# Patient Record
Sex: Female | Born: 1940 | Race: White | Hispanic: No | Smoking: Never smoker
Health system: Southern US, Community
[De-identification: ages and names within clinical notes are randomized; demographics above are authoritative.]

## PROBLEM LIST (undated history)

## (undated) ENCOUNTER — Inpatient Hospital Stay: Admission: EM | Payer: Self-pay | Source: Home / Self Care

## (undated) DIAGNOSIS — I999 Unspecified disorder of circulatory system: Secondary | ICD-10-CM

## (undated) DIAGNOSIS — I639 Cerebral infarction, unspecified: Secondary | ICD-10-CM

## (undated) DIAGNOSIS — E119 Type 2 diabetes mellitus without complications: Secondary | ICD-10-CM

## (undated) DIAGNOSIS — R569 Unspecified convulsions: Secondary | ICD-10-CM

## (undated) DIAGNOSIS — I10 Essential (primary) hypertension: Secondary | ICD-10-CM

## (undated) DIAGNOSIS — Z8619 Personal history of other infectious and parasitic diseases: Secondary | ICD-10-CM

## (undated) DIAGNOSIS — E785 Hyperlipidemia, unspecified: Secondary | ICD-10-CM

## (undated) DIAGNOSIS — E1149 Type 2 diabetes mellitus with other diabetic neurological complication: Secondary | ICD-10-CM

## (undated) DIAGNOSIS — C3490 Malignant neoplasm of unspecified part of unspecified bronchus or lung: Secondary | ICD-10-CM

## (undated) DIAGNOSIS — E1165 Type 2 diabetes mellitus with hyperglycemia: Principal | ICD-10-CM

## (undated) DIAGNOSIS — N189 Chronic kidney disease, unspecified: Secondary | ICD-10-CM

## (undated) DIAGNOSIS — M7989 Other specified soft tissue disorders: Secondary | ICD-10-CM

## (undated) DIAGNOSIS — K219 Gastro-esophageal reflux disease without esophagitis: Secondary | ICD-10-CM

## (undated) HISTORY — DX: Type 2 diabetes mellitus with other diabetic neurological complication: E11.49

## (undated) HISTORY — DX: Personal history of other infectious and parasitic diseases: Z86.19

## (undated) HISTORY — DX: Type 2 diabetes mellitus with hyperglycemia: E11.65

## (undated) HISTORY — PX: CATARACT EXTRACTION, BILATERAL: SHX1313

## (undated) HISTORY — DX: Essential (primary) hypertension: I10

## (undated) HISTORY — PX: UPPER GI ENDOSCOPY: SHX6162

## (undated) HISTORY — DX: Unspecified convulsions: R56.9

## (undated) HISTORY — PX: COLONOSCOPY: SHX174

## (undated) HISTORY — DX: Hyperlipidemia, unspecified: E78.5

## (undated) HISTORY — DX: Cerebral infarction, unspecified: I63.9

## (undated) HISTORY — DX: Type 2 diabetes mellitus without complications: E11.9

---

## 1978-07-06 HISTORY — PX: ABDOMINAL HYSTERECTOMY: SHX81

## 1978-07-06 HISTORY — PX: BLADDER SUSPENSION: SHX72

## 2003-07-07 DIAGNOSIS — R569 Unspecified convulsions: Secondary | ICD-10-CM

## 2003-07-07 HISTORY — DX: Unspecified convulsions: R56.9

## 2005-07-06 HISTORY — PX: APPENDECTOMY: SHX54

## 2005-07-06 HISTORY — PX: CHOLECYSTECTOMY: SHX55

## 2009-07-06 DIAGNOSIS — I639 Cerebral infarction, unspecified: Secondary | ICD-10-CM

## 2009-07-06 HISTORY — DX: Cerebral infarction, unspecified: I63.9

## 2010-07-06 HISTORY — PX: SPINE SURGERY: SHX786

## 2010-09-01 ENCOUNTER — Encounter (HOSPITAL_COMMUNITY)
Admission: RE | Admit: 2010-09-01 | Discharge: 2010-09-01 | Disposition: A | Discharge status: Home / Self Care | Payer: Medicare Other | Source: Ambulatory Visit | Attending: Neurosurgery | Admitting: Neurosurgery

## 2010-09-01 ENCOUNTER — Other Ambulatory Visit (HOSPITAL_COMMUNITY): Payer: Self-pay | Admitting: Neurosurgery

## 2010-09-01 ENCOUNTER — Ambulatory Visit (HOSPITAL_COMMUNITY)
Admission: RE | Admit: 2010-09-01 | Discharge: 2010-09-01 | Disposition: A | Discharge status: Home / Self Care | Payer: Medicare Other | Source: Ambulatory Visit | Attending: Neurosurgery | Admitting: Neurosurgery

## 2010-09-01 DIAGNOSIS — Z01810 Encounter for preprocedural cardiovascular examination: Secondary | ICD-10-CM | POA: Insufficient documentation

## 2010-09-01 DIAGNOSIS — I10 Essential (primary) hypertension: Secondary | ICD-10-CM | POA: Insufficient documentation

## 2010-09-01 DIAGNOSIS — M5416 Radiculopathy, lumbar region: Secondary | ICD-10-CM

## 2010-09-01 DIAGNOSIS — Z01812 Encounter for preprocedural laboratory examination: Secondary | ICD-10-CM | POA: Insufficient documentation

## 2010-09-01 DIAGNOSIS — IMO0002 Reserved for concepts with insufficient information to code with codable children: Secondary | ICD-10-CM | POA: Insufficient documentation

## 2010-09-01 LAB — BASIC METABOLIC PANEL
BUN: 22 mg/dL (ref 6–23)
CO2: 29 mEq/L (ref 19–32)
Calcium: 10.1 mg/dL (ref 8.4–10.5)
Chloride: 101 mEq/L (ref 96–112)
Creatinine, Ser: 1.5 mg/dL — ABNORMAL HIGH (ref 0.4–1.2)
GFR calc Af Amer: 42 mL/min — ABNORMAL LOW (ref >60)
GFR calc non Af Amer: 34 mL/min — ABNORMAL LOW (ref >60)
Glucose, Bld: 175 mg/dL — ABNORMAL HIGH (ref 70–99)
Potassium: 4.2 mEq/L (ref 3.5–5.1)
Sodium: 139 mEq/L (ref 135–145)

## 2010-09-01 LAB — CBC
HCT: 30.7 % — ABNORMAL LOW (ref 36.0–46.0)
Hemoglobin: 10.4 g/dL — ABNORMAL LOW (ref 12.0–15.0)
MCH: 28.5 pg (ref 26.0–34.0)
MCHC: 33.9 g/dL (ref 30.0–36.0)
MCV: 84.1 fL (ref 78.0–100.0)
Platelets: 375 10*3/uL (ref 150–400)
RBC: 3.65 MIL/uL — ABNORMAL LOW (ref 3.87–5.11)
RDW: 13.4 % (ref 11.5–15.5)
WBC: 9.4 10*3/uL (ref 4.0–10.5)

## 2010-09-01 LAB — SURGICAL PCR SCREEN
MRSA, PCR: NEGATIVE
Staphylococcus aureus: NEGATIVE

## 2010-09-03 ENCOUNTER — Inpatient Hospital Stay (HOSPITAL_COMMUNITY): Payer: Medicare Other

## 2010-09-03 ENCOUNTER — Inpatient Hospital Stay (HOSPITAL_COMMUNITY)
Admission: RE | Admit: 2010-09-03 | Discharge: 2010-09-08 | DRG: 460 | Disposition: A | Discharge status: Home / Self Care | Payer: Medicare Other | Source: Ambulatory Visit | Attending: Neurosurgery | Admitting: Neurosurgery

## 2010-09-03 DIAGNOSIS — M47817 Spondylosis without myelopathy or radiculopathy, lumbosacral region: Principal | ICD-10-CM | POA: Diagnosis present

## 2010-09-03 DIAGNOSIS — Q762 Congenital spondylolisthesis: Secondary | ICD-10-CM

## 2010-09-03 DIAGNOSIS — Z794 Long term (current) use of insulin: Secondary | ICD-10-CM

## 2010-09-03 DIAGNOSIS — R339 Retention of urine, unspecified: Secondary | ICD-10-CM | POA: Diagnosis present

## 2010-09-03 DIAGNOSIS — Z8673 Personal history of transient ischemic attack (TIA), and cerebral infarction without residual deficits: Secondary | ICD-10-CM

## 2010-09-03 DIAGNOSIS — Z7982 Long term (current) use of aspirin: Secondary | ICD-10-CM

## 2010-09-03 DIAGNOSIS — E1169 Type 2 diabetes mellitus with other specified complication: Secondary | ICD-10-CM | POA: Diagnosis present

## 2010-09-03 LAB — TYPE AND SCREEN
ABO/RH(D): B NEG
Antibody Screen: POSITIVE

## 2010-09-03 LAB — GLUCOSE, CAPILLARY
Glucose-Capillary: 216 mg/dL — ABNORMAL HIGH (ref 70–99)
Glucose-Capillary: 208 mg/dL — ABNORMAL HIGH (ref 70–99)
Glucose-Capillary: 203 mg/dL — ABNORMAL HIGH (ref 70–99)
Glucose-Capillary: 230 mg/dL — ABNORMAL HIGH (ref 70–99)
Glucose-Capillary: 147 mg/dL — ABNORMAL HIGH (ref 70–99)

## 2010-09-04 LAB — GLUCOSE, CAPILLARY
Glucose-Capillary: 239 mg/dL — ABNORMAL HIGH (ref 70–99)
Glucose-Capillary: 192 mg/dL — ABNORMAL HIGH (ref 70–99)
Glucose-Capillary: 121 mg/dL — ABNORMAL HIGH (ref 70–99)

## 2010-09-04 LAB — DIFFERENTIAL
Basophils Absolute: 0 10*3/uL (ref 0.0–0.1)
Basophils Relative: 0 % (ref 0–1)
Eosinophils Absolute: 0.1 10*3/uL (ref 0.0–0.7)
Eosinophils Relative: 1 % (ref 0–5)
Lymphocytes Relative: 7 % — ABNORMAL LOW (ref 12–46)
Lymphs Abs: 0.8 10*3/uL (ref 0.7–4.0)
Monocytes Absolute: 0.9 10*3/uL (ref 0.1–1.0)
Monocytes Relative: 8 % (ref 3–12)
Neutro Abs: 10.2 10*3/uL — ABNORMAL HIGH (ref 1.7–7.7)
Neutrophils Relative %: 85 % — ABNORMAL HIGH (ref 43–77)

## 2010-09-04 LAB — CBC
HCT: 27 % — ABNORMAL LOW (ref 36.0–46.0)
Hemoglobin: 8.8 g/dL — ABNORMAL LOW (ref 12.0–15.0)
MCH: 27.8 pg (ref 26.0–34.0)
MCHC: 32.6 g/dL (ref 30.0–36.0)
MCV: 85.4 fL (ref 78.0–100.0)
Platelets: 244 10*3/uL (ref 150–400)
RBC: 3.16 MIL/uL — ABNORMAL LOW (ref 3.87–5.11)
RDW: 13.7 % (ref 11.5–15.5)
WBC: 12 10*3/uL — ABNORMAL HIGH (ref 4.0–10.5)

## 2010-09-04 NOTE — Op Note (Signed)
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  PROCEDURE:  L3-L5 decompressive lumbar laminectomy, facetectomy, and foraminotomy with decompression of the exiting L3, L4, and L5 nerve roots with decompression beyond that required for interbody arthrodesis with microdissection and microsurgical technique.  Bilateral L3-4 and L4- 5 post lumbar interbody arthrodesis with AVS PEEK interbody implants, Vitoss bone marrow aspirate and infuse, and bilateral L3-L5 posterolateral arthrodesis with radius post instrumentation, Vitoss bone marrow aspirate and infuse.  SURGEON:  Hewitt Shorts, MD  ASSISTANT:  Clydene Fake, MD  ANESTHESIA:  General endotracheal.  INDICATIONS:  The patient is a 70 year old woman who presented with low back and right lumbar radicular pain.  She is found to have advanced degenerative change in the lower lumbar spine, worse at the L3-4 and L4- 5 levels with degenerative spondylolisthesis with resulting severe multifactorial stenosis at both the L3-4 and L4-5 levels.  Decision was made to proceed with decompression and stabilization.  PROCEDURE IN DETAIL:  The patient was brought to the operating room and placed on general endotracheal anesthesia.  The patient was  turned to her prone position.  Lumbar region was prepped with Betadine soap and solution and draped in a sterile fashion.  The midline was infiltrated with local anesthetic with epinephrine and midline incision was made, carried down through the subcutaneous tissue.  Bipolar cautery and electrocautery were used to maintain hemostasis.  Dissection was carried down to the lumbar fascia which was incised bilaterally in the paraspinal muscles from the spinous process and lamina in a subperiosteal fashion.  X-rays were taken.  The L3-4 and L4-5 interlaminar spaces were identified.  We then began the decompression performing a laminectomy using double-action rongeurs, the high-speed drill and Kerrison punches.  Care was taken to leave the underlying structures undisturbed.  The thickened ligamentum flavum was carefully removed, and we were able to decompress the spinal canal, thecal sac, and exiting nerve roots.  The facetectomy was extended laterally bilaterally at each level, and foraminotomy was performed for the exiting nerve roots, and we were able to decompress the L3, L4, and L5 nerve roots bilaterally.  We then identified the annulus.  The spondylolisthesis was noted at each level, worse at L4-5 then at L3-4.  At each level, we incised the annulus bilaterally and proceeded with a thorough diskectomy, spondylotic disk protrusion was removed so as to decompress the nerve roots as they exited through the neural foramen, and then we began to prepare the interbody space for  arthrodesis.  The microcurettes were used to remove the cartilaginous endplate surfaces, and then we measured the height of the intervertebral disk space and selected 8 x 25 x 4 degree implants.  Once the disk space and endplate preparation was completed, we draped the C-arm fluoroscope and was brought into the field, and we identified a pedicle entry point on the left-sided L4.  The pedicle was probed down to the  vertebral body.  We aspirated bone marrow aspirate from the vertebral body, which was injected over two 10 mL strips of Vitoss. Once those were saturated, we packed the PEEK implants with a combination of Vitoss infuse and more Vitoss.  We then first placed the implants at the L4-5, later at L3-4, then L4-5. The thecal sac and nerve root were gently retracted medially.  On the right side, the implant gently tamped into position.  We then went to the opposite side, packed the midline with a combination of infuse and Vitoss with bone marrow aspirate and then placed a second implant from the left side again retracting the thecal sac and nerve root and gently tamping into position.  Under direct vision, both implants were well countersunk.  We then packed some additional Vitoss bone marrow aspirate lateral to the implant on the right side.  The implants of Vitoss and infuse were packed similarly at the L3-4 level using an identical technique.  Once all four implants were in place, we then exposed the transverse process L3, L4, and L5 bilaterally, they were decorticated.  We then identified pedicle entry points for L3 bilaterally, the right-sided L4 and at L5 bilaterally. Once all four pedicles were probed, under C-arm fluoroscopic guidance, they reached tapped with a 5.25-mm tap, examined with a ball probe. Good threading was noted.  No bony cutouts were found and then we placed 5.75 x 40 mm screws bilaterally at L3 and L4 and 5.75 x 35 mm screws bilaterally at L5.  Once all six screws were in place, we then packed the lateral gutter overlying the transverse process of L3, L4, and L5, the intertransverse space at the L3-4 and L4-5 levels bilaterally with a infuse and Vitoss with bone marrow aspirate.  This was gently tamped down against the transverse processes.  We then selected two 50-mm pre- lordosed rods that were placed within the screw heads and secured with six locking caps.  Once  all the locking caps were in place, final tightening was performed against the counter torque.  We then selected a medium crosslink.  It was secured to the rods between the L4 and L5 screws locked down to the rods and then tightened in the middle.  The wound had been irrigated numerous times throughout the procedure with saline solution and bacitracin solution.  Good hemostasis was established, and then we proceeded with closure.  Paraspinal muscle was approximated interrupted undyed 1 Vicryl sutures.  The deep fascia with interrupted undyed 1 Vicryl sutures.  Scarpa fascia was closed with interrupted undyed #1 Vicryl sutures.  The subcutaneous and subcuticular were closed with interrupted inverted 2-0 undyed Vicryl sutures.  The skin was approximated with Dermabond and then some of that was dressed with a dressing sponges and 4-inch Hypafix.  The procedure was tolerated well.  The estimated blood loss was 400 mL.  We were able to return 100 mL of cell saver blood to the patient.  She arrived anemic and during the procedure, the anemia had increasing, and therefore she was given 2 units of packed  red blood cells, which were tolerated well, and her vital signs remained stable through the procedure.  Following surgery, the patient was turned back to supine position, to be reversed from the anesthetic, extubated, and transferred to the recovery room for further care.     Hewitt Shorts, M.D.    RWN/MEDQ  D:  09/03/2010  T:  09/04/2010  Job:  [id number]  Electronically Signed by Shirlean Kelly M.D. on 09/04/2010 07:58:56 AM

## 2010-09-05 LAB — POCT I-STAT 4, (NA,K, GLUC, HGB,HCT)
Glucose, Bld: 193 mg/dL — ABNORMAL HIGH (ref 70–99)
Glucose, Bld: 189 mg/dL — ABNORMAL HIGH (ref 70–99)
Glucose, Bld: 213 mg/dL — ABNORMAL HIGH (ref 70–99)
HCT: 22 % — ABNORMAL LOW (ref 36.0–46.0)
HCT: 22 % — ABNORMAL LOW (ref 36.0–46.0)
HCT: 25 % — ABNORMAL LOW (ref 36.0–46.0)
Hemoglobin: 7.5 g/dL — ABNORMAL LOW (ref 12.0–15.0)
Hemoglobin: 7.5 g/dL — ABNORMAL LOW (ref 12.0–15.0)
Hemoglobin: 8.5 g/dL — ABNORMAL LOW (ref 12.0–15.0)
Potassium: 3.9 mEq/L (ref 3.5–5.1)
Potassium: 4.1 mEq/L (ref 3.5–5.1)
Potassium: 4.7 mEq/L (ref 3.5–5.1)
Sodium: 138 mEq/L (ref 135–145)
Sodium: 138 mEq/L (ref 135–145)
Sodium: 136 mEq/L (ref 135–145)

## 2010-09-05 LAB — GLUCOSE, CAPILLARY
Glucose-Capillary: 96 mg/dL (ref 70–99)
Glucose-Capillary: 146 mg/dL — ABNORMAL HIGH (ref 70–99)
Glucose-Capillary: 86 mg/dL (ref 70–99)
Glucose-Capillary: 117 mg/dL — ABNORMAL HIGH (ref 70–99)
Glucose-Capillary: 60 mg/dL — ABNORMAL LOW (ref 70–99)
Glucose-Capillary: 65 mg/dL — ABNORMAL LOW (ref 70–99)
Glucose-Capillary: 143 mg/dL — ABNORMAL HIGH (ref 70–99)

## 2010-09-06 LAB — URINALYSIS, ROUTINE W REFLEX MICROSCOPIC
Bilirubin Urine: NEGATIVE
Glucose, UA: NEGATIVE mg/dL
Hgb urine dipstick: NEGATIVE
Ketones, ur: NEGATIVE mg/dL
Nitrite: NEGATIVE
Protein, ur: NEGATIVE mg/dL
Specific Gravity, Urine: 1.013 (ref 1.005–1.030)
Urobilinogen, UA: 0.2 mg/dL (ref 0.0–1.0)
pH: 5.5 (ref 5.0–8.0)

## 2010-09-06 LAB — GLUCOSE, CAPILLARY
Glucose-Capillary: 211 mg/dL — ABNORMAL HIGH (ref 70–99)
Glucose-Capillary: 243 mg/dL — ABNORMAL HIGH (ref 70–99)

## 2010-09-07 LAB — TYPE AND SCREEN
ABO/RH(D): B NEG
Antibody Screen: POSITIVE
DAT, IgG: POSITIVE
Unit division: 0
Unit division: 0
Unit division: 0
Unit division: 0
Unit division: 0
Unit division: 0

## 2010-09-07 LAB — GLUCOSE, CAPILLARY
Glucose-Capillary: 142 mg/dL — ABNORMAL HIGH (ref 70–99)
Glucose-Capillary: 128 mg/dL — ABNORMAL HIGH (ref 70–99)
Glucose-Capillary: 114 mg/dL — ABNORMAL HIGH (ref 70–99)
Glucose-Capillary: 152 mg/dL — ABNORMAL HIGH (ref 70–99)
Glucose-Capillary: 178 mg/dL — ABNORMAL HIGH (ref 70–99)

## 2010-09-08 LAB — URINE CULTURE
Colony Count: NO GROWTH
Culture  Setup Time: 201203040108
Culture: NO GROWTH

## 2010-09-08 LAB — GLUCOSE, CAPILLARY
Glucose-Capillary: 160 mg/dL — ABNORMAL HIGH (ref 70–99)
Glucose-Capillary: 154 mg/dL — ABNORMAL HIGH (ref 70–99)
Glucose-Capillary: 158 mg/dL — ABNORMAL HIGH (ref 70–99)

## 2010-09-09 NOTE — Discharge Summary (Signed)
NAME:  Michaela Shaw, Michaela Shaw                   ACCOUNT NO.:  [id number]  MEDICAL RECORD NO.:  [id number]           PATIENT TYPE:  I  LOCATION:  3032                         FACILITY:  [facility]  PHYSICIAN:  Hewitt Shorts, M.D.DATE OF BIRTH:  31-Jul-1940  DATE OF ADMISSION:  09/03/2010 DATE OF DISCHARGE:  09/08/2010                              DISCHARGE SUMMARY   HISTORY AND PHYSICAL EXAMINATION:  The patient is a 70 year old woman with history of low back and right lumbar radicular pain worsening symptoms of the past several years primarily right buttock, lateral hip, thigh and leg pain.  It is the worst pain that was across her back.  She does not describe any radicular symptoms to the left lower extremity. She does not describe any numbness, paresthesias or weakness.  She has been using Advil and Percocet for the pain.  X-ray showed a grade 1-2 dynamic degenerative spondylolisthesis of L3-L4 and a grade 2 dynamic degenerative spondylolisthesis of L4-5 was advanced with facet arthropathy throughout the lower lumbar spine and there was severe multifactorial stenosis at the L3-4 and L4-5 levels.  Past history was notable for diabetes for 8 years, hypertension for 5 years, stroke in July 2011, hypercholesterolemia and anemia.  She denies any history of myocardial function, cancer, peptic ulcer disease or lung disease.  Previous surgeries include 2 bladder surgeries, cholecystectomy and hysterectomy.  No allergies.  Medications are listed in her admission note.  REVIEW OF SYSTEMS:  Regarding bladder function, she had longstanding urinary frequency, voiding several times each hour.  PHYSICAL EXAMINATION:  GENERAL:  This patient is a well-developed, well- nourished white female in no distress. LUNGS:  Clear. HEART:  Regular rate and left.  Normal S1 and S2.  There is no murmur. NEUROLOGIC:  Showed 5/5 strength.  HOSPITAL COURSE:  The patient was admitted, underwent an  L3-L5 decompressive lumbar laminectomy and L3-4 and L4-5 posterior lumbar interbody fusion and posterolateral arthrodesis with interbody implant, post instrumentation and bone graft.  Following surgery, from the neurosurgical perspective, she has done quite well.  She was able to mobilize fairly quickly, was able to don and doff her lumbar brace without difficulty.  Her wound has healed nicely.  She has been afebrile.  However, she has had most significantly difficulties with urinary retention with significant bladder volumes and required in-and- out catheterization.  Eventually, she had to have Foley catheter replaced and the bladder was allowed to rest.  Another voiding trial was performed, still having significant residuals and therefore the Foley was placed again last night.  Urinalysis and urine culture were sent 2 days ago.  Urinalysis was negative.  Urine culture showed no growth after 2 days of final report.  Discussed her case with Dr. Jethro Bolus the urologist on-call today.  He advised placing the Foley to her leg bag and having her follow up with him in his office this week and we have given the patient his name, phone number, and instructions.  As far as her back is concerned, she is to be walking at home every day, avoiding bending, lifting and twisting.  She is  to wear brace when she is up and about.  She is allowed to shower and the glue that closed the incision is to be removed within a week or so.  She has had mild episodes of hypoglycemia, no significant hyperglycemia and she was advised to keep a close eye on her Accu-Cheks and to follow up with her primary physician regarding her blood sugar management.  Discharge prescription was given for Percocet 1-2 tablets p.o. q.i.d. p.r.n. pain, 60 tablets, no refills, and discharge follow up with me is in 3-4 weeks with x-rays in my office.  DISCHARGE DIAGNOSES: 1. Lumbar stenosis, multilevel dynamic  degenerative spondylolisthesis,     lumbar spondylosis, lumbar degenerative disk disease. 2. Urinary retention, probably significant preoperative urinary     retention with overflow voiding. 3. Diabetes with mild hypoglycemic findings at times during this     admission. 4. History of hypertension, stroke, hypercholesterolemia and anemia.  It should be noted the patient did receive 2 units of packed red blood cells at the time of surgery, but has had stable blood counts following surgery.     Hewitt Shorts, M.D.     RWN/MEDQ  D:  09/08/2010  T:  09/09/2010  Job:  [id number]  cc:   Lynelle Smoke I. Patsi Sears, M.D.  Electronically Signed by Shirlean Kelly M.D. on 09/09/2010 07:32:49 AM

## 2010-09-09 NOTE — H&P (Signed)
NAME:  Michaela Shaw, Michaela Shaw NO.:  [id number]  MEDICAL RECORD NO.:  [id number]           PATIENT TYPE:  I  LOCATION:  3033                         FACILITY:  [facility]  PHYSICIAN:  Hewitt Shorts, M.D.DATE OF BIRTH:  1941-01-06  DATE OF ADMISSION:  09/03/2010 DATE OF DISCHARGE:                             HISTORY & PHYSICAL   HISTORY OF PRESENT ILLNESS:  The patient is a 70 year old right-hand white female who is evaluated for low back and right lumbar radicular pain.  She has been having worsening symptoms over the past several years primarily to the right buttock, lateral right hip, thigh, and leg. It is worst pain across the low back.  She does not describe any radicular symptoms of the left lower extremity.  She does not describe any numbness, paresthesias, or weakness.  She has been using Percocet and Advil for the pain.  She was evaluated with x-rays and MRI scan.  Those showed a grade 1-2 degenerative spondylosis of L3 and L4 and grade 2 degenerative spondylosis at L4-5, both are dynamic through flexion and extension, increasing with flexion and diminishing with extension. There is advanced facet arthropathy through the lower lumbar spine.  MRI scan reconfirms the 2-level degenerative spondylolisthesis with severe multifactorial lumbar stenosis of the spinal canal at the L3-4 and L4-5 levels.  The patient is admitted now for decompression and stabilization of the lumbar spine.  PAST MEDICAL HISTORY:  Notable for history of diabetes for the past 8 years, history of hypertension for the past 5 years, history of stroke in July 2011, history of hypercholesterolemia, and a history of anemia. She denies any history of myocardial function, cancer, peptic ulcer disease, or lung disease.  PREVIOUS SURGERY:  Two bladder surgeries, cholecystectomy, and hysterectomy.  ALLERGIES:  She denies allergies to medications.  MEDICATIONS AT TIME OF ADMISSION: 1. Humalog  75/25, 86 units q.a.m. and 48 units q.p.m. 2. Pravachol 80 mg nightly. 3. Aspirin 325 mg daily. 4. Clonidine 0.1 mg b.i.d. 5. Exforge/HCTZ 5/160/25 daily. 6. Lasix 40 mg daily. 7. Fenofibrate 45 mg daily. 8. Alprazolam 20 mg daily. 9. Vitamin B12 1000 mcg IM every month. 10.Lovaza 1 g 4 capsules daily.  FAMILY HISTORY:  Parents passed on.  Mother had a myocardial function and diabetes.  Both parents died in their mid 15s.  SOCIAL HISTORY:  The patient is retired.  She is divorced.  She does not smoke, drink alcoholic beverages, or have history of substance abuse.  REVIEW OF SYSTEMS:  Notable for those described in the history of present illness and past medical history.  Review of systems is otherwise unremarkable.  PHYSICAL EXAMINATION:  GENERAL:  The patient is a well-developed, well- nourished, white female in no acute distress. VITAL SIGNS:  Her temperature is 98, pulse 74, blood pressure 159/81, respiratory rate 20, height 4 feet 11 inches, and weight 160 pounds. LUNGS:  Clear to auscultation.  She has symmetric respiratory excursion. HEART:  Regular rate and rhythm.  Normal S1 and S2.  There is no murmur. EXTREMITIES:  No clubbing, cyanosis, or edema. MUSCULOSKELETAL:  No tenderness to palpation  over the lumbar spinous process or paralumbar musculature.  She is able to flex beyond 90 degrees, able to extend beyond 10 degrees.  Straight leg raising is negative bilaterally. NEUROLOGIC:  5/5 strength in the lower extremities including the iliopsoas, quadriceps, dorsiflexors, extensor hallucis longus, and plantarflexor bilaterally.  Sensation is intact to pinprick.  Through the distal lower extremities, reflexes are 2+ at the quadriceps, minimal at the gastroenteritis that are symmetrical bilaterally.  Toes are downgoing bilaterally.  She has normal gait and stance.  IMPRESSION:  Worsening low back and right lumbar radicular pain with intact strength and sensation, but with  severe degeneration at the L3-4 and L4-5 levels with dynamic degenerative spondylolisthesis at each level with associated severe stenosis at each level.  PLAN:  The patient is admitted for L3-L5 decompressive lumbar laminectomy and L3-4 and L4-5 posterior lumbar interbody arthrodesis and possible arthrodesis with interbody implants, posterior instrumentation, and bone graft.  I discussed the nature of her condition, the nature of the surgical procedure, alternatives to the surgical procedure, typical risks of surgery, hospital stay, and overall recuperation and limitations postoperatively, need for postop immobilization and lumbar brace, and risks including this infection, bleeding, possible need for transfusion, risks of nerve dysfunction, pain, weakness, and paresthesias, the risk of dural tear, CSF leak, possible need for further surgery, the risk of failure of the arthrodesis, and possible need for further surgery, anesthetic risk, myocardial infarction, stroke, and death.  Understanding all this, she wishes to proceed with surgery and is admitted for such.     Hewitt Shorts, M.D.     RWN/MEDQ  D:  09/05/2010  T:  09/05/2010  Job:  [id number]  Electronically Signed by Shirlean Kelly M.D. on 09/09/2010 07:32:46 AM

## 2010-09-10 LAB — GLUCOSE, CAPILLARY: Glucose-Capillary: 270 mg/dL — ABNORMAL HIGH (ref 70–99)

## 2010-09-12 ENCOUNTER — Emergency Department (HOSPITAL_COMMUNITY)
Admission: EM | Admit: 2010-09-12 | Discharge: 2010-09-13 | Disposition: A | Discharge status: Home / Self Care | Payer: Medicare Other | Attending: Emergency Medicine | Admitting: Emergency Medicine

## 2010-09-12 DIAGNOSIS — Z794 Long term (current) use of insulin: Secondary | ICD-10-CM | POA: Insufficient documentation

## 2010-09-12 DIAGNOSIS — I10 Essential (primary) hypertension: Secondary | ICD-10-CM | POA: Insufficient documentation

## 2010-09-12 DIAGNOSIS — E781 Pure hyperglyceridemia: Secondary | ICD-10-CM | POA: Insufficient documentation

## 2010-09-12 DIAGNOSIS — R339 Retention of urine, unspecified: Secondary | ICD-10-CM | POA: Insufficient documentation

## 2010-09-12 DIAGNOSIS — E119 Type 2 diabetes mellitus without complications: Secondary | ICD-10-CM | POA: Insufficient documentation

## 2010-09-12 DIAGNOSIS — E7800 Pure hypercholesterolemia, unspecified: Secondary | ICD-10-CM | POA: Insufficient documentation

## 2010-09-12 DIAGNOSIS — N390 Urinary tract infection, site not specified: Secondary | ICD-10-CM | POA: Insufficient documentation

## 2010-09-12 LAB — URINE MICROSCOPIC-ADD ON

## 2010-09-12 LAB — URINALYSIS, ROUTINE W REFLEX MICROSCOPIC
Bilirubin Urine: NEGATIVE
Glucose, UA: NEGATIVE mg/dL
Nitrite: NEGATIVE
Specific Gravity, Urine: 1.01 (ref 1.005–1.030)
pH: 7 (ref 5.0–8.0)

## 2010-09-15 LAB — URINE CULTURE

## 2013-02-28 ENCOUNTER — Ambulatory Visit: Payer: Medicare Other | Admitting: Family

## 2013-03-03 ENCOUNTER — Ambulatory Visit (INDEPENDENT_AMBULATORY_CARE_PROVIDER_SITE_OTHER): Payer: Medicare Other | Admitting: Family

## 2013-03-03 ENCOUNTER — Encounter: Payer: Self-pay | Admitting: Family

## 2013-03-03 VITALS — BP 150/72 | HR 79 | Temp 97.9°F | Resp 16 | Ht 59.0 in | Wt 157.1 lb

## 2013-03-03 DIAGNOSIS — E119 Type 2 diabetes mellitus without complications: Secondary | ICD-10-CM

## 2013-03-03 DIAGNOSIS — N390 Urinary tract infection, site not specified: Secondary | ICD-10-CM

## 2013-03-03 DIAGNOSIS — E785 Hyperlipidemia, unspecified: Secondary | ICD-10-CM

## 2013-03-03 DIAGNOSIS — Z23 Encounter for immunization: Secondary | ICD-10-CM

## 2013-03-03 DIAGNOSIS — I10 Essential (primary) hypertension: Secondary | ICD-10-CM

## 2013-03-03 DIAGNOSIS — IMO0001 Reserved for inherently not codable concepts without codable children: Secondary | ICD-10-CM

## 2013-03-03 DIAGNOSIS — R3989 Other symptoms and signs involving the genitourinary system: Secondary | ICD-10-CM

## 2013-03-03 LAB — BASIC METABOLIC PANEL
Calcium: 9.1 mg/dL (ref 8.4–10.5)
Creat: 1.01 mg/dL (ref 0.50–1.10)

## 2013-03-03 LAB — HEPATIC FUNCTION PANEL
Bilirubin, Direct: 0.1 mg/dL (ref 0.0–0.3)
Indirect Bilirubin: 0.1 mg/dL (ref 0.0–0.9)
Total Bilirubin: 0.2 mg/dL — ABNORMAL LOW (ref 0.3–1.2)

## 2013-03-03 MED ORDER — VALSARTAN-HYDROCHLOROTHIAZIDE 160-25 MG PO TABS: 1.0000 | ORAL_TABLET | Freq: Every day | ORAL | Status: DC

## 2013-03-03 NOTE — Patient Instructions (Addendum)
Please increase your humalog 75/25 to 48 units twice daily.   Stop Exforge, start diovan-hct. Continue clonidine. Follow up in 3 months for a wellness visit.

## 2013-03-03 NOTE — Progress Notes (Signed)
Subjective:    Patient ID: Michaela Shaw, female    DOB: 01/08/1941, 72 y.o.   MRN: [id number]  HPI  Michaela Shaw is a 72 yr old female who presents today to establish care.   Reports that she had hx 2nd toe fracture left foot several months ago.  She reports that she had a LLE doppler which was reportedly negative. Was told that she had water behind the knee.  Had a fall just around that time.  She has had   She is concerned re: swelling in the left ankle for several months.  She also reports pain in the right ankle since she broke a toe.   DM2- Currently maintained on humalog mix. She is using 46 units of the humalog mix bid. Increased 6 weeks ago.  She is on a study drug.  She is on the medication every Wednesday morning.  She was diagnosed in 2001.  simaglutide vs placebo.  6/7- her   8.5.  Reports that her AM sugars have been running 250-300.    HTN-  Currently maintained on exforge and clonidine.  Reports that her blood her blood pressure is generally uncontrolled despite medication.  She has been out of exforge x 2 weeks without improvement in her LE edema.    Hyperlipidemia- currently maintained on pravastatin.     Review of Systems  Constitutional: Positive for unexpected weight change.  HENT: Negative for congestion.   Eyes: Negative for visual disturbance.  Respiratory: Negative for cough.   Cardiovascular: Positive for leg swelling. Negative for chest pain.  Gastrointestinal: Negative for nausea, vomiting and diarrhea.  Genitourinary: Negative for frequency.       + urinary pressure this AM  Musculoskeletal: Negative for myalgias and arthralgias.  Skin: Negative for rash.  Neurological: Negative for headaches.  Hematological: Negative for adenopathy.  Psychiatric/Behavioral:       Denies depression/anxiety   Past Medical History  Diagnosis Date  . History of chicken pox   . Diabetes mellitus without complication   . Hyperlipidemia   . Hypertension   . Stroke 2011     "mild" per pt.   . Seizures 2005    History   Social History  . Marital Status: Legally Separated    Spouse Name: N/A    Number of Children: N/A  . Years of Education: N/A   Occupational History  . Not on file.   Social History Main Topics  . Smoking status: Never Smoker   . Smokeless tobacco: Never Used  . Alcohol Use: No  . Drug Use: Not on file  . Sexual Activity: Not on file   Other Topics Concern  . Not on file   Social History Narrative   Son living with her, lives with daughter   RetiredBarrister's clerk, factory work- Arts development officer   Enjoys crafts shows   Completed 11 th grade   3 grand children                Past Surgical History  Procedure Laterality Date  . Bladder suspension  1980  . Abdominal hysterectomy  1980    partial  . Cholecystectomy  2007  . Appendectomy  2007  . Spine surgery  2012    Has rods and pins    Family History  Problem Relation Age of Onset  . Heart disease Mother   . Diabetes Mother   . Alzheimer's disease Father   . Cancer Sister     colon  . Diabetes  Sister   . Diabetes Daughter   . Diabetes Sister     No Known Allergies  No current outpatient prescriptions on file prior to visit.   No current facility-administered medications on file prior to visit.    BP 150/72  Pulse 79  Temp(Src) 97.9 F (36.6 C) (Oral)  Resp 16  Ht 4\' 11"  (1.499 m)  Wt 157 lb 1.3 oz (71.251 kg)  BMI 31.71 kg/m2  SpO2 96%  LMP 07/06/1978       Objective:   Physical Exam  Constitutional: She is oriented to person, place, and time. She appears well-developed and well-nourished. No distress.  HENT:  Head: Normocephalic and atraumatic.  Eyes: Conjunctivae are normal.  Cardiovascular: Normal rate and regular rhythm.   No murmur heard. Pulmonary/Chest: Effort normal and breath sounds normal. No respiratory distress. She has no wheezes. She has no rales. She exhibits no tenderness.  Musculoskeletal:  + swelling left ankle.   Lymphadenopathy:    She has no cervical adenopathy.  Neurological: She is alert and oriented to person, place, and time.  Psychiatric: She has a normal mood and affect. Her behavior is normal. Thought content normal.          Assessment & Plan:

## 2013-03-04 LAB — MICROALBUMIN / CREATININE URINE RATIO
Creatinine, Urine: 179.6 mg/dL
Microalb Creat Ratio: 103.3 mg/g — ABNORMAL HIGH (ref 0.0–30.0)

## 2013-03-05 ENCOUNTER — Telehealth: Payer: Self-pay | Admitting: Family

## 2013-03-05 DIAGNOSIS — E1165 Type 2 diabetes mellitus with hyperglycemia: Secondary | ICD-10-CM

## 2013-03-05 DIAGNOSIS — IMO0002 Reserved for concepts with insufficient information to code with codable children: Secondary | ICD-10-CM | POA: Insufficient documentation

## 2013-03-05 DIAGNOSIS — E1149 Type 2 diabetes mellitus with other diabetic neurological complication: Secondary | ICD-10-CM | POA: Insufficient documentation

## 2013-03-05 HISTORY — DX: Type 2 diabetes mellitus with hyperglycemia: E11.65

## 2013-03-05 HISTORY — DX: Reserved for concepts with insufficient information to code with codable children: IMO0002

## 2013-03-05 MED ORDER — CIPROFLOXACIN HCL 250 MG PO TABS: 250.0000 mg | ORAL_TABLET | Freq: Two times a day (BID) | ORAL | Status: DC

## 2013-03-05 NOTE — Telephone Encounter (Signed)
Urine culture is pos for e coli. Will rx with cipro.

## 2013-03-05 NOTE — Telephone Encounter (Signed)
pls notify pt. As below.

## 2013-03-05 NOTE — Assessment & Plan Note (Signed)
Increase humalog mix from 46 to 48 units bid. Obtain urine microalbumin.

## 2013-03-06 LAB — URINE CULTURE: Colony Count: >=100000

## 2013-03-07 DIAGNOSIS — N390 Urinary tract infection, site not specified: Secondary | ICD-10-CM | POA: Insufficient documentation

## 2013-03-07 DIAGNOSIS — E785 Hyperlipidemia, unspecified: Secondary | ICD-10-CM | POA: Insufficient documentation

## 2013-03-07 DIAGNOSIS — I10 Essential (primary) hypertension: Secondary | ICD-10-CM | POA: Insufficient documentation

## 2013-03-07 NOTE — Assessment & Plan Note (Signed)
Tolerating statin, continue same. LFT normal.

## 2013-03-07 NOTE — Telephone Encounter (Signed)
LM @ (1:44pm) asking the pt to RTC regarding urine culture results.//AB/CMA

## 2013-03-07 NOTE — Assessment & Plan Note (Signed)
Deteriorated.  Stop Exforge, start diovan-hct. Continue clonidine. Hopefully left ankle edema will improve with removal of amlodipine and addition of diuretic.

## 2013-03-07 NOTE — Assessment & Plan Note (Signed)
Urine culture + for e. Coli, will rx with cipro.  See phone note.

## 2013-03-08 ENCOUNTER — Telehealth: Payer: Self-pay | Admitting: *Deleted

## 2013-03-08 MED ORDER — INSULIN LISPRO PROT & LISPRO (75-25 MIX) 100 UNIT/ML ~~LOC~~ SUSP: 48.0000 [IU] | Freq: Two times a day (BID) | SUBCUTANEOUS | Status: DC

## 2013-03-08 NOTE — Telephone Encounter (Signed)
Marj, pt established care with Melissa on 03/03/13 and was advised wellness exam in 3 months. I see that pt was scheduled on Dr Abner Greenspan for this in December. Can you verify this? If pt is wanting to change to Dr Abner Greenspan can you please send a note to both Providers that pt is wanting to change.  Thanks.  Notified pt and she voices understanding. Pt also requests refill of insulin. Refill sent.

## 2013-03-08 NOTE — Telephone Encounter (Signed)
Received message from pt wanting to know if the new BP medication was in addition to what she was previously taking or is it replacing it?  Attempted to reach pt and left message on home # that diovan-hct is replacing exforge and to call if any further questions.

## 2013-04-01 ENCOUNTER — Other Ambulatory Visit: Payer: Self-pay | Admitting: Family

## 2013-04-03 NOTE — Telephone Encounter (Signed)
Rx refill request Denied; Last Rx 08.29.14, #30x1-Too Soon for Refill request/SLS

## 2013-04-17 ENCOUNTER — Telehealth: Payer: Self-pay | Admitting: *Deleted

## 2013-04-17 MED ORDER — VALSARTAN-HYDROCHLOROTHIAZIDE 160-25 MG PO TABS: 1.0000 | ORAL_TABLET | Freq: Every day | ORAL | Status: DC

## 2013-04-17 MED ORDER — CLONIDINE HCL 0.2 MG PO TABS: 0.2000 mg | ORAL_TABLET | Freq: Two times a day (BID) | ORAL | Status: DC

## 2013-04-17 MED ORDER — OMEPRAZOLE 20 MG PO CPDR: 20.0000 mg | DELAYED_RELEASE_CAPSULE | ORAL | Status: DC

## 2013-04-17 MED ORDER — PRAVASTATIN SODIUM 80 MG PO TABS: 80.0000 mg | ORAL_TABLET | Freq: Every day | ORAL | Status: DC

## 2013-04-17 NOTE — Telephone Encounter (Signed)
Rx request to pharmacy/SLS  

## 2013-04-17 NOTE — Telephone Encounter (Signed)
Received message from pt that valsartan hctz is out of refills and next appt is not until December. Refills sent. Left message from pt to return my call.

## 2013-04-21 ENCOUNTER — Other Ambulatory Visit: Payer: Self-pay | Admitting: Family

## 2013-04-21 NOTE — Telephone Encounter (Signed)
Rx request 

## 2013-04-21 NOTE — Telephone Encounter (Signed)
Received message from pt that the pharmacy did not receive her prescription that I previously told her we had sent.  Upon review of her record I see 4 prescriptions that we refilled on 04/17/13 and eRx status still shows "sent to pharmacy". Gave verbal to Sam per 04/17/13 refills and left message on pt's voicemail to return my call re: status.

## 2013-06-06 ENCOUNTER — Ambulatory Visit (INDEPENDENT_AMBULATORY_CARE_PROVIDER_SITE_OTHER): Payer: Medicare Other | Admitting: Family

## 2013-06-06 ENCOUNTER — Ambulatory Visit: Payer: Medicare Other | Admitting: Family Medicine

## 2013-06-06 ENCOUNTER — Other Ambulatory Visit: Payer: Self-pay | Admitting: Family

## 2013-06-06 ENCOUNTER — Encounter: Payer: Self-pay | Admitting: Family

## 2013-06-06 VITALS — BP 120/64 | HR 63 | Temp 97.8°F | Resp 16 | Ht 59.0 in | Wt 157.0 lb

## 2013-06-06 DIAGNOSIS — Z23 Encounter for immunization: Secondary | ICD-10-CM

## 2013-06-06 DIAGNOSIS — Z1231 Encounter for screening mammogram for malignant neoplasm of breast: Secondary | ICD-10-CM

## 2013-06-06 DIAGNOSIS — I10 Essential (primary) hypertension: Secondary | ICD-10-CM

## 2013-06-06 DIAGNOSIS — Z13820 Encounter for screening for osteoporosis: Secondary | ICD-10-CM

## 2013-06-06 DIAGNOSIS — Z0000 Encounter for general adult medical examination without abnormal findings: Secondary | ICD-10-CM

## 2013-06-06 DIAGNOSIS — E785 Hyperlipidemia, unspecified: Secondary | ICD-10-CM

## 2013-06-06 DIAGNOSIS — E119 Type 2 diabetes mellitus without complications: Secondary | ICD-10-CM

## 2013-06-06 DIAGNOSIS — Z1239 Encounter for other screening for malignant neoplasm of breast: Secondary | ICD-10-CM

## 2013-06-06 DIAGNOSIS — IMO0001 Reserved for inherently not codable concepts without codable children: Secondary | ICD-10-CM

## 2013-06-06 LAB — BASIC METABOLIC PANEL WITH GFR
BUN: 24 mg/dL — ABNORMAL HIGH (ref 6–23)
CO2: 25 mEq/L (ref 19–32)
GFR, Est African American: 39 mL/min — ABNORMAL LOW
GFR, Est Non African American: 33 mL/min — ABNORMAL LOW
Potassium: 5.1 mEq/L (ref 3.5–5.3)
Sodium: 137 mEq/L (ref 135–145)

## 2013-06-06 LAB — LIPID PANEL
HDL: 28 mg/dL — ABNORMAL LOW (ref >39)
Total CHOL/HDL Ratio: 5.3 Ratio
Triglycerides: 543 mg/dL — ABNORMAL HIGH (ref <150)

## 2013-06-06 LAB — HEPATIC FUNCTION PANEL
ALT: 15 U/L (ref 0–35)
Bilirubin, Direct: 0.1 mg/dL (ref 0.0–0.3)
Indirect Bilirubin: 0.3 mg/dL (ref 0.0–0.9)
Total Bilirubin: 0.4 mg/dL (ref 0.3–1.2)
Total Protein: 6.4 g/dL (ref 6.0–8.3)

## 2013-06-06 LAB — HEMOGLOBIN A1C: Hgb A1c MFr Bld: 9.3 % — ABNORMAL HIGH (ref <5.7)

## 2013-06-06 MED ORDER — LISINOPRIL 2.5 MG PO TABS: 2.5000 mg | ORAL_TABLET | Freq: Every day | ORAL | Status: DC

## 2013-06-06 NOTE — Patient Instructions (Addendum)
Schedule bone density at the front desk. Schedule mammogram on the first floor.  Complete lab work prior to leaving. Try to exercise regularly- goal is 30 minutes 5 days a week.  Slowly work up to this. Follow up in 1 month for nurse visit for blood pressure check and lab work since you are starting lisinopril. Follow up in 3 months for routine office visit.

## 2013-06-06 NOTE — Assessment & Plan Note (Signed)
Check flp/lft, tolerating statin.

## 2013-06-06 NOTE — Assessment & Plan Note (Signed)
Sugars running high per pt.  Continue insulin, obtain A1C.

## 2013-06-06 NOTE — Assessment & Plan Note (Signed)
BP stable on current meds.  Continue current meds. Was going to add lisinopril for renal protection, but see that she is already on ARB. Cancelled rx at [facility] and notified pt not to start lisinopril and to follow up in 3 months.

## 2013-06-06 NOTE — Progress Notes (Signed)
Patient ID: Michaela Shaw, female   DOB: Feb 18, 1941, 72 y.o.   MRN: [id number]   Subjective:   Patient here for Medicare annual wellness visit and management of other chronic and acute problems.  DM2- has an eye apt tomorrow.  Reports that sugars have been running high. Participating in a novo nortis study at [facility].   GERD- reports well controlled on omeprazole.   Hyperlipidemia- denies myalgia on pravastatin.  HTN- Denies CP.  Reports some SOB which she attributes to obesity  Immunizations: tetanus due, pneumovax Diet: not eating a healthy diet. Reports she does not eat a enough- "picky eater" per daughter Exercise: goes to gym sometimes- not enough.  Colonoscopy: up to date- neg 2-3 years ago per patient Dexa: due Pap Smear: she reports partial hystectomy Mammogram: due  Risk factors: at risk for CAD due to DM/Hyperlipidemia/obesity  Roster of Physicians Providing Medical Care to Patient:  Activities of Daily Living  In your present state of health, do you have any difficulty performing the following activities? Preparing food and eating?: No  Bathing yourself: No  Getting dressed: No  Using the toilet:No  Moving around from place to place: No  In the past year have you fallen or had a near fall?:No    Home Safety: Has smoke detector and wears seat belts. No firearms. No excess sun exposure.  Diet and Exercise  Current exercise habits:  None.   Dietary issues discussed: healthy diet   Depression Screen  (Note: if answer to either of the following is "Yes", then a more complete depression screening is indicated)  Q1: Over the past two weeks, have you felt down, depressed or hopeless?no  Q2: Over the past two weeks, have you felt little interest or pleasure in doing things? no   The following portions of the patient's history were reviewed and updated as appropriate: allergies, current medications, past family history, past medical history, past social history, past  surgical history and problem list.    Objective:   Vision: see nursing Hearing: not able to hear forced whisper- declines audiology referral Body mass index: see nursing Cognitive Impairment Assessment: cognition, memory and judgment appear normal.   Physical Exam  Constitutional: She is oriented to person, place, and time. She appears well-developed and well-nourished. No distress.  HENT:  Head: Normocephalic and atraumatic.  Right Ear: Tympanic membrane and ear canal normal.  Left Ear: Tympanic membrane and ear canal normal.  Mouth/Throat: Oropharynx is clear and moist.  Eyes: Pupils are equal, round, and reactive to light. No scleral icterus.  Neck: Normal range of motion. No thyromegaly present.  Cardiovascular: Normal rate and regular rhythm.   No murmur heard. Pulmonary/Chest: Effort normal and breath sounds normal. No respiratory distress. He has no wheezes. She has no rales. She exhibits no tenderness.  Abdominal: Soft. Bowel sounds are normal. He exhibits no distension and no mass. There is no tenderness. There is no rebound and no guarding.  Musculoskeletal: She exhibits no edema.  Lymphadenopathy:    She has no cervical adenopathy.  Neurological: She is alert and oriented to person, place, and time.  She exhibits normal muscle tone. Coordination normal.  Skin: Skin is warm and dry.  Psychiatric: She has a normal mood and affect. Her behavior is normal. Judgment and thought content normal.  Breasts: Examined lying Right: Without masses, retractions, discharge or axillary adenopathy.  Left: Without masses, retractions, discharge or axillary adenopathy.  Inguinal/mons: Normal without inguinal adenopathy  External genitalia: Normal  BUS/Urethra/Skene's glands: Normal  Bladder: Normal  Vagina: Normal  Uterus/cervix- surgically absent.  Adnexa/parametria:  Rt: Without masses or tenderness.  Lt: Without masses or tenderness.  Anus and perineum: Normal            Assessment & Plan:     Assessment:   Medicare wellness utd on preventive parameters   Plan:    During the course of the visit the patient was educated and counseled about appropriate screening and preventive services including:       Fall prevention   Screening mammography  Bone densitometry screening  Nutrition counseling   Vaccines / LABS Tdap, pneumovax today- consider zostavax next visit. Fasting labs as below  Patient Instructions (the written plan) was given to the patient.

## 2013-06-07 ENCOUNTER — Telehealth: Payer: Self-pay | Admitting: *Deleted

## 2013-06-07 MED ORDER — OMEPRAZOLE 20 MG PO CPDR: 20.0000 mg | DELAYED_RELEASE_CAPSULE | ORAL | Status: DC

## 2013-06-07 MED ORDER — CLONIDINE HCL 0.2 MG PO TABS: 0.2000 mg | ORAL_TABLET | Freq: Two times a day (BID) | ORAL | Status: DC

## 2013-06-07 MED ORDER — VALSARTAN-HYDROCHLOROTHIAZIDE 160-25 MG PO TABS: 1.0000 | ORAL_TABLET | Freq: Every day | ORAL | Status: DC

## 2013-06-07 MED ORDER — PRAVASTATIN SODIUM 80 MG PO TABS: 80.0000 mg | ORAL_TABLET | Freq: Every day | ORAL | Status: DC

## 2013-06-07 NOTE — Telephone Encounter (Signed)
Received call from pt requesting refills of all medications except insulin as she still has 1 refill left on that Rx.  Refills sent and pt notified.

## 2013-06-08 ENCOUNTER — Telehealth: Payer: Self-pay | Admitting: Family

## 2013-06-08 NOTE — Telephone Encounter (Signed)
Ordered placed 12/2.

## 2013-06-08 NOTE — Telephone Encounter (Signed)
Need order for Bone Density, they are sch for tomorrow

## 2013-06-09 ENCOUNTER — Ambulatory Visit (INDEPENDENT_AMBULATORY_CARE_PROVIDER_SITE_OTHER)
Admission: RE | Admit: 2013-06-09 | Discharge: 2013-06-09 | Disposition: A | Discharge status: Home / Self Care | Payer: Medicare Other | Source: Ambulatory Visit | Attending: Family | Admitting: Family

## 2013-06-09 DIAGNOSIS — Z13820 Encounter for screening for osteoporosis: Secondary | ICD-10-CM

## 2013-06-09 NOTE — Addendum Note (Signed)
Addended by: Sandford Craze on: 06/09/2013 08:47 AM   Modules accepted: Orders

## 2013-06-12 ENCOUNTER — Ambulatory Visit (HOSPITAL_BASED_OUTPATIENT_CLINIC_OR_DEPARTMENT_OTHER)
Admission: RE | Admit: 2013-06-12 | Discharge: 2013-06-12 | Disposition: A | Discharge status: Home / Self Care | Payer: Medicare Other | Source: Ambulatory Visit | Attending: Family | Admitting: Family

## 2013-06-12 DIAGNOSIS — Z1231 Encounter for screening mammogram for malignant neoplasm of breast: Secondary | ICD-10-CM | POA: Insufficient documentation

## 2013-06-13 ENCOUNTER — Encounter: Payer: Self-pay | Admitting: Family

## 2013-06-13 ENCOUNTER — Other Ambulatory Visit: Payer: Self-pay | Admitting: Family

## 2013-06-13 ENCOUNTER — Telehealth: Payer: Self-pay | Admitting: Family

## 2013-06-13 DIAGNOSIS — R928 Other abnormal and inconclusive findings on diagnostic imaging of breast: Secondary | ICD-10-CM

## 2013-06-13 MED ORDER — INSULIN LISPRO 100 UNIT/ML ~~LOC~~ SOLN: SUBCUTANEOUS | Status: DC

## 2013-06-13 MED ORDER — INSULIN GLARGINE 100 UNIT/ML ~~LOC~~ SOLN: 72.0000 [IU] | Freq: Every day | SUBCUTANEOUS | Status: DC

## 2013-06-13 MED ORDER — ALENDRONATE SODIUM 70 MG PO TABS: 70.0000 mg | ORAL_TABLET | ORAL | Status: DC

## 2013-06-13 NOTE — Telephone Encounter (Signed)
Left message requesting that pt return my call to discuss lab work.

## 2013-06-13 NOTE — Telephone Encounter (Addendum)
Spoke to pt re: uncontrolled trigs and DM.  She is agreeable to switch to lantus HS along with humalog sliding scale.  Sliding scale provided to the patient.  Humalog sliding scale- check sugar and inject 3 times daily before meals as below:  <150-   Zero units 150-200 2 units 201-250 4 units 251-300 6 units 301-350 8 units 351-400 10 units >400             12 units and contact us.  Also discussed bone density testing which notes osteoporosis. She is agreeable to start fosamax.  (CrCl 37 based on calculation- needs to be > 35).   Pt instructed to keep sugar log, call in 1 week with readings.

## 2013-06-15 ENCOUNTER — Other Ambulatory Visit: Payer: Medicare Other

## 2013-06-19 ENCOUNTER — Telehealth: Payer: Self-pay | Admitting: *Deleted

## 2013-06-19 NOTE — Telephone Encounter (Signed)
Message copied by Kathi Simpers on Mon Jun 19, 2013  7:48 AM ------      Message from: O'SULLIVAN, MELISSA      Created: Tue Jun 06, 2013  9:34 AM       pls add colo 3 yrs ago- normal per pt ------

## 2013-06-21 ENCOUNTER — Ambulatory Visit
Admission: RE | Admit: 2013-06-21 | Discharge: 2013-06-21 | Disposition: A | Discharge status: Home / Self Care | Payer: Medicare Other | Source: Ambulatory Visit | Attending: Family | Admitting: Family

## 2013-06-21 ENCOUNTER — Other Ambulatory Visit: Payer: Self-pay | Admitting: Family

## 2013-06-21 DIAGNOSIS — R928 Other abnormal and inconclusive findings on diagnostic imaging of breast: Secondary | ICD-10-CM

## 2013-06-28 ENCOUNTER — Other Ambulatory Visit: Payer: Medicare Other

## 2013-07-11 ENCOUNTER — Telehealth: Payer: Self-pay | Admitting: Family

## 2013-07-11 NOTE — Telephone Encounter (Signed)
PATIENT JUST STARTED ON LANTUS.  SHE IS USING REGULAR NEEDLES TO INJECT THE LANTUS  HER RX SAYS 2 UNITS  THIS SEEMS TO BE VERY DIFFICULT TO TELL WITH THESE NEEDLES.

## 2013-07-11 NOTE — Telephone Encounter (Signed)
Spoke with pt.  She states she has been taking Lantus on a sliding scale.  I advised pt per 06/13/13 phone note that she should be taking Humalog on a sliding scale and lantus should be 72 units at bedtime.  Pt states she did not pick up the humalog because she thought that was a mistake.  Pt verifies that current bottle states Lantus and directions on bottle say 72 units at bedtime. Pt will begin lantus 72 units tonight. She will contact pharmacy and pick up humalog. She will check blood sugar three times daily before meals and begin sliding scale as needed.

## 2013-07-12 ENCOUNTER — Telehealth: Payer: Self-pay | Admitting: Family

## 2013-07-12 NOTE — Telephone Encounter (Signed)
Request to speak with nurse, unable to afford insulin.

## 2013-07-12 NOTE — Telephone Encounter (Signed)
Spoke with pt. Pt states she received 3 vials of lantus and each one is 100U/mL.  Advised pt each vial contains 27mL which should equal 1000 units per vial. 1 vial should last pt around 12 days on current dose of 72 units. Pt voices understanding and states she thinks she can afford that. Also states the lantus has warnings not to take with clonidine or aspirin and pharmacist told pt to contact us to make sure it is ok to proceed. Please advise?

## 2013-07-12 NOTE — Telephone Encounter (Signed)
OK to take with clonidine and aspirin.

## 2013-07-12 NOTE — Telephone Encounter (Signed)
Notified pt and she voices understanding. 

## 2013-07-17 ENCOUNTER — Encounter: Payer: Self-pay | Admitting: Family

## 2013-07-17 DIAGNOSIS — E11319 Type 2 diabetes mellitus with unspecified diabetic retinopathy without macular edema: Secondary | ICD-10-CM | POA: Insufficient documentation

## 2013-08-26 ENCOUNTER — Telehealth: Payer: Self-pay | Admitting: *Deleted

## 2013-08-26 NOTE — Telephone Encounter (Signed)
Health maintenance updated

## 2013-08-26 NOTE — Telephone Encounter (Signed)
Message copied by Ronny Flurry on Sat Aug 26, 2013 11:25 AM ------      Message from: O'SULLIVAN, MELISSA      Created: Mon Jul 17, 2013  7:16 AM       Please add Eye exam 07/04/13- Diabetic retinopathy thanks ------

## 2013-09-04 ENCOUNTER — Ambulatory Visit: Payer: Medicare Other | Admitting: Family

## 2013-09-06 ENCOUNTER — Ambulatory Visit (INDEPENDENT_AMBULATORY_CARE_PROVIDER_SITE_OTHER): Payer: Medicare Other | Admitting: Family

## 2013-09-06 ENCOUNTER — Telehealth: Payer: Self-pay | Admitting: Family

## 2013-09-06 ENCOUNTER — Encounter: Payer: Self-pay | Admitting: Family

## 2013-09-06 VITALS — BP 150/60 | HR 58 | Temp 97.9°F | Resp 16 | Ht 59.0 in | Wt 150.1 lb

## 2013-09-06 DIAGNOSIS — I10 Essential (primary) hypertension: Secondary | ICD-10-CM

## 2013-09-06 DIAGNOSIS — E119 Type 2 diabetes mellitus without complications: Secondary | ICD-10-CM

## 2013-09-06 DIAGNOSIS — E785 Hyperlipidemia, unspecified: Secondary | ICD-10-CM

## 2013-09-06 DIAGNOSIS — E1149 Type 2 diabetes mellitus with other diabetic neurological complication: Secondary | ICD-10-CM

## 2013-09-06 LAB — BASIC METABOLIC PANEL WITH GFR
BUN: 18 mg/dL (ref 6–23)
CHLORIDE: 99 meq/L (ref 96–112)
CO2: 23 mEq/L (ref 19–32)
Calcium: 9.4 mg/dL (ref 8.4–10.5)
Creat: 1.21 mg/dL — ABNORMAL HIGH (ref 0.50–1.10)
GFR, EST AFRICAN AMERICAN: 51 mL/min — AB
GFR, EST NON AFRICAN AMERICAN: 44 mL/min — AB
Glucose, Bld: 173 mg/dL — ABNORMAL HIGH (ref 70–99)
POTASSIUM: 4.3 meq/L (ref 3.5–5.3)
Sodium: 137 mEq/L (ref 135–145)

## 2013-09-06 LAB — LIPID PANEL
CHOL/HDL RATIO: 6.6 ratio
Cholesterol: 185 mg/dL (ref 0–200)
HDL: 28 mg/dL — ABNORMAL LOW (ref >39)
TRIGLYCERIDES: 748 mg/dL — AB (ref <150)

## 2013-09-06 LAB — HEMOGLOBIN A1C
HEMOGLOBIN A1C: 8.8 % — AB (ref <5.7)
Mean Plasma Glucose: 206 mg/dL — ABNORMAL HIGH (ref <117)

## 2013-09-06 NOTE — Patient Instructions (Signed)
Please complete lab work prior to leaving. Check blood pressure once daily for 1 week and then contact us with your readings.  Follow up in 3 months.

## 2013-09-06 NOTE — Progress Notes (Signed)
Pre visit review using our clinic review tool, if applicable. No additional management support is needed unless otherwise documented below in the visit note. 

## 2013-09-06 NOTE — Assessment & Plan Note (Signed)
Improving. Continue current lantus + humalog sliding scale. Obtain bmet, a1C.

## 2013-09-06 NOTE — Assessment & Plan Note (Signed)
SBP up today.  Advised pt to check BP daily at home x 1 week and contact us with your readings.  Continue diovan HCT.

## 2013-09-06 NOTE — Telephone Encounter (Signed)
Relevant patient education mailed to patient.  

## 2013-09-06 NOTE — Assessment & Plan Note (Addendum)
Continue statin, obtain lipid panel.

## 2013-09-06 NOTE — Progress Notes (Signed)
Subjective:    Patient ID: Michaela Shaw, female    DOB: [birth year], 73 y.o.   MRN: [id number]  HPI  Michaela Shaw is a 73 yr old female who presents today for follow up of multiple medical problems.   1) DM2- Currently maintained on Lantus 72 units and humalog. (generally needing 2-4 units prior to each meal) She has had 2 episodes of asymptomatic hypoglycemia with readings in mid 60's. Las a1C was 9.3 in December.  Humalog sliding scale was added last visit.  Reports that fasting sugars running 80-100.    2) HTN- Current blood pressure meds include valsartan-hctz, and clonidine.  BP Readings from Last 3 Encounters:  09/06/13 150/60  06/06/13 120/64  03/03/13 150/72    3) Hyperlipidemia-Lasl LDL was not calculated due to hypertriglyceridemia.  She is mainatined on pravastatin.     Review of Systems See HPI  Past Medical History  Diagnosis Date  . History of chicken pox   . Diabetes mellitus without complication   . Hyperlipidemia   . Hypertension   . Stroke 2011    "mild" per pt.   . Seizures 2005    History   Social History  . Marital Status: Legally Separated    Spouse Name: N/A    Number of Children: N/A  . Years of Education: N/A   Occupational History  . Not on file.   Social History Main Topics  . Smoking status: Never Smoker   . Smokeless tobacco: Never Used  . Alcohol Use: No  . Drug Use: Not on file  . Sexual Activity: Not on file   Other Topics Concern  . 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 Bladder suspension  1980  . Abdominal hysterectomy  1980    partial  . Cholecystectomy  2007  . Appendectomy  2007  . Spine surgery  2012    Has rods and pins    Family History  Problem Relation Age of Onset  . Heart  disease Mother   . Diabetes Mother   . Alzheimer's disease Father   . Cancer Sister     colon  . Diabetes Sister   . Diabetes Daughter   . Diabetes Sister     No Known Allergies  Current Outpatient Prescriptions on File Prior to Visit  Medication Sig Dispense Refill  . alendronate (FOSAMAX) 70 MG tablet Take 1 tablet (70 mg total) by mouth every 7 (seven) days. Take with a full glass of water on an empty stomach.  4 tablet  11  . aspirin 81 MG tablet Take 81 mg by mouth daily.      . Calcium Carbonate-Vitamin D (CALCIUM 600 + D PO) Take 1 tablet by mouth every morning.      . cloNIDine (CATAPRES) 0.2 MG tablet Take 1 tablet (0.2 mg total) by mouth 2 (two) times daily.  60 tablet  2  . co-enzyme Q-10 30 MG capsule Take 100 mg by mouth daily.      . insulin glargine (LANTUS) 100 UNIT/ML injection Inject 0.72 mLs (72 Units total) into the skin at bedtime.  30 mL  3  . insulin lispro (  HUMALOG) 100 UNIT/ML injection Inject 3 times daily per sliding scale.  10 mL  12  . Insulin Syringe-Needle U-100 (RELION INSULIN SYR 0.3CC/30G) 30G X 5/16" 0.3 ML MISC Inject 1 each into the skin 2 (two) times daily.      Marland Kitchen omeprazole (PRILOSEC) 20 MG capsule Take 1 capsule (20 mg total) by mouth every morning.  30 capsule  2  . potassium gluconate 595 MG TABS tablet Take 595 mg by mouth daily.      . pravastatin (PRAVACHOL) 80 MG tablet Take 1 tablet (80 mg total) by mouth daily.  30 tablet  2  . valsartan-hydrochlorothiazide (DIOVAN-HCT) 160-25 MG per tablet Take 1 tablet by mouth daily.  30 tablet  2   No current facility-administered medications on file prior to visit.    BP 150/60  Pulse 58  Temp(Src) 97.9 F (36.6 C) (Oral)  Resp 16  Ht 4\' 11"  (1.499 m)  Wt 150 lb 1.9 oz (68.094 kg)  BMI 30.30 kg/m2  SpO2 99%  LMP 07/06/1978       Objective:   Physical Exam  Constitutional: She is oriented to person, place, and time. She appears well-developed and well-nourished. No distress.  HENT:    Head: Normocephalic and atraumatic.  Cardiovascular: Normal rate and regular rhythm.   No murmur heard. Pulmonary/Chest: Effort normal and breath sounds normal. No respiratory distress. She has no wheezes. She has no rales. She exhibits no tenderness.  Musculoskeletal: She exhibits no edema.  Neurological: She is alert and oriented to person, place, and time.  Skin: Skin is warm and dry.  Psychiatric: She has a normal mood and affect. Her behavior is normal. Judgment and thought content normal.          Assessment & Plan:

## 2013-09-09 ENCOUNTER — Telehealth: Payer: Self-pay | Admitting: Family

## 2013-09-09 MED ORDER — FENOFIBRATE 145 MG PO TABS: 145.0000 mg | ORAL_TABLET | Freq: Every day | ORAL | Status: DC

## 2013-09-09 NOTE — Telephone Encounter (Signed)
Sugar is uncontrolled.  Increase lantus from 72 units to 77 units.  Continue humalog sliding scale coverage. Also, triglycerides are very elevated.  Please add fenofibrate once daily. Follow up in 3 months.

## 2013-09-12 NOTE — Telephone Encounter (Signed)
Left message on voicemail to have pt return my call.

## 2013-09-12 NOTE — Telephone Encounter (Signed)
Pt returned my call and left message to call her back.  Attempted to reach pt and received voicemail. DId not leave message, will try again tomorrow.

## 2013-09-13 NOTE — Telephone Encounter (Signed)
Left message on home# to return my call and let us know if we can leave her detailed message on her voicemail.

## 2013-09-18 ENCOUNTER — Telehealth: Payer: Self-pay | Admitting: Family

## 2013-09-18 NOTE — Telephone Encounter (Signed)
Patient was told to call in with her bp readings  3.09.15 135/59 3.10.15 160/62 3.11.15 151/74 3.12.15 136/72 3.13.15 125/72 3.14.15 130/51   Patient states that she checked her readings at different times of the day.

## 2013-09-18 NOTE — Telephone Encounter (Signed)
Continue current meds for now.  Continue to check blood pressure regularly.  Let me know if SBP is consistently > 140. It appears that she has had some high reading but also some very good readings.

## 2013-09-19 NOTE — Telephone Encounter (Signed)
Notified pt and she voices understanding. 

## 2013-09-19 NOTE — Telephone Encounter (Signed)
Left message on home # to return my call. 

## 2013-09-19 NOTE — Telephone Encounter (Signed)
Pt called back and was advised of below instructions and voices understanding.

## 2013-09-28 ENCOUNTER — Telehealth: Payer: Self-pay | Admitting: Family

## 2013-09-28 ENCOUNTER — Other Ambulatory Visit: Payer: Self-pay | Admitting: Family

## 2013-09-28 NOTE — Telephone Encounter (Signed)
Requesting refill on valsartan-hydrochlorothiazide

## 2013-09-29 MED ORDER — VALSARTAN-HYDROCHLOROTHIAZIDE 160-25 MG PO TABS: 1.0000 | ORAL_TABLET | Freq: Every day | ORAL | Status: DC

## 2013-10-20 ENCOUNTER — Other Ambulatory Visit: Payer: Self-pay | Admitting: Family

## 2013-10-20 NOTE — Telephone Encounter (Signed)
Rx request to pharmacy/SLS  

## 2013-11-15 ENCOUNTER — Telehealth: Payer: Self-pay | Admitting: *Deleted

## 2013-11-15 NOTE — Telephone Encounter (Signed)
Pt called back. States all hypoglycemic events are in the morning (fasting).  Has had 10 episodes within the last month. All readings < 70.  (46, 51, 53, 66).  Reports that she has no appetite and has not been eating complete meals.  Current lantus dose is 72 units at bedtime and humalog is on a sliding scale with meals.  Please advise.

## 2013-11-15 NOTE — Telephone Encounter (Signed)
Decrease lantus to 67 units, call if any further glucose readings <80. Keep upcoming apt on 12/11/13.

## 2013-11-15 NOTE — Telephone Encounter (Signed)
Received message from pt that she has been having increase of hypoglycemia episodes and the diabetic study nurse told her to contact us for med adjustments.  Attempted to reach pt and left message for pt to call me back.  Need to know what time of day hypoglycemic events are occurring and what have her readings been?

## 2013-11-15 NOTE — Telephone Encounter (Signed)
Notified pt and she voices understanding. 

## 2013-12-08 ENCOUNTER — Telehealth: Payer: Self-pay | Admitting: Family

## 2013-12-08 MED ORDER — OMEPRAZOLE 20 MG PO CPDR: 20.0000 mg | DELAYED_RELEASE_CAPSULE | ORAL | Status: DC

## 2013-12-08 NOTE — Telephone Encounter (Signed)
Refill sent.

## 2013-12-08 NOTE — Telephone Encounter (Signed)
Refill- omeprazole  [facility] [address] in [place]

## 2013-12-08 NOTE — Addendum Note (Signed)
Addended by: Kelle Darting A on: 12/08/2013 08:59 AM   Modules accepted: Orders

## 2013-12-11 ENCOUNTER — Other Ambulatory Visit: Payer: Self-pay | Admitting: Family

## 2013-12-11 ENCOUNTER — Ambulatory Visit (INDEPENDENT_AMBULATORY_CARE_PROVIDER_SITE_OTHER): Payer: Medicare Other | Admitting: Family

## 2013-12-11 ENCOUNTER — Encounter: Payer: Self-pay | Admitting: Family

## 2013-12-11 VITALS — BP 128/70 | HR 77 | Temp 98.1°F | Resp 16 | Ht 59.0 in | Wt 142.0 lb

## 2013-12-11 DIAGNOSIS — E1149 Type 2 diabetes mellitus with other diabetic neurological complication: Secondary | ICD-10-CM

## 2013-12-11 DIAGNOSIS — E785 Hyperlipidemia, unspecified: Secondary | ICD-10-CM

## 2013-12-11 DIAGNOSIS — E119 Type 2 diabetes mellitus without complications: Secondary | ICD-10-CM

## 2013-12-11 DIAGNOSIS — I10 Essential (primary) hypertension: Secondary | ICD-10-CM

## 2013-12-11 DIAGNOSIS — R6881 Early satiety: Secondary | ICD-10-CM

## 2013-12-11 DIAGNOSIS — R634 Abnormal weight loss: Secondary | ICD-10-CM

## 2013-12-11 LAB — LIPID PANEL
CHOL/HDL RATIO: 7 ratio
CHOLESTEROL: 274 mg/dL — AB (ref 0–200)
HDL: 39 mg/dL — AB (ref >39)
TRIGLYCERIDES: 496 mg/dL — AB (ref <150)

## 2013-12-11 LAB — BASIC METABOLIC PANEL WITH GFR
BUN: 25 mg/dL — AB (ref 6–23)
CO2: 25 meq/L (ref 19–32)
CREATININE: 1.61 mg/dL — AB (ref 0.50–1.10)
Calcium: 9.6 mg/dL (ref 8.4–10.5)
Chloride: 102 mEq/L (ref 96–112)
GFR, EST NON AFRICAN AMERICAN: 32 mL/min — AB
GFR, Est African American: 36 mL/min — ABNORMAL LOW
Glucose, Bld: 90 mg/dL (ref 70–99)
POTASSIUM: 4.6 meq/L (ref 3.5–5.3)
Sodium: 138 mEq/L (ref 135–145)

## 2013-12-11 LAB — HEPATIC FUNCTION PANEL
ALT: 25 U/L (ref 0–35)
AST: 24 U/L (ref 0–37)
Albumin: 4 g/dL (ref 3.5–5.2)
Alkaline Phosphatase: 88 U/L (ref 39–117)
BILIRUBIN INDIRECT: 0.2 mg/dL (ref 0.2–1.2)
Bilirubin, Direct: 0.1 mg/dL (ref 0.0–0.3)
TOTAL PROTEIN: 6.8 g/dL (ref 6.0–8.3)
Total Bilirubin: 0.3 mg/dL (ref 0.2–1.2)

## 2013-12-11 LAB — HEMOGLOBIN A1C
Hgb A1c MFr Bld: 8.5 % — ABNORMAL HIGH (ref <5.7)
Mean Plasma Glucose: 197 mg/dL — ABNORMAL HIGH (ref <117)

## 2013-12-11 NOTE — Progress Notes (Signed)
Pre visit review using our clinic review tool, if applicable. No additional management support is needed unless otherwise documented below in the visit note. 

## 2013-12-11 NOTE — Assessment & Plan Note (Signed)
Obtain flp. Suspect that her joint pain is due to OA. Unlikely secondary to tricor.

## 2013-12-11 NOTE — Patient Instructions (Signed)
We are referring to gastroenterology- they will call you to set up the appointment. Stop at lab for blood work. Have a small snack at bedtime. Make follow up appointment for 6 weeks.

## 2013-12-11 NOTE — Assessment & Plan Note (Signed)
Obtain A1C. Recommended 3 regular meals a day. Recommend HS snack to avoid AM hypoglycemia. Continue current dose of lantus and meal time coverage.

## 2013-12-11 NOTE — Assessment & Plan Note (Signed)
BP stable on current meds continue same.

## 2013-12-11 NOTE — Progress Notes (Signed)
Subjective:    Patient ID: Michaela Shaw, female    DOB: January 03, 1941, 73 y.o.   MRN: [id number]  HPI Michaela Shaw is a 73 year old female here for fu for  HTN and DM.  DM2- She continues lantus at bedtime. Due to poor appetite and meal skipping, she has not been checking her sugars throughout the day and not taking humalog regularly.  Denies symptomatic hypoglycemia, but does report some AM fasting sugars in the 60's and 70's.   Has had lack of appetite for a couple of months. She is experiencing early satiety. Wt Readings from Last 3 Encounters:  12/11/13 142 lb 0.6 oz (64.429 kg)  09/06/13 150 lb 1.9 oz (68.094 kg)  06/06/13 157 lb (71.215 kg)   HTN- she is maintained on clonidine and diovan-hct.   BP Readings from Last 3 Encounters:  12/11/13 128/70  09/06/13 150/60  06/06/13 120/64   Lab Results  Component Value Date   HGBA1C 8.8* 09/06/2013    Review of Systems Diet/ nutrition: not eating well due to lack of appetite. Exercise program: had been going to gym but stopped a few weeks due to joint pain in shoulders and hips.  FASTING HYPERGLYCEMIA OR Diabetes :  FBS range/average: 60s-140s Highest 2 hr post meal glucose: has not checked recently. Medication compliance: yes. Taking 72 units of lantus and sliding scale for humalog Hypoglycemia: no episodes this year Ophthamology care: a few months ago   HYPERLIPIDEMIA:  Chest pain, palpitations:  no     Dyspnea: yes,- walking to mailbox causes SOB Edema: slight swelling left ankle Claudication: no Lightheadedness,Syncope: no      Blurred vision /diplopia/lossof vision: Limb numbness/tingling/burning: occasionally in both feet Abd pain, bowel changes: no  Myalgias: muscle pain Memory loss: no                                                                                Past Medical History  Diagnosis Date  . History of chicken pox   . Diabetes mellitus without complication   . Hyperlipidemia   . Hypertension     . Stroke 2011    "mild" per pt.   . Seizures 2005    History   Social History  . Marital Status: Legally Separated    Spouse Name: N/A    Number of Children: N/A  . Years of Education: N/A   Occupational History  . Not on file.   Social History Main Topics  . Smoking status: Never Smoker   . Smokeless tobacco: Never Used  . Alcohol Use: No  . Drug Use: Not on file  . Sexual Activity: Not on file   Other Topics Concern  . 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 Bladder suspension  1980  . Abdominal hysterectomy  1980    partial  . Cholecystectomy  2007  . Appendectomy  2007  . Spine surgery  2012    Has rods and pins    Family History  Problem Relation Age of Onset  . Heart disease Mother   . Diabetes Mother   . Alzheimer's disease Father   . Cancer Sister     colon  . Diabetes Sister   . Diabetes Daughter   . Diabetes Sister     No Known Allergies  Current Outpatient Prescriptions on File Prior to Visit  Medication Sig Dispense Refill  . alendronate (FOSAMAX) 70 MG tablet Take 1 tablet (70 mg total) by mouth every 7 (seven) days. Take with a full glass of water on an empty stomach.  4 tablet  11  . aspirin 81 MG tablet Take 81 mg by mouth daily.      . Calcium Carbonate-Vitamin D (CALCIUM 600 + D PO) Take 1 tablet by mouth every morning.      . cloNIDine (CATAPRES) 0.2 MG tablet TAKE ONE TABLET BY MOUTH TWICE DAILY  60 tablet  2  . co-enzyme Q-10 30 MG capsule Take 100 mg by mouth daily.      . fenofibrate (TRICOR) 145 MG tablet Take 1 tablet (145 mg total) by mouth daily.  30 tablet  3  . insulin glargine (LANTUS) 100 UNIT/ML injection Inject 67 Units into the skin at bedtime.       . insulin lispro (HUMALOG) 100 UNIT/ML  injection Inject 3 times daily per sliding scale.  10 mL  12  . Insulin Syringe-Needle U-100 (RELION INSULIN SYR 0.3CC/30G) 30G X 5/16" 0.3 ML MISC Inject 1 each into the skin 2 (two) times daily.      Marland Kitchen omeprazole (PRILOSEC) 20 MG capsule Take 1 capsule (20 mg total) by mouth every morning.  30 capsule  5  . potassium gluconate 595 MG TABS tablet Take 595 mg by mouth daily.      . pravastatin (PRAVACHOL) 80 MG tablet TAKE ONE TABLET BY MOUTH ONCE DAILY  30 tablet  2  . valsartan-hydrochlorothiazide (DIOVAN-HCT) 160-25 MG per tablet Take 1 tablet by mouth daily.  30 tablet  2   No current facility-administered medications on file prior to visit.    BP 128/70  Pulse 77  Temp(Src) 98.1 F (36.7 C) (Oral)  Resp 16  Ht 4\' 11"  (1.499 m)  Wt 142 lb 0.6 oz (64.429 kg)  BMI 28.67 kg/m2  SpO2 97%  LMP 07/06/1978      Objective:   Physical Exam  Constitutional: She is oriented to person, place, and time. She appears well-developed and well-nourished. No distress.  HENT:  Head: Normocephalic and atraumatic.  Mouth/Throat: Oropharynx is clear and moist.  Eyes: Pupils are equal, round, and reactive to light. No scleral icterus.  Neck: Normal range of motion.  Cardiovascular: Normal rate, regular rhythm, normal heart sounds and intact distal pulses.   Pulmonary/Chest: Effort normal and breath sounds normal. No respiratory distress.  Musculoskeletal: She exhibits edema (1+ edema left ankle).  Lymphadenopathy:    She has no cervical adenopathy.  Neurological: She is alert and oriented to person, place, and time.  Diabetic foot exam normal-checked with monofilaments.  Skin: Skin is warm and dry. She is not diaphoretic.  Psychiatric: She has a normal mood and affect. Her behavior is normal. Judgment and thought content normal.          Assessment & Plan:  Patient seen by [facility] NP-student.  I have personally seen and examined  patient and agree with Michaela Shaw's assessment and  plan.

## 2013-12-12 ENCOUNTER — Encounter: Payer: Self-pay | Admitting: Family

## 2013-12-12 ENCOUNTER — Other Ambulatory Visit: Payer: Self-pay | Admitting: Family

## 2013-12-12 DIAGNOSIS — N289 Disorder of kidney and ureter, unspecified: Secondary | ICD-10-CM

## 2013-12-12 DIAGNOSIS — E785 Hyperlipidemia, unspecified: Secondary | ICD-10-CM

## 2013-12-12 LAB — TSH: TSH: 3.738 u[IU]/mL (ref 0.350–4.500)

## 2013-12-12 NOTE — Telephone Encounter (Signed)
Please contact pt and advise her that her kidney function has worsened slightly.  I would like for her to meet with nephrology and have pended referral. Triglycerides are down slightly but still above goal.  Total cholesterol has worsened. Stop pravastatin, start crestor. Sugar remains uncontrolled.  Add [place] 100mg  once daily. Follow up in 6 weeks for flp/lft.

## 2013-12-13 MED ORDER — ROSUVASTATIN CALCIUM 20 MG PO TABS: 20.0000 mg | ORAL_TABLET | Freq: Every day | ORAL | Status: AC

## 2013-12-13 MED ORDER — SITAGLIPTIN PHOSPHATE 100 MG PO TABS
100.0000 mg | ORAL_TABLET | Freq: Every day | ORAL | Status: DC
Start: ? — End: 2014-01-22

## 2013-12-13 NOTE — Telephone Encounter (Signed)
Notified pt and she voices understanding and is agreeable to proceed with referral. Lab orders entered.

## 2013-12-13 NOTE — Telephone Encounter (Signed)
Left message on home # to return my call. 

## 2013-12-19 ENCOUNTER — Telehealth: Payer: Self-pay | Admitting: Family

## 2013-12-19 NOTE — Telephone Encounter (Signed)
Patient is requesting call back to explain her TSH results from the letter she received

## 2013-12-21 ENCOUNTER — Telehealth: Payer: Self-pay | Admitting: Family

## 2013-12-21 NOTE — Telephone Encounter (Signed)
Received 11 pages from [facility], sent to [facility]. 12/21/13/ss.

## 2013-12-25 ENCOUNTER — Telehealth: Payer: Self-pay | Admitting: Family

## 2013-12-25 NOTE — Telephone Encounter (Signed)
Received medical records from [place]

## 2013-12-26 NOTE — Telephone Encounter (Signed)
Tried to contact pt at home #. There was no answer. Will try again later.

## 2014-01-02 ENCOUNTER — Telehealth: Payer: Self-pay | Admitting: *Deleted

## 2014-01-02 NOTE — Telephone Encounter (Signed)
Pt called back and TSH letter was explained.

## 2014-01-02 NOTE — Telephone Encounter (Signed)
Left message on home # to return my call. 

## 2014-01-02 NOTE — Telephone Encounter (Signed)
Received call from pt stating she was standing in a chair to reach item on a higher shelf and fell backwards out of the chair. Pt denies LOC, fell onto hips and back hit the wall. Pt states she is very sore but has been maintaining pain with ibuprofen and tylenol. Pt has history of back surgery (rods/pins) in 2012. Pt already had f/u on 01/16/14 and wanted to wait until then to discuss the fall. Advised pt that she should be seen earlier so pt scheduled appt for 01/08/14 at 8:15am and wants to combine her 6wk f/u from 01/16/14.  Please advise.

## 2014-01-02 NOTE — Telephone Encounter (Signed)
Left message for pt to return my call.

## 2014-01-03 NOTE — Telephone Encounter (Signed)
Noted. Will address on 7/6.

## 2014-01-08 ENCOUNTER — Other Ambulatory Visit (HOSPITAL_BASED_OUTPATIENT_CLINIC_OR_DEPARTMENT_OTHER): Payer: Medicare Other

## 2014-01-08 ENCOUNTER — Ambulatory Visit (INDEPENDENT_AMBULATORY_CARE_PROVIDER_SITE_OTHER): Payer: Medicare Other | Admitting: Family

## 2014-01-08 ENCOUNTER — Encounter: Payer: Self-pay | Admitting: Family

## 2014-01-08 ENCOUNTER — Ambulatory Visit (HOSPITAL_BASED_OUTPATIENT_CLINIC_OR_DEPARTMENT_OTHER)
Admission: RE | Admit: 2014-01-08 | Discharge: 2014-01-08 | Disposition: A | Discharge status: Home / Self Care | Payer: Medicare Other | Source: Ambulatory Visit | Attending: Family | Admitting: Family

## 2014-01-08 ENCOUNTER — Telehealth: Payer: Self-pay | Admitting: Family

## 2014-01-08 VITALS — BP 130/68 | HR 57 | Temp 97.8°F | Resp 16 | Ht 59.0 in | Wt 143.0 lb

## 2014-01-08 DIAGNOSIS — S22000A Wedge compression fracture of unspecified thoracic vertebra, initial encounter for closed fracture: Secondary | ICD-10-CM

## 2014-01-08 DIAGNOSIS — R0781 Pleurodynia: Secondary | ICD-10-CM | POA: Insufficient documentation

## 2014-01-08 DIAGNOSIS — R6881 Early satiety: Secondary | ICD-10-CM | POA: Insufficient documentation

## 2014-01-08 DIAGNOSIS — M546 Pain in thoracic spine: Secondary | ICD-10-CM

## 2014-01-08 DIAGNOSIS — R079 Chest pain, unspecified: Secondary | ICD-10-CM | POA: Insufficient documentation

## 2014-01-08 DIAGNOSIS — T148XXA Other injury of unspecified body region, initial encounter: Secondary | ICD-10-CM

## 2014-01-08 DIAGNOSIS — R071 Chest pain on breathing: Secondary | ICD-10-CM

## 2014-01-08 DIAGNOSIS — R918 Other nonspecific abnormal finding of lung field: Secondary | ICD-10-CM

## 2014-01-08 DIAGNOSIS — IMO0002 Reserved for concepts with insufficient information to code with codable children: Secondary | ICD-10-CM | POA: Insufficient documentation

## 2014-01-08 DIAGNOSIS — M549 Dorsalgia, unspecified: Secondary | ICD-10-CM | POA: Insufficient documentation

## 2014-01-08 NOTE — Telephone Encounter (Signed)
Notified pt and she voices understanding. 

## 2014-01-08 NOTE — Progress Notes (Signed)
Subjective:    Patient ID: Michaela Shaw, female    DOB: February 28, 1941, 73 y.o.   MRN: [id number]  HPI  Michaela Shaw is a 73 yr old female who presents today for follow up and to discuss a fall.   Fall- Patient reports that she lost her balance while standing on a chair and lifting a box onto a shelf.  She fell backward off of the chair on 12/25/13. Hit back against the wall, has history of previous spinal surgery and continues to have back pain since the fall. Hurts to take a deep breath.  She reports that since that time she has had pain in her mid/lower back.    Early Satiety- Last visit a referral was made to GI.  Symptoms unchanged.  She reports that she signed a medical release at her old GI's office.  Does not yet have appointment.  Wt Readings from Last 3 Encounters:  01/08/14 143 lb (64.864 kg)  12/11/13 142 lb 0.6 oz (64.429 kg)  09/06/13 150 lb 1.9 oz (68.094 kg)   Review of Systems Past Medical History  Diagnosis Date  . History of chicken pox   . Diabetes mellitus without complication   . Hyperlipidemia   . Hypertension   . Stroke 2011    "mild" per pt.   . Seizures 2005    History   Social History  . Marital Status: Legally Separated    Spouse Name: N/A    Number of Children: N/A  . Years of Education: N/A   Occupational History  . Not on file.   Social History Main Topics  . Smoking status: Never Smoker   . Smokeless tobacco: Never Used  . Alcohol Use: No  . Drug Use: Not on file  . Sexual Activity: Not on file   Other Topics Concern  . 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 Bladder suspension  1980  . Abdominal hysterectomy  1980    partial  . Cholecystectomy  2007  . Appendectomy  2007  . Spine surgery  2012    Has rods  and pins    Family History  Problem Relation Age of Onset  . Heart disease Mother   . Diabetes Mother   . Alzheimer's disease Father   . Cancer Sister     colon  . Diabetes Sister   . Diabetes Daughter   . Diabetes Sister     No Known Allergies  Current Outpatient Prescriptions on File Prior to Visit  Medication Sig Dispense Refill  . alendronate (FOSAMAX) 70 MG tablet Take 1 tablet (70 mg total) by mouth every 7 (seven) days. Take with a full glass of water on an empty stomach.  4 tablet  11  . aspirin 81 MG tablet Take 81 mg by mouth daily.      . Calcium Carbonate-Vitamin D (CALCIUM 600 + D PO) Take 1 tablet by mouth every morning.      . cloNIDine (CATAPRES) 0.2 MG tablet TAKE ONE TABLET BY MOUTH TWICE DAILY  60 tablet  2  . co-enzyme Q-10 30 MG capsule Take 100 mg by mouth daily.      Marland Kitchen  fenofibrate (TRICOR) 145 MG tablet Take 1 tablet (145 mg total) by mouth daily.  30 tablet  3  . insulin glargine (LANTUS) 100 UNIT/ML injection Inject 67 Units into the skin at bedtime.       . insulin lispro (HUMALOG) 100 UNIT/ML injection Inject 3 times daily per sliding scale.  10 mL  12  . Insulin Syringe-Needle U-100 (RELION INSULIN SYR 0.3CC/30G) 30G X 5/16" 0.3 ML MISC Inject 1 each into the skin 2 (two) times daily.      Marland Kitchen omeprazole (PRILOSEC) 20 MG capsule Take 1 capsule (20 mg total) by mouth every morning.  30 capsule  5  . potassium gluconate 595 MG TABS tablet Take 595 mg by mouth daily.      . rosuvastatin (CRESTOR) 20 MG tablet Take 1 tablet (20 mg total) by mouth daily.  90 tablet  3  . sitaGLIPtin (JANUVIA) 100 MG tablet Take 1 tablet (100 mg total) by mouth daily.  30 tablet  2  . valsartan-hydrochlorothiazide (DIOVAN-HCT) 160-25 MG per tablet Take 1 tablet by mouth daily.  30 tablet  2   No current facility-administered medications on file prior to visit.    BP 130/68  Pulse 57  Temp(Src) 97.8 F (36.6 C) (Oral)  Resp 16  Ht 4\' 11"  (1.499 m)  Wt 143 lb (64.864 kg)   BMI 28.87 kg/m2  SpO2 96%  LMP 07/06/1978       Objective:   Physical Exam  Constitutional: She is oriented to person, place, and time. She appears well-developed and well-nourished. No distress.  HENT:  Head: Normocephalic and atraumatic.  Cardiovascular: Normal rate and regular rhythm.   No murmur heard. Pulmonary/Chest: Effort normal and breath sounds normal. No respiratory distress. She has no wheezes. She has no rales. She exhibits no tenderness.  Musculoskeletal: She exhibits no edema.  Neurological: She is alert and oriented to person, place, and time.  Psychiatric: She has a normal mood and affect. Her behavior is normal. Judgment and thought content normal.          Assessment & Plan:

## 2014-01-08 NOTE — Telephone Encounter (Signed)
Attempted to reach patient.  Female picked up the phone and then hung up.  Called back, left message on voicemail requesting that pt return our call. When she calls back, please let her know that CXR shows ? Pulmonary contusion versus pneumonia, they are recommending CT chest to further evaluate. I would like her to complete this today or tomorrow. Also, x ray shows compression fracture at T11. This is likely cause for her back pain.  I would recommend MRI of the thoracic spine to further evaluate (pended below).

## 2014-01-08 NOTE — Patient Instructions (Addendum)
Please complete your x ray on the first floor.   The GI office has not received your old records yet.  Please contact your previous GI and request that records be forwarded to [facility].   Decrease your evening lantus dose to 63 units. Start fosamax for bone health. Follow up in [facility] September.

## 2014-01-08 NOTE — Assessment & Plan Note (Signed)
Unchanged. Suspect element of gastroparesis given hx of DM2. We contact GI office and they are still waiting on her old records before they can schedule her to be seen.

## 2014-01-08 NOTE — Assessment & Plan Note (Signed)
X ray today notes:  Increased density in the right mid and lower lung may reflect  atelectasis, pneumonia, or pulmonary contusion. Will refer her for CT chest for further evaluation.  Clinically doubt pneumonia.

## 2014-01-08 NOTE — Progress Notes (Signed)
Pre visit review using our clinic review tool, if applicable. No additional management support is needed unless otherwise documented below in the visit note. 

## 2014-01-08 NOTE — Assessment & Plan Note (Signed)
X ray of the thoracic spine notes compression fracture T11.  Will obtain MRI and then plan referral to neurosurgery.

## 2014-01-09 ENCOUNTER — Ambulatory Visit (HOSPITAL_BASED_OUTPATIENT_CLINIC_OR_DEPARTMENT_OTHER)
Admission: RE | Admit: 2014-01-09 | Discharge: 2014-01-09 | Disposition: A | Discharge status: Home / Self Care | Payer: Medicare Other | Source: Ambulatory Visit | Attending: Family | Admitting: Family

## 2014-01-09 ENCOUNTER — Telehealth: Payer: Self-pay | Admitting: *Deleted

## 2014-01-09 DIAGNOSIS — S22009A Unspecified fracture of unspecified thoracic vertebra, initial encounter for closed fracture: Secondary | ICD-10-CM | POA: Insufficient documentation

## 2014-01-09 DIAGNOSIS — R918 Other nonspecific abnormal finding of lung field: Secondary | ICD-10-CM

## 2014-01-09 DIAGNOSIS — W19XXXA Unspecified fall, initial encounter: Secondary | ICD-10-CM | POA: Insufficient documentation

## 2014-01-09 DIAGNOSIS — I2510 Atherosclerotic heart disease of native coronary artery without angina pectoris: Secondary | ICD-10-CM | POA: Insufficient documentation

## 2014-01-09 MED ORDER — MOXIFLOXACIN HCL 400 MG PO TABS: 400.0000 mg | ORAL_TABLET | Freq: Every day | ORAL | Status: DC

## 2014-01-09 NOTE — Telephone Encounter (Addendum)
CT shows some nodules right lung.  Most likely due to recent infection.  I would like to treat her with avelox for 7 days to cover for pneumonia.  Follow up in 2 weeks.  Will need a CT chest repeated in 6 months.  Also, note is made of calcification of coronary arteries.  Continue aspirin and crestor.

## 2014-01-09 NOTE — Telephone Encounter (Signed)
Left message for pt to return my call.

## 2014-01-09 NOTE — Telephone Encounter (Signed)
Received call from [facility] with CT results. They are now available in EPIC.  Please advise.

## 2014-01-10 ENCOUNTER — Telehealth: Payer: Self-pay | Admitting: Family

## 2014-01-10 DIAGNOSIS — I739 Peripheral vascular disease, unspecified: Secondary | ICD-10-CM

## 2014-01-10 NOTE — Telephone Encounter (Signed)
Notified pt and she voices understanding. F/u scheduled for 01/24/14 at 10:30am.

## 2014-01-10 NOTE — Telephone Encounter (Signed)
Pt returned my call and left message. Attempted to reach pt and left message for her to return my call.

## 2014-01-10 NOTE — Telephone Encounter (Signed)
Please contact pt and let her know that I have reviewed the lifeline screening results that she brought to [place].  These results indicated that she may have some narrowing of the arteries in her legs. I would like to have her complete a formal study for further evaluation. Pended below.

## 2014-01-12 NOTE — Telephone Encounter (Signed)
Notified pt and she is agreeable to proceed with testing.

## 2014-01-12 NOTE — Telephone Encounter (Signed)
Left message to return my call.  

## 2014-01-13 ENCOUNTER — Ambulatory Visit (HOSPITAL_BASED_OUTPATIENT_CLINIC_OR_DEPARTMENT_OTHER)
Admission: RE | Admit: 2014-01-13 | Discharge: 2014-01-13 | Disposition: A | Discharge status: Home / Self Care | Payer: Medicare Other | Source: Ambulatory Visit | Attending: Family | Admitting: Family

## 2014-01-13 DIAGNOSIS — S22009A Unspecified fracture of unspecified thoracic vertebra, initial encounter for closed fracture: Secondary | ICD-10-CM | POA: Insufficient documentation

## 2014-01-13 DIAGNOSIS — X58XXXA Exposure to other specified factors, initial encounter: Secondary | ICD-10-CM | POA: Insufficient documentation

## 2014-01-13 DIAGNOSIS — S22000A Wedge compression fracture of unspecified thoracic vertebra, initial encounter for closed fracture: Secondary | ICD-10-CM

## 2014-01-15 ENCOUNTER — Telehealth: Payer: Self-pay | Admitting: Family

## 2014-01-15 ENCOUNTER — Other Ambulatory Visit (HOSPITAL_COMMUNITY): Payer: Self-pay | Admitting: Cardiology

## 2014-01-15 DIAGNOSIS — I739 Peripheral vascular disease, unspecified: Secondary | ICD-10-CM

## 2014-01-15 DIAGNOSIS — IMO0002 Reserved for concepts with insufficient information to code with codable children: Secondary | ICD-10-CM

## 2014-01-15 NOTE — Telephone Encounter (Signed)
Please let pt know that I have reviewed MRI. Compression fracture is small.  I will refer her to Dr. Vertell Limber who performs Kyphoplasty.  That is a procedure to help support the vertebrae and can often help with pain.

## 2014-01-15 NOTE — Telephone Encounter (Signed)
Diabetic Bundle- pt is to come back on 01-24-14 to see [facility] and labs are already ordered for this day

## 2014-01-15 NOTE — Telephone Encounter (Signed)
Left detailed message on home# and to call if any questions. 

## 2014-01-16 ENCOUNTER — Ambulatory Visit (HOSPITAL_COMMUNITY): Payer: Medicare Other | Attending: Family | Admitting: *Deleted

## 2014-01-16 ENCOUNTER — Ambulatory Visit: Payer: Medicare Other | Admitting: Family

## 2014-01-16 DIAGNOSIS — I739 Peripheral vascular disease, unspecified: Secondary | ICD-10-CM | POA: Insufficient documentation

## 2014-01-16 DIAGNOSIS — R209 Unspecified disturbances of skin sensation: Secondary | ICD-10-CM | POA: Insufficient documentation

## 2014-01-16 DIAGNOSIS — I10 Essential (primary) hypertension: Secondary | ICD-10-CM | POA: Insufficient documentation

## 2014-01-16 DIAGNOSIS — E119 Type 2 diabetes mellitus without complications: Secondary | ICD-10-CM | POA: Insufficient documentation

## 2014-01-16 DIAGNOSIS — E785 Hyperlipidemia, unspecified: Secondary | ICD-10-CM | POA: Insufficient documentation

## 2014-01-16 NOTE — Progress Notes (Signed)
Lower Arterial Doppler and Lower Arterial Duplex complete.

## 2014-01-17 ENCOUNTER — Encounter: Payer: Self-pay | Admitting: Family

## 2014-01-18 ENCOUNTER — Telehealth: Payer: Self-pay | Admitting: Family

## 2014-01-18 DIAGNOSIS — I739 Peripheral vascular disease, unspecified: Secondary | ICD-10-CM

## 2014-01-18 NOTE — Telephone Encounter (Signed)
Please let pt know that her ABI test suggests some circulation issues.  I would like for her to meet with Vascular MD.

## 2014-01-19 NOTE — Telephone Encounter (Signed)
Notified pt and she voices understanding. Pt reports she has appt with them on 02/19/14 at 1pm.

## 2014-01-22 ENCOUNTER — Telehealth: Payer: Self-pay | Admitting: Family

## 2014-01-22 ENCOUNTER — Encounter: Payer: Self-pay | Admitting: Internal Medicine

## 2014-01-22 ENCOUNTER — Ambulatory Visit (INDEPENDENT_AMBULATORY_CARE_PROVIDER_SITE_OTHER): Payer: Medicare Other | Admitting: Internal Medicine

## 2014-01-22 VITALS — BP 158/64 | HR 69 | Temp 97.9°F | Resp 16 | Ht 59.0 in | Wt 144.0 lb

## 2014-01-22 DIAGNOSIS — E1149 Type 2 diabetes mellitus with other diabetic neurological complication: Secondary | ICD-10-CM

## 2014-01-22 MED ORDER — INSULIN GLARGINE 100 UNIT/ML ~~LOC~~ SOLN: 40.0000 [IU] | Freq: Every day | SUBCUTANEOUS | Status: DC

## 2014-01-22 MED ORDER — LINAGLIPTIN 5 MG PO TABS: 5.0000 mg | ORAL_TABLET | Freq: Every day | ORAL | Status: DC

## 2014-01-22 NOTE — Telephone Encounter (Signed)
Patient left message stating she just left her appointment with Dr. Cruzita Lederer with [facility] and she does not think the patient should be taking prevastatin with the new med Melissa started her on, would like a call back advising

## 2014-01-22 NOTE — Patient Instructions (Signed)
Please decrease Lantus to 40 units at bedtime. Stop Januvia and start Tradjenta 5 mg in am. Please call me in 2 weeks with your sugars.   Please return in 1 month with your sugar log.   PATIENT INSTRUCTIONS FOR TYPE 2 DIABETES:  **Please join MyChart!** - see attached instructions about how to join if you have not done so already.  DIET AND EXERCISE Diet and exercise is an important part of diabetic treatment.  We recommended aerobic exercise in the form of brisk walking (working between 40-60% of maximal aerobic capacity, similar to brisk walking) for 150 minutes per week (such as 30 minutes five days per week) along with 3 times per week performing 'resistance' training (using various gauge rubber tubes with handles) 5-10 exercises involving the major muscle groups (upper body, lower body and core) performing 10-15 repetitions (or near fatigue) each exercise. Start at half the above goal but build slowly to reach the above goals. If limited by weight, joint pain, or disability, we recommend daily walking in a swimming pool with water up to waist to reduce pressure from joints while allow for adequate exercise.    BLOOD GLUCOSES Monitoring your blood glucoses is important for continued management of your diabetes. Please check your blood glucoses 2-4 times a day: fasting, before meals and at bedtime (you can rotate these measurements - e.g. one day check before the 3 meals, the next day check before 2 of the meals and before bedtime, etc.).   HYPOGLYCEMIA (low blood sugar) Hypoglycemia is usually a reaction to not eating, exercising, or taking too much insulin/ other diabetes drugs.  Symptoms include tremors, sweating, hunger, confusion, headache, etc. Treat IMMEDIATELY with 15 grams of Carbs:   4 glucose tablets    cup regular juice/soda   2 tablespoons raisins   4 teaspoons sugar   1 tablespoon honey Recheck blood glucose in 15 mins and repeat above if still symptomatic/blood glucose  <100.  RECOMMENDATIONS TO REDUCE YOUR RISK OF DIABETIC COMPLICATIONS: * Take your prescribed MEDICATION(S) * Follow a DIABETIC diet: Complex carbs, fiber rich foods, (monounsaturated and polyunsaturated) fats * AVOID saturated/trans fats, high fat foods, >2,300 mg salt per day. * EXERCISE at least 5 times a week for 30 minutes or preferably daily.  * DO NOT SMOKE OR DRINK more than 1 drink a day. * Check your FEET every day. Do not wear tightfitting shoes. Contact us if you develop an ulcer * See your EYE doctor once a year or more if needed * Get a FLU shot once a year * Get a PNEUMONIA vaccine once before and once after age 37 years  GOALS:  * Your Hemoglobin A1c of <7%  * fasting sugars need to be <130 * after meals sugars need to be <180 (2h after you start eating) * Your Systolic BP should be 161 or lower  * Your Diastolic BP should be 80 or lower  * Your HDL (Good Cholesterol) should be 40 or higher  * Your LDL (Bad Cholesterol) should be 100 or lower. * Your Triglycerides should be 150 or lower  * Your Urine microalbumin (kidney function) should be <30 * Your Body Mass Index should be 25 or lower   We will be glad to help you achieve these goals. Our telephone number is: [phone].

## 2014-01-22 NOTE — Progress Notes (Signed)
Patient ID: Michaela Shaw, female   DOB: 03-Sep-1940, 73 y.o.   MRN: [id number]  HPI: Michaela Shaw is a 73 y.o.-year-old female, referred by her PCP, Debbrah Alar S., NP , for management of DM2, non-insulin-dependent, uncontrolled, with complications (cerebrovascular disease - h/o stroke in 2009, CKD, DR, PN).  Patient has been diagnosed with diabetes in 2005; she started insulin in 2007.  Last hemoglobin A1c was: Lab Results  Component Value Date   HGBA1C 8.5* 12/11/2013   HGBA1C 8.8* 09/06/2013   HGBA1C 9.3* 06/06/2013   Pt is on a regimen of: - Januvia 100 mg in am  - Lantus 72 >> 63 units qhs  - Humalog SSI tid ac: target 150, ISF 25 (2 units for every 50)  Pt checks her sugars 1-3x a day and they are - only checks in am - many severe lows: - am: 27, 35, 39 - 257 - 2h after b'fast: n/c - before lunch: 126 - 2h after lunch: n/c - before dinner: n/c - 2h after dinner: n/c - bedtime: n/c Has lows. Lowest sugar was 27!!!; No hypoglycemia awareness! Highest sugar was 300s.  Pt's meals are: - Breakfast:1/2 bagel - Lunch: 1/2 bagel - Dinner: chicken + veggies - Snacks: 1  - Has CKD, last BUN/creatinine:  Lab Results  Component Value Date   BUN 25* 12/11/2013   CREATININE 1.61* 12/11/2013   - last set of lipids: Lab Results  Component Value Date   CHOL 274* 12/11/2013   HDL 39* 12/11/2013   LDLCALC NOT CALC 12/11/2013   TRIG 496* 12/11/2013   CHOLHDL 7.0 12/11/2013  She is on Crestor. - She has + DR.  - + numbness and tingling in her feet.  Pt has FH of DM in many family mbs - see below.  ROS: Constitutional: + weight loss, +  fatigue, no subjective hyperthermia/hypothermia, + poor sleep, + nocturia Eyes: no blurry vision, no xerophthalmia ENT: no sore throat, no nodules palpated in throat, no dysphagia/odynophagia, no hoarseness Cardiovascular: no CP/SOB/palpitations/+ leg swelling Respiratory: no cough/+ SOB Gastrointestinal: no N/V/D/+ C Musculoskeletal: no muscle/+ joint  aches Skin: no rashes Neurological: no tremors/numbness/tingling/dizziness Psychiatric: no depression/anxiety  Past Medical History  Diagnosis Date  . History of chicken pox   . Diabetes mellitus without complication   . Hyperlipidemia   . Hypertension   . Stroke 2011    "mild" per pt.   . Seizures 2005   Past Surgical History  Procedure Laterality Date  . Bladder suspension  1980  . Abdominal hysterectomy  1980    partial  . Cholecystectomy  2007  . Appendectomy  2007  . Spine surgery  2012    Has rods and pins   History   Social History Main Topics  . Smoking status: Never Smoker   . Smokeless tobacco: Never Used  . Alcohol Use: No  . Drug Use: Not on file   Social History Narrative   Son living with her, lives with daughter   RetiredPhotographer, factory work- Psychologist, counselling   Enjoys crafts shows   Completed 11 th grade   3 grand children   Current Outpatient Prescriptions on File Prior to Visit  Medication Sig Dispense Refill  . alendronate (FOSAMAX) 70 MG tablet Take 1 tablet (70 mg total) by mouth every 7 (seven) days. Take with a full glass of water on an empty stomach.  4 tablet  11  . aspirin 81 MG tablet Take 81 mg by mouth daily.      Marland Kitchen  Calcium Carbonate-Vitamin D (CALCIUM 600 + D PO) Take 1 tablet by mouth every morning.      . cloNIDine (CATAPRES) 0.2 MG tablet TAKE ONE TABLET BY MOUTH TWICE DAILY  60 tablet  2  . co-enzyme Q-10 30 MG capsule Take 100 mg by mouth daily.      . fenofibrate (TRICOR) 145 MG tablet Take 1 tablet (145 mg total) by mouth daily.  30 tablet  3  . insulin glargine (LANTUS) 100 UNIT/ML injection Inject 63 Units into the skin at bedtime.       . insulin lispro (HUMALOG) 100 UNIT/ML injection Inject 3 times daily per sliding scale.  10 mL  12  . Insulin Syringe-Needle U-100 (RELION INSULIN SYR 0.3CC/30G) 30G X 5/16" 0.3 ML MISC Inject 1 each into the skin 2 (two) times daily.      Marland Kitchen moxifloxacin (AVELOX) 400 MG tablet Take 1 tablet (400  mg total) by mouth daily.  7 tablet  0  . omeprazole (PRILOSEC) 20 MG capsule Take 1 capsule (20 mg total) by mouth every morning.  30 capsule  5  . potassium gluconate 595 MG TABS tablet Take 595 mg by mouth daily.      . rosuvastatin (CRESTOR) 20 MG tablet Take 1 tablet (20 mg total) by mouth daily.  90 tablet  3  . sitaGLIPtin (JANUVIA) 100 MG tablet Take 1 tablet (100 mg total) by mouth daily.  30 tablet  2  . valsartan-hydrochlorothiazide (DIOVAN-HCT) 160-25 MG per tablet Take 1 tablet by mouth daily.  30 tablet  2   No current facility-administered medications on file prior to visit.   No Known Allergies Family History  Problem Relation Age of Onset  . Heart disease Mother   . Diabetes Mother   . Alzheimer's disease Father   . Cancer Sister     colon  . Diabetes Sister   . Diabetes Daughter   . Diabetes Sister    PE: BP 158/64  Pulse 69  Temp(Src) 97.9 F (36.6 C) (Oral)  Resp 16  Ht 4\' 11"  (1.499 m)  Wt 144 lb (65.318 kg)  BMI 29.07 kg/m2  SpO2 97%  LMP 07/06/1978 Wt Readings from Last 3 Encounters:  01/22/14 144 lb (65.318 kg)  01/08/14 143 lb (64.864 kg)  12/11/13 142 lb 0.6 oz (64.429 kg)   Constitutional: overweight, in NAD Eyes: PERRLA, EOMI, no exophthalmos ENT: moist mucous membranes, no thyromegaly, no cervical lymphadenopathy Cardiovascular: RRR, No MRG Respiratory: CTA B Gastrointestinal: abdomen soft, NT, ND, BS+ Musculoskeletal: no deformities, strength intact in all 4 Skin: moist, warm, no rashes Neurological: no tremor with outstretched hands, DTR normal in all 4  ASSESSMENT: 1. DM2, insulin-dependent, uncontrolled, with complications - cerebrovascular disease - h/o stroke in 2009 - CKD - DR - PN  PLAN:  1. Patient with long-standing, uncontrolled diabetes, on oral antidiabetic regimen + a large dose of basal insulin, with severe hypoglycemia events. - We discussed about options for treatment, and I suggested to try to first eliminate the  low CBGs and then work on the high CBGs. We will reduce the Lantus dose and also switch from Januvia to [place] as the dose for this does not need to be adjusted ~ GFR:  Patient Instructions  Please decrease Lantus to 40 units at bedtime. Stop Januvia and start Tradjenta 5 mg in am. Please call me in 2 weeks with your sugars.   Please return in 1 month with your sugar log.   - Strongly  advised her to start checking sugars at different times of the day - check 3 times a day, rotating checks - given sugar log and advised how to fill it and to bring it at next appt  - given foot care handout and explained the principles  - given instructions for hypoglycemia management "15-15 rule"  - advised for yearly eye exams - Return to clinic in 1 mo with sugar log

## 2014-01-23 NOTE — Telephone Encounter (Signed)
Left message for pt to return my call.

## 2014-01-23 NOTE — Telephone Encounter (Signed)
Please advise 

## 2014-01-23 NOTE — Telephone Encounter (Signed)
Please advise pt- D/C pravastatin- she should be on crestor for her cholesterol.

## 2014-01-23 NOTE — Telephone Encounter (Signed)
Dr. Cruzita Lederer, please see note below.  Would you like pt to discontinue statin? Thanks

## 2014-01-23 NOTE — Telephone Encounter (Signed)
No, Melissa, sorry for the confusion. She told me she was on Pravastatin but you recently started her on Crestor. As of now she is taking both statins and I asked her to give you a call and clarify if she needs to only take Crestor.

## 2014-01-24 ENCOUNTER — Other Ambulatory Visit: Payer: Self-pay | Admitting: Family

## 2014-01-24 ENCOUNTER — Encounter: Payer: Self-pay | Admitting: Family

## 2014-01-24 ENCOUNTER — Ambulatory Visit (INDEPENDENT_AMBULATORY_CARE_PROVIDER_SITE_OTHER): Payer: Medicare Other | Admitting: Family

## 2014-01-24 VITALS — BP 138/70 | HR 63 | Temp 98.1°F | Resp 16 | Ht 59.0 in | Wt 144.1 lb

## 2014-01-24 DIAGNOSIS — R918 Other nonspecific abnormal finding of lung field: Secondary | ICD-10-CM

## 2014-01-24 DIAGNOSIS — IMO0002 Reserved for concepts with insufficient information to code with codable children: Secondary | ICD-10-CM

## 2014-01-24 DIAGNOSIS — R6881 Early satiety: Secondary | ICD-10-CM

## 2014-01-24 MED ORDER — INSULIN GLARGINE 100 UNIT/ML ~~LOC~~ SOLN: 40.0000 [IU] | Freq: Every day | SUBCUTANEOUS | Status: DC

## 2014-01-24 NOTE — Telephone Encounter (Signed)
Notified pt. 

## 2014-01-24 NOTE — Patient Instructions (Addendum)
Please attend neurosurgeon appointment. Please remember to repeat ct of chest in 6 months. Keep adequate calorie intake to keep weight up. If despite adequate calorie intake and if  loosing weight notify us. Your lantus refill was sent to your pharmacy.

## 2014-01-24 NOTE — Telephone Encounter (Signed)
Left message to return my call and let us know if we can leave detailed message on her voicemail.

## 2014-01-24 NOTE — Progress Notes (Signed)
Subjective:    Patient ID: Michaela Shaw, female    DOB: April 10, 1941, 73 y.o.   MRN: [id number]  HPI  Michaela Shaw is a 73 yr old female who presents today for follow up.  Pt has compression fx. Suffered from fall and she does have appointment with Dr. Peyson Ohara, neurosurgeon. Pt taking advil. 3 in am and 3 in pm. Pt does not want to be on pain medication.  Lung abnormality found on initial imaging of spine. So did ct chest which noted nodules and placed on antibiotics. Pt aware of need to repeat ct chest in 6 months.  She reports that she has had some hypoglycemia. As a result she has cut back on lantus to 40 units at night per endo instructions. Stopped [place] and given tradjenta.  Pt has not seen GI yet. Last colonscopy was 2 yrs ago. Dr. Lyndel Safe in [place] thought she should repeat due to fh. But her results were normal.   Review of Systems See HPI  Past Medical History  Diagnosis Date  . History of chicken pox   . Diabetes mellitus without complication   . Hyperlipidemia   . Hypertension   . Stroke 2011    "mild" per pt.   . Seizures 2005    History   Social History  . Marital Status: Legally Separated    Spouse Name: N/A    Number of Children: N/A  . Years of Education: N/A   Occupational History  . Not on file.   Social History Main Topics  . Smoking status: Never Smoker   . Smokeless tobacco: Never Used  . Alcohol Use: No  . Drug Use: Not on file  . Sexual Activity: Not on file   Other Topics Concern  . 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 Bladder suspension  1980  . Abdominal hysterectomy  1980    partial  . Cholecystectomy  2007  . Appendectomy  2007  . Spine surgery  2012    Has rods and pins    Family History    Problem Relation Age of Onset  . Heart disease Mother   . Diabetes Mother   . Alzheimer's disease Father   . Cancer Sister     colon  . Diabetes Sister   . Diabetes Daughter   . Diabetes Sister     No Known Allergies  Current Outpatient Prescriptions on File Prior to Visit  Medication Sig Dispense Refill  . alendronate (FOSAMAX) 70 MG tablet Take 1 tablet (70 mg total) by mouth every 7 (seven) days. Take with a full glass of water on an empty stomach.  4 tablet  11  . aspirin 81 MG tablet Take 81 mg by mouth daily.      . Calcium Carbonate-Vitamin D (CALCIUM 600 + D PO) Take 1 tablet by mouth every morning.      . cloNIDine (CATAPRES) 0.2 MG tablet TAKE ONE TABLET BY MOUTH TWICE DAILY  60 tablet  2  . co-enzyme Q-10 30 MG capsule Take 100 mg by mouth daily.      . fenofibrate (TRICOR) 145 MG tablet Take 1 tablet (145 mg  total) by mouth daily.  30 tablet  3  . insulin lispro (HUMALOG) 100 UNIT/ML injection Inject 3 times daily per sliding scale.  10 mL  12  . Insulin Syringe-Needle U-100 (RELION INSULIN SYR 0.3CC/30G) 30G X 5/16" 0.3 ML MISC Inject 1 each into the skin 2 (two) times daily.      Marland Kitchen linagliptin (TRADJENTA) 5 MG TABS tablet Take 1 tablet (5 mg total) by mouth daily.  30 tablet  3  . moxifloxacin (AVELOX) 400 MG tablet Take 1 tablet (400 mg total) by mouth daily.  7 tablet  0  . omeprazole (PRILOSEC) 20 MG capsule Take 1 capsule (20 mg total) by mouth every morning.  30 capsule  5  . potassium gluconate 595 MG TABS tablet Take 595 mg by mouth daily.      . rosuvastatin (CRESTOR) 20 MG tablet Take 1 tablet (20 mg total) by mouth daily.  90 tablet  3  . valsartan-hydrochlorothiazide (DIOVAN-HCT) 160-25 MG per tablet Take 1 tablet by mouth daily.  30 tablet  2   No current facility-administered medications on file prior to visit.    BP 138/70  Pulse 63  Temp(Src) 98.1 F (36.7 C) (Oral)  Resp 16  Ht 4\' 11"  (1.499 m)  Wt 144 lb 1.3 oz (65.354 kg)  BMI 29.08 kg/m2   SpO2 99%  LMP 07/06/1978       Objective:   Physical Exam  Constitutional: She is oriented to person, place, and time. She appears well-developed and well-nourished. No distress.  Cardiovascular: Normal rate and regular rhythm.   No murmur heard. Pulmonary/Chest: Effort normal and breath sounds normal. No respiratory distress. She has no wheezes. She has no rales.  Neurological: She is alert and oriented to person, place, and time.  Skin: Skin is warm and dry.  Psychiatric: She has a normal mood and affect. Her behavior is normal. Judgment and thought content normal.          Assessment & Plan:  Patient seen along with Evern Core PA-C who is currently in orientation for training purposes. I have personally examined pt and agree with assessment and plan.

## 2014-01-25 ENCOUNTER — Telehealth: Payer: Self-pay

## 2014-01-25 ENCOUNTER — Other Ambulatory Visit: Payer: Self-pay | Admitting: Medical

## 2014-01-25 MED ORDER — INSULIN GLARGINE 100 UNIT/ML ~~LOC~~ SOLN: 40.0000 [IU] | Freq: Every day | SUBCUTANEOUS | Status: DC

## 2014-01-25 NOTE — Assessment & Plan Note (Signed)
Check status of GI referral.

## 2014-01-25 NOTE — Telephone Encounter (Signed)
Pt's Pharm has been contacted and given a verbal Auth has been given to refill her Lantus Insulin.

## 2014-01-25 NOTE — Assessment & Plan Note (Signed)
Will need follow up CT 1/16.

## 2014-01-25 NOTE — Assessment & Plan Note (Signed)
Pt to follow up with Dr. Zabella Ohara for ongoing management. Advised pt to stop using ibuprofen and try to switch to tylenol for pain.

## 2014-01-31 ENCOUNTER — Encounter: Payer: Self-pay | Admitting: Internal Medicine

## 2014-02-02 ENCOUNTER — Telehealth: Payer: Self-pay | Admitting: Internal Medicine

## 2014-02-02 NOTE — Telephone Encounter (Signed)
Patient called to let Dr Cruzita Lederer know how her blood sugar reading was going,  Mornings lows 47,  During the day high's 444,  Evenings high's 453.  Please advise

## 2014-02-02 NOTE — Telephone Encounter (Signed)
Please read note below and advise.  

## 2014-02-05 ENCOUNTER — Other Ambulatory Visit: Payer: Self-pay | Admitting: Family

## 2014-02-05 ENCOUNTER — Other Ambulatory Visit: Payer: Self-pay | Admitting: *Deleted

## 2014-02-05 MED ORDER — GLIPIZIDE 5 MG PO TABS: 5.0000 mg | ORAL_TABLET | Freq: Two times a day (BID) | ORAL | Status: DC

## 2014-02-05 NOTE — Telephone Encounter (Signed)
Please ask her to decrease Lantus to 50 units at bedtime. Start Glipizide 5 mg before b'fast and 5 mg before dinner. Let's send 40 tablets with 1 refill >> please ask her to call me with sugars in 1 week. Continue Tradjenta 5 mg in am.

## 2014-02-05 NOTE — Telephone Encounter (Signed)
Called pt and lvm advising her per Dr Arman Filter note. Sent rx for glipizide 5 mg 2 times daily, with 1 refill. Advised pt if she had any questions to call our office.

## 2014-02-06 ENCOUNTER — Telehealth: Payer: Self-pay | Admitting: Internal Medicine

## 2014-02-06 NOTE — Telephone Encounter (Signed)
Called pt and advised her per Dr Arman Filter result note. Pt understood.

## 2014-02-06 NOTE — Telephone Encounter (Signed)
Pt needs call back regarding lantus dosage change to decrease to 40 u at bedtime she is already at that dosage at night

## 2014-02-06 NOTE — Telephone Encounter (Signed)
Yes, I reviewed the chart again >> she is on 40 units >> decrease to 35 units. If still lows in am >> decrease to 30 units. Sorry for the confusion!

## 2014-02-06 NOTE — Telephone Encounter (Signed)
Please read note below and advise.  

## 2014-02-16 ENCOUNTER — Telehealth: Payer: Self-pay | Admitting: Internal Medicine

## 2014-02-16 ENCOUNTER — Encounter: Payer: Self-pay | Admitting: Surgery

## 2014-02-16 NOTE — Telephone Encounter (Signed)
Please call patient back for her to relay her readings

## 2014-02-16 NOTE — Telephone Encounter (Signed)
Called pt and lvm advising her per Dr Arman Filter note. Advised her if she started having lows again to let us know.

## 2014-02-16 NOTE — Telephone Encounter (Signed)
Returned pt's call. Pt stated that she had 4 lows this past week. She had a 64, 39, 56 and a 67. She decided to lower her Lantus on Sunday to 30 units. She said she has not had any lows all week. Also, during the day. Her readings have been 180, 170 and 133. Please advise if there need to be any other medication changes. Thank you.

## 2014-02-16 NOTE — Telephone Encounter (Signed)
If she has lows again >> may need to decrease the Glipizide to 1/2 tab 2x a day.

## 2014-02-19 ENCOUNTER — Encounter: Payer: Self-pay | Admitting: Surgery

## 2014-02-19 ENCOUNTER — Ambulatory Visit (INDEPENDENT_AMBULATORY_CARE_PROVIDER_SITE_OTHER): Payer: Medicare Other | Admitting: Surgery

## 2014-02-19 VITALS — BP 131/62 | HR 79 | Resp 16 | Ht 59.0 in | Wt 139.0 lb

## 2014-02-19 DIAGNOSIS — M79609 Pain in unspecified limb: Secondary | ICD-10-CM

## 2014-02-19 DIAGNOSIS — M79604 Pain in right leg: Secondary | ICD-10-CM

## 2014-02-19 DIAGNOSIS — M79605 Pain in left leg: Principal | ICD-10-CM

## 2014-02-19 DIAGNOSIS — I739 Peripheral vascular disease, unspecified: Secondary | ICD-10-CM | POA: Insufficient documentation

## 2014-02-19 DIAGNOSIS — I6529 Occlusion and stenosis of unspecified carotid artery: Secondary | ICD-10-CM

## 2014-02-19 NOTE — Progress Notes (Signed)
Patient name: Michaela Shaw MRN: [id number] DOB: 10-Jan-1941 Sex: female   Referred by: Dr. Inda Castle  Reason for referral:  Chief Complaint  Patient presents with  . New Evaluation    bilateral leg pain and swelling  referred by Lemar Livings NP    HISTORY OF PRESENT ILLNESS: This is a 73 year old female who comes in today for further evaluation of peripheral vascular disease.  The patient reports having had a stroke in 2011.  She describes having year pain as well as some balance issues.  She tells me that the etiology of her stroke is unknown.  She has no residual symptoms.  The patient suffers from diabetes.  She has undergone multiple medication changes recently.  Her most recent hemoglobin A1c was 8.7.  She suffers from hypercholesterolemia which is managed with a statin.  She is on multiple medications for blood pressure.  She has a remote history of smoking.  The patient recently underwent a lifeline screening evaluation.  This revealed mild carotid artery disease.  She also had an abnormal ankle-brachial index, less than 0.9.  She had a normal abdominal aorta.  She was sent to [facility] for further ultrasound evaluation which showed severe peripheral vascular disease and bilateral lower extremities.  The patient reports that she can't walk a fair distance without getting leg cramps.  She does complain of some edema, particularly in the left leg.  This was recently helped by wearing diabetic sides.  She denies rest pain although she does get cramps at night.  Past Medical History  Diagnosis Date  . History of chicken pox   . Diabetes mellitus without complication   . Hyperlipidemia   . Hypertension   . Stroke 2011    "mild" per pt.   . Seizures 2005    Past Surgical History  Procedure Laterality Date  . Bladder suspension  1980  . Abdominal hysterectomy  1980    partial  . Cholecystectomy  2007  . Appendectomy  2007  . Spine surgery  2012    Has rods and pins     History   Social History  . Marital Status: Legally Separated    Spouse Name: N/A    Number of Children: N/A  . Years of Education: N/A   Occupational History  . Not on file.   Social History Main Topics  . Smoking status: Never Smoker   . Smokeless tobacco: Never Used  . Alcohol Use: No  . Drug Use: No  . Sexual Activity: Not on file   Other Topics Concern  . 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 Heart disease Mother   . Diabetes Mother   . Alzheimer's disease Father   . Cancer Sister     colon  . Diabetes Sister   . Diabetes Daughter   . Diabetes Sister     Allergies as of 02/19/2014  . (No Known Allergies)    Current Outpatient Prescriptions on File Prior to Visit  Medication Sig Dispense Refill  . alendronate (FOSAMAX) 70 MG tablet Take 1 tablet (70 mg total) by mouth every 7 (seven) days. Take with a full glass  of water on an empty stomach.  4 tablet  11  . aspirin 81 MG tablet Take 81 mg by mouth daily.      . Calcium Carbonate-Vitamin D (CALCIUM 600 + D PO) Take 1 tablet by mouth every morning.      . cloNIDine (CATAPRES) 0.2 MG tablet TAKE ONE TABLET BY MOUTH TWICE DAILY  60 tablet  3  . co-enzyme Q-10 30 MG capsule Take 100 mg by mouth daily.      . fenofibrate (TRICOR) 145 MG tablet Take 1 tablet (145 mg total) by mouth daily.  30 tablet  3  . glipiZIDE (GLUCOTROL) 5 MG tablet Take 1 tablet (5 mg total) by mouth 2 (two) times daily before a meal.  60 tablet  1  . insulin glargine (LANTUS) 100 UNIT/ML injection Inject 0.4 mLs (40 Units total) into the skin at bedtime.  10 mL  2  . insulin lispro (HUMALOG) 100 UNIT/ML injection Inject 3 times daily per sliding scale.  10 mL  12  . Insulin Syringe-Needle U-100 (RELION INSULIN  SYR 0.3CC/30G) 30G X 5/16" 0.3 ML MISC Inject 1 each into the skin 2 (two) times daily.      Marland Kitchen linagliptin (TRADJENTA) 5 MG TABS tablet Take 1 tablet (5 mg total) by mouth daily.  30 tablet  3  . omeprazole (PRILOSEC) 20 MG capsule Take 1 capsule (20 mg total) by mouth every morning.  30 capsule  5  . potassium gluconate 595 MG TABS tablet Take 595 mg by mouth daily.      . rosuvastatin (CRESTOR) 20 MG tablet Take 1 tablet (20 mg total) by mouth daily.  90 tablet  3  . valsartan-hydrochlorothiazide (DIOVAN-HCT) 160-25 MG per tablet Take 1 tablet by mouth daily.  30 tablet  2  . moxifloxacin (AVELOX) 400 MG tablet Take 1 tablet (400 mg total) by mouth daily.  7 tablet  0   No current facility-administered medications on file prior to visit.     REVIEW OF SYSTEMS: Cardiovascular: Positive for shortness of breath with exertion, pain in legs and walking, pain in legs underlying flat, leg swelling  No history of DVT or phlebitis. Pulmonary: No productive cough, asthma or wheezing. Neurologic: Positive for numbness in her legs. No dizziness. Hematologic: No bleeding problems or clotting disorders. Musculoskeletal: No joint pain or joint swelling. Gastrointestinal: No blood in stool or hematemesis Genitourinary: No dysuria or hematuria. Psychiatric:: No history of major depression. Integumentary: No rashes or ulcers. Constitutional: No fever or chills.  PHYSICAL EXAMINATION: General: The patient appears their stated age.  Vital signs are BP 131/62  Pulse 79  Resp 16  Ht 4\' 11"  (1.499 m)  Wt 139 lb (63.05 kg)  BMI 28.06 kg/m2  LMP 07/06/1978 HEENT:  No gross abnormalities Pulmonary: Respirations are non-labored Abdomen: Soft and non-tender  Musculoskeletal: There are no major deformities.   Neurologic: No focal weakness or paresthesias are detected, Skin: There are no ulcer or rashes noted. Psychiatric: The patient has normal affect. Cardiovascular: There is a regular rate and rhythm  without significant murmur appreciated.  Nonpalpable pedal or popliteal pulse bilaterally.  No carotid bruits.  Diagnostic Studies: I have reviewed the report from her outside ultrasound.  This shows an ankle-brachial index of 0.5 on the right and 0.95 on the left.  There was a right mid SFA stenosis identified and possible left inflow disease.   Assessment:  Peripheral vascular disease Plan: #1: With regards to her lower extremity peripheral vascular  disease, she has Doppler studies which suggest diminished blood flow to the right leg.  Essentially, however I feel that the patient is relatively asymptomatic.  Her symptoms are not consistent with claudication.  Her biggest complaint seems to be that of edema as well as neuropathy.  There is the possibility of inflow disease on the left but her ankle-brachial index is 0.95.  I described in detail the management of peripheral vascular disease with the patient.  We discussed the importance of daily monitoring of her foot and to contact me should she develop a wound or infection.  I have given her a prescription for cilostazol to see if this will help with her walking.  She will followup again with me in 3 months.  #2 colon carotid disease: The patient has a history of stroke of unknown etiology.  Her most recent lifeline screening evaluation showed mild carotid disease.  I plan on repeating this in my office in one year.     Eldridge Abrahams, M.D. [facility] Office: [phone] Pager:  [phone]

## 2014-02-21 ENCOUNTER — Telehealth: Payer: Self-pay

## 2014-02-21 DIAGNOSIS — I739 Peripheral vascular disease, unspecified: Secondary | ICD-10-CM

## 2014-02-21 MED ORDER — CILOSTAZOL 100 MG PO TABS: 100.0000 mg | ORAL_TABLET | Freq: Two times a day (BID) | ORAL | Status: DC

## 2014-02-21 NOTE — Telephone Encounter (Signed)
Pt. notified of recommendation to start Cilostazol 100 mg BID, and of Rx sent through, electronically to the pharmacy.  Verb. Understanding.

## 2014-02-21 NOTE — Addendum Note (Signed)
Addended by: Mena Goes on: 02/21/2014 10:02 AM   Modules accepted: Orders

## 2014-02-21 NOTE — Telephone Encounter (Signed)
Message copied by Denman George on Wed Feb 21, 2014 12:02 PM ------      Message from: Serafina Mitchell      Created: Wed Feb 21, 2014 10:09 AM      Regarding: FW: Rx for Cilostazol       100 mg bid      ----- Message -----         From: Sherrye Payor, RN         Sent: 02/20/2014   1:33 PM           To: Serafina Mitchell, MD      Subject: Rx for Cilostazol                                        This pt. said the Cilostazol didn't get sent to her pharmacy.  What dose/ frequency do you want me to order?       ------

## 2014-02-27 ENCOUNTER — Other Ambulatory Visit: Payer: Self-pay | Admitting: Family

## 2014-03-02 ENCOUNTER — Ambulatory Visit: Payer: Medicare Other | Admitting: Internal Medicine

## 2014-03-06 ENCOUNTER — Other Ambulatory Visit: Payer: Self-pay | Admitting: Family

## 2014-03-13 ENCOUNTER — Ambulatory Visit: Payer: Medicare Other | Admitting: Family

## 2014-03-14 ENCOUNTER — Ambulatory Visit (INDEPENDENT_AMBULATORY_CARE_PROVIDER_SITE_OTHER): Payer: Medicare Other | Admitting: Family

## 2014-03-14 ENCOUNTER — Encounter: Payer: Self-pay | Admitting: Family

## 2014-03-14 VITALS — BP 130/64 | HR 92 | Temp 97.5°F | Resp 18 | Ht 59.0 in | Wt 138.2 lb

## 2014-03-14 DIAGNOSIS — I739 Peripheral vascular disease, unspecified: Secondary | ICD-10-CM

## 2014-03-14 DIAGNOSIS — E119 Type 2 diabetes mellitus without complications: Secondary | ICD-10-CM

## 2014-03-14 DIAGNOSIS — R6881 Early satiety: Secondary | ICD-10-CM

## 2014-03-14 DIAGNOSIS — E785 Hyperlipidemia, unspecified: Secondary | ICD-10-CM

## 2014-03-14 DIAGNOSIS — E1149 Type 2 diabetes mellitus with other diabetic neurological complication: Secondary | ICD-10-CM

## 2014-03-14 DIAGNOSIS — I10 Essential (primary) hypertension: Secondary | ICD-10-CM

## 2014-03-14 DIAGNOSIS — Z23 Encounter for immunization: Secondary | ICD-10-CM

## 2014-03-14 DIAGNOSIS — R918 Other nonspecific abnormal finding of lung field: Secondary | ICD-10-CM

## 2014-03-14 NOTE — Patient Instructions (Addendum)
Please complete your lab work prior to leaving. Keep your upcoming appointment with Dr. Cruzita Lederer and GI. Follow up in 3 months.

## 2014-03-14 NOTE — Progress Notes (Signed)
Subjective:    Patient ID: Michaela Shaw, female    DOB: 1940-08-30, 73 y.o.   MRN: [id number]  HPI  Michaela Shaw is a 73 yr old female who presents today for follow up.  1) DM2- Maintained on lantus and humolog.  She has follow up with Dr. Cruzita Lederer next week.    Lab Results  Component Value Date   HGBA1C 8.5* 12/11/2013   HGBA1C 8.8* 09/06/2013   HGBA1C 9.3* 06/06/2013   Lab Results  Component Value Date   MICROALBUR 18.55* 03/03/2013   LDLCALC NOT CALC 12/11/2013   CREATININE 1.61* 12/11/2013   Last eye exam was <1 yr ago. Would like a flu shot.    2) HTN- BP meds include catapres and diovan hct.  BP Readings from Last 3 Encounters:  03/14/14 130/64  02/19/14 131/62  01/24/14 138/70   3) Hyperlipidemia- maintained on crestor and tricor. Reports + dietary compliance.   Lab Results  Component Value Date   CHOL 274* 12/11/2013   HDL 39* 12/11/2013   LDLCALC NOT CALC 12/11/2013   TRIG 496* 12/11/2013   CHOLHDL 7.0 12/11/2013   4) PVD- she saw Dr. Trula Slade and he rx'd pletal to see if this helps with her pain.  She reports that she has less aching in her legs since she started pletal.   5) Early satiety- has apt to establish with GI in October.  Review of Systems See HPI  Past Medical History  Diagnosis Date  . History of chicken pox   . Diabetes mellitus without complication   . Hyperlipidemia   . Hypertension   . Stroke 2011    "mild" per pt.   . Seizures 2005    History   Social History  . Marital Status: Legally Separated    Spouse Name: N/A    Number of Children: N/A  . Years of Education: N/A   Occupational History  . Not on file.   Social History Main Topics  . Smoking status: Never Smoker   . Smokeless tobacco: Never Used  . Alcohol Use: No  . Drug Use: No  . Sexual Activity: Not on file   Other Topics Concern  . Not on file   Social History Narrative   Son living with her, lives with daughter   RetiredPhotographer, 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 Bladder suspension  1980  . Abdominal hysterectomy  1980    partial  . Cholecystectomy  2007  . Appendectomy  2007  . Spine surgery  2012    Has rods and pins    Family History  Problem Relation Age of Onset  . Heart disease Mother   . Diabetes Mother   . Alzheimer's disease Father   . Cancer Sister     colon  . Diabetes Sister   . Diabetes Daughter   . Diabetes Sister     No Known Allergies  Current Outpatient Prescriptions on File Prior to Visit  Medication Sig Dispense Refill  . alendronate (FOSAMAX) 70 MG tablet Take 1 tablet (70 mg total) by mouth every 7 (seven) days. Take with a full glass of water on an empty stomach.  4 tablet  11  . aspirin 81 MG tablet Take 81 mg by mouth daily.      Marland Kitchen  Calcium Carbonate-Vitamin D (CALCIUM 600 + D PO) Take 1 tablet by mouth every morning.      . cilostazol (PLETAL) 100 MG tablet Take 1 tablet (100 mg total) by mouth 2 (two) times daily before a meal.  60 tablet  11  . cloNIDine (CATAPRES) 0.2 MG tablet TAKE ONE TABLET BY MOUTH TWICE DAILY  60 tablet  3  . co-enzyme Q-10 30 MG capsule Take 100 mg by mouth daily.      . fenofibrate (TRICOR) 145 MG tablet TAKE ONE TABLET BY MOUTH ONCE DAILY  30 tablet  3  . glipiZIDE (GLUCOTROL) 5 MG tablet Take 1 tablet (5 mg total) by mouth 2 (two) times daily before a meal.  60 tablet  1  . insulin glargine (LANTUS) 100 UNIT/ML injection Inject 0.4 mLs (40 Units total) into the skin at bedtime.  10 mL  2  . insulin lispro (HUMALOG) 100 UNIT/ML injection Inject 3 times daily per sliding scale.  10 mL  12  . Insulin Syringe-Needle U-100 (RELION INSULIN SYR 0.3CC/30G) 30G X 5/16" 0.3 ML MISC Inject 1 each into the skin 2 (two) times daily.      Marland Kitchen linagliptin (TRADJENTA) 5 MG TABS tablet Take 1 tablet (5 mg total) by mouth daily.  30 tablet  3  . omeprazole (PRILOSEC) 20 MG capsule  Take 1 capsule (20 mg total) by mouth every morning.  30 capsule  5  . potassium gluconate 595 MG TABS tablet Take 595 mg by mouth daily.      . rosuvastatin (CRESTOR) 20 MG tablet Take 1 tablet (20 mg total) by mouth daily.  90 tablet  3  . valsartan-hydrochlorothiazide (DIOVAN-HCT) 160-25 MG per tablet TAKE ONE TABLET BY MOUTH ONCE DAILY  30 tablet  0   No current facility-administered medications on file prior to visit.    BP 130/64  Pulse 92  Temp(Src) 97.5 F (36.4 C) (Oral)  Resp 18  Ht 4\' 11"  (1.499 m)  Wt 138 lb 3.2 oz (62.687 kg)  BMI 27.90 kg/m2  SpO2 97%  LMP 07/06/1978       Objective:   Physical Exam  Constitutional: She is oriented to person, place, and time. She appears well-developed and well-nourished. No distress.  Cardiovascular: Normal rate and regular rhythm.   No murmur heard. Pulmonary/Chest: Effort normal and breath sounds normal. No respiratory distress. She has no wheezes. She has no rales. She exhibits no tenderness.  Abdominal: Soft.  Musculoskeletal: She exhibits no edema.  Neurological: She is alert and oriented to person, place, and time.  Psychiatric: She has a normal mood and affect. Her behavior is normal. Judgment and thought content normal.          Assessment & Plan:

## 2014-03-15 LAB — BASIC METABOLIC PANEL
BUN: 26 mg/dL — ABNORMAL HIGH (ref 6–23)
CO2: 24 mEq/L (ref 19–32)
Calcium: 10 mg/dL (ref 8.4–10.5)
Chloride: 98 mEq/L (ref 96–112)
Creatinine, Ser: 2.5 mg/dL — ABNORMAL HIGH (ref 0.4–1.2)
GFR: 19.75 mL/min — AB (ref >60.00)
GLUCOSE: 194 mg/dL — AB (ref 70–99)
Potassium: 4.3 mEq/L (ref 3.5–5.1)
SODIUM: 132 meq/L — AB (ref 135–145)

## 2014-03-15 LAB — HEMOGLOBIN A1C: HEMOGLOBIN A1C: 8 % — AB (ref 4.6–6.5)

## 2014-03-15 LAB — LIPID PANEL
CHOLESTEROL: 153 mg/dL (ref 0–200)
HDL: 33.5 mg/dL — ABNORMAL LOW (ref >39.00)
NonHDL: 119.5
Total CHOL/HDL Ratio: 5
Triglycerides: 259 mg/dL — ABNORMAL HIGH (ref 0.0–149.0)
VLDL: 51.8 mg/dL — ABNORMAL HIGH (ref 0.0–40.0)

## 2014-03-15 LAB — LDL CHOLESTEROL, DIRECT: LDL DIRECT: 84.8 mg/dL

## 2014-03-15 NOTE — Assessment & Plan Note (Signed)
Mild, following with Dr. Trula Slade.  Some symptomatic improvement on pletal.  Monitor.

## 2014-03-15 NOTE — Assessment & Plan Note (Signed)
Management per Endo, pt to keep upcoming apt. Obtain A1c. Flu shot today.

## 2014-03-15 NOTE — Assessment & Plan Note (Signed)
Obtain follow up FLP.  Continue crestor/tricor.

## 2014-03-15 NOTE — Assessment & Plan Note (Signed)
Needs follow up CT 1/16.

## 2014-03-15 NOTE — Assessment & Plan Note (Signed)
BP stable on current meds. Continue same. Obtain follow up bmet.

## 2014-03-15 NOTE — Assessment & Plan Note (Signed)
Unchanged.  Pt to keep upcoming GI consult.  Weight fairly stable.   Wt Readings from Last 3 Encounters:  03/14/14 138 lb 3.2 oz (62.687 kg)  02/19/14 139 lb (63.05 kg)  01/24/14 144 lb 1.3 oz (65.354 kg)

## 2014-03-18 ENCOUNTER — Telehealth: Payer: Self-pay | Admitting: Family

## 2014-03-18 MED ORDER — AMLODIPINE BESYLATE 5 MG PO TABS: 5.0000 mg | ORAL_TABLET | Freq: Every day | ORAL | Status: DC

## 2014-03-18 NOTE — Telephone Encounter (Addendum)
Please contact pt and let her know that her kidney function has worsened. Sodium is a little low.  Is she taking any NSAIDS?  If so, discontinue.  Also, I would like her to stop diovan hct. Start amlodipine.   Sugar is uncontrolled.  Increase lantus from 40 to 45 units.   Cholesterol is much better, continue crestor and tricor.     Follow up in 1 week for office visit.

## 2014-03-19 NOTE — Telephone Encounter (Signed)
Left message to return my call.  

## 2014-03-20 NOTE — Telephone Encounter (Signed)
Notified pt. She denies use of NSAIDS; has been taking tylenol at night. Pt will Dr Florene Glen on 04/12/14 for her kidneys.  Voices understanding re: BP med change and reports that she was previously taking 35 units of Lantus due to her hypoglycemic episodes. Advised pt to increase to 40 units instead of 45 as below. Scheduled 1 week f/u for 03/27/14 at 10:30am. Please advise if further instructions.

## 2014-03-20 NOTE — Telephone Encounter (Signed)
Noted  

## 2014-03-20 NOTE — Telephone Encounter (Signed)
Patient returned phone call. Best# [phone]

## 2014-03-20 NOTE — Telephone Encounter (Signed)
Left message on voicemail to return my call.  

## 2014-03-21 ENCOUNTER — Telehealth: Payer: Self-pay | Admitting: *Deleted

## 2014-03-21 NOTE — Telephone Encounter (Signed)
Received fax from [facility] in [place] wanting to provide pt with 90 day supply of valsartan HCTZ 160/25mg . It apears that medication has "fallen off" med list? Please advise.

## 2014-03-21 NOTE — Telephone Encounter (Signed)
Rx stopped on 9/13 and pt was instructed to start amlodipine instead due to kidney function.

## 2014-03-21 NOTE — Telephone Encounter (Signed)
Response faxed back to [phone].

## 2014-03-27 ENCOUNTER — Ambulatory Visit: Payer: Medicare Other | Admitting: Family

## 2014-03-28 ENCOUNTER — Encounter: Payer: Self-pay | Admitting: Family

## 2014-03-28 ENCOUNTER — Ambulatory Visit (INDEPENDENT_AMBULATORY_CARE_PROVIDER_SITE_OTHER): Payer: Medicare Other | Admitting: Family

## 2014-03-28 VITALS — BP 166/84 | HR 86 | Temp 98.5°F | Resp 16 | Ht 59.0 in | Wt 140.1 lb

## 2014-03-28 DIAGNOSIS — N189 Chronic kidney disease, unspecified: Secondary | ICD-10-CM

## 2014-03-28 DIAGNOSIS — E1149 Type 2 diabetes mellitus with other diabetic neurological complication: Secondary | ICD-10-CM

## 2014-03-28 DIAGNOSIS — N289 Disorder of kidney and ureter, unspecified: Secondary | ICD-10-CM

## 2014-03-28 NOTE — Progress Notes (Signed)
Pre visit review using our clinic review tool, if applicable. No additional management support is needed unless otherwise documented below in the visit note/SLS  

## 2014-03-28 NOTE — Patient Instructions (Signed)
Decrease lantus back to 35 units until you see Dr. Cruzita Lederer next week. Please complete lab work prior to leaving.

## 2014-03-28 NOTE — Progress Notes (Signed)
Subjective:    Patient ID: Michaela Shaw, female    DOB: 1940-07-29, 73 y.o.   MRN: [id number]  HPI   Michaela Shaw is a 73 yr old female who presents today for follow up.  Last visit Creatinine was noted to be elevated at 2.5.  Her baseline creatinine is about 1.5-1.6. She denies NSAID use.  She sees Dr. Florene Glen, her nephrologist, next week.  Sugar was also elevated and she was instructed to increase lantus from 35 to 40 units.  Has had hypoglycemia with a sugar down into the 40's.  Her postprandial sugars are often >200 however.    Lab Results  Component Value Date   HGBA1C 8.0* 03/14/2014    Review of Systems See HPI  Past Medical History  Diagnosis Date  . History of chicken pox   . Diabetes mellitus without complication   . Hyperlipidemia   . Hypertension   . Stroke 2011    "mild" per pt.   . Seizures 2005    History   Social History  . Marital Status: Legally Separated    Spouse Name: N/A    Number of Children: N/A  . Years of Education: N/A   Occupational History  . Not on file.   Social History Main Topics  . Smoking status: Never Smoker   . Smokeless tobacco: Never Used  . Alcohol Use: No  . Drug Use: No  . Sexual Activity: Not on file   Other Topics Concern  . Not on file   Social History Narrative   Son living with her, lives with daughter   RetiredPhotographer, Start work- Psychologist, counselling   Enjoys crafts shows   Completed 11 th grade   3 grand children                Past Surgical History  Procedure Laterality Date  . Bladder suspension  1980  . Abdominal hysterectomy  1980    partial  . Cholecystectomy  2007  . Appendectomy  2007  . Spine surgery  2012    Has rods and pins    Family History  Problem Relation Age of Onset  . Heart disease Mother   . Diabetes Mother   . Alzheimer's disease Father   . Cancer Sister     colon  . Diabetes Sister   . Diabetes Daughter   . Diabetes Sister     Allergies  Allergen Reactions  .  Metformin And Related Other (See Comments)    Kidney function    Current Outpatient Prescriptions on File Prior to Visit  Medication Sig Dispense Refill  . alendronate (FOSAMAX) 70 MG tablet Take 1 tablet (70 mg total) by mouth every 7 (seven) days. Take with a full glass of water on an empty stomach.  4 tablet  11  . amLODipine (NORVASC) 5 MG tablet Take 1 tablet (5 mg total) by mouth daily.  30 tablet  2  . aspirin 81 MG tablet Take 81 mg by mouth daily.      . Calcium Carbonate-Vitamin D (CALCIUM 600 + D PO) Take 1 tablet by mouth every morning.      . cilostazol (PLETAL) 100 MG tablet Take 1 tablet (100 mg total) by mouth 2 (two) times daily before a meal.  60 tablet  11  . cloNIDine (CATAPRES) 0.2 MG tablet TAKE ONE TABLET BY MOUTH TWICE DAILY  60 tablet  3  . co-enzyme Q-10 30 MG capsule Take 100 mg by  mouth daily.      . fenofibrate (TRICOR) 145 MG tablet TAKE ONE TABLET BY MOUTH ONCE DAILY  30 tablet  3  . glipiZIDE (GLUCOTROL) 5 MG tablet Take 1 tablet (5 mg total) by mouth 2 (two) times daily before a meal.  60 tablet  1  . insulin glargine (LANTUS) 100 UNIT/ML injection Inject 40 Units into the skin at bedtime.       . insulin lispro (HUMALOG) 100 UNIT/ML injection Inject 3 times daily per sliding scale.  10 mL  12  . Insulin Syringe-Needle U-100 (RELION INSULIN SYR 0.3CC/30G) 30G X 5/16" 0.3 ML MISC Inject 1 each into the skin 2 (two) times daily.      Marland Kitchen linagliptin (TRADJENTA) 5 MG TABS tablet Take 1 tablet (5 mg total) by mouth daily.  30 tablet  3  . omeprazole (PRILOSEC) 20 MG capsule Take 1 capsule (20 mg total) by mouth every morning.  30 capsule  5  . potassium gluconate 595 MG TABS tablet Take 595 mg by mouth daily.      . rosuvastatin (CRESTOR) 20 MG tablet Take 1 tablet (20 mg total) by mouth daily.  90 tablet  3   No current facility-administered medications on file prior to visit.    BP 166/84  Pulse 86  Temp(Src) 98.5 F (36.9 C) (Oral)  Resp 16  Ht 4\' 11"   (1.499 m)  Wt 140 lb 2 oz (63.56 kg)  BMI 28.29 kg/m2  SpO2 100%  LMP 07/06/1978       Objective:   Physical Exam  Constitutional: She is oriented to person, place, and time. She appears well-developed and well-nourished. No distress.  Cardiovascular: Normal rate and regular rhythm.   No murmur heard. Pulmonary/Chest: Effort normal and breath sounds normal. No respiratory distress. She has no wheezes. She has no rales. She exhibits no tenderness.  Neurological: She is alert and oriented to person, place, and time.  Psychiatric: She has a normal mood and affect. Her behavior is normal. Judgment and thought content normal.          Assessment & Plan:

## 2014-03-29 LAB — BASIC METABOLIC PANEL
BUN: 22 mg/dL (ref 6–23)
CO2: 23 mEq/L (ref 19–32)
Calcium: 9.5 mg/dL (ref 8.4–10.5)
Chloride: 102 mEq/L (ref 96–112)
Creatinine, Ser: 1.9 mg/dL — ABNORMAL HIGH (ref 0.4–1.2)
GFR: 27.65 mL/min — ABNORMAL LOW (ref >60.00)
Glucose, Bld: 101 mg/dL — ABNORMAL HIGH (ref 70–99)
POTASSIUM: 4.3 meq/L (ref 3.5–5.1)
SODIUM: 134 meq/L — AB (ref 135–145)

## 2014-03-31 DIAGNOSIS — N189 Chronic kidney disease, unspecified: Secondary | ICD-10-CM | POA: Insufficient documentation

## 2014-03-31 NOTE — Assessment & Plan Note (Signed)
Lab Results  Component Value Date   CREATININE 1.9* 03/28/2014    Follow up creatinine is improved.  Pt is instructed to keep her upcoming appointment with Dr. Florene Glen.

## 2014-03-31 NOTE — Assessment & Plan Note (Signed)
Uncontrolled.  I advised pt to decrease lantus back down to 35 and to keep her upcoming appointment with endocrinology.

## 2014-04-02 ENCOUNTER — Telehealth: Payer: Self-pay | Admitting: Family

## 2014-04-02 NOTE — Telephone Encounter (Signed)
See result note.  

## 2014-04-02 NOTE — Telephone Encounter (Signed)
Pt is returning your call. States you called on Friday.

## 2014-04-03 ENCOUNTER — Telehealth: Payer: Self-pay | Admitting: Family

## 2014-04-03 NOTE — Telephone Encounter (Signed)
See result note. Pt has been notified.

## 2014-04-03 NOTE — Telephone Encounter (Signed)
Returning call to nurse

## 2014-04-09 ENCOUNTER — Ambulatory Visit (INDEPENDENT_AMBULATORY_CARE_PROVIDER_SITE_OTHER): Payer: Medicare Other | Admitting: Internal Medicine

## 2014-04-09 ENCOUNTER — Encounter: Payer: Self-pay | Admitting: Internal Medicine

## 2014-04-09 ENCOUNTER — Other Ambulatory Visit: Payer: Self-pay | Admitting: *Deleted

## 2014-04-09 ENCOUNTER — Telehealth: Payer: Self-pay | Admitting: Internal Medicine

## 2014-04-09 VITALS — BP 130/76 | HR 104 | Ht 59.0 in | Wt 141.0 lb

## 2014-04-09 VITALS — BP 132/72 | HR 100 | Temp 98.1°F | Resp 12 | Wt 141.0 lb

## 2014-04-09 DIAGNOSIS — R140 Abdominal distension (gaseous): Secondary | ICD-10-CM

## 2014-04-09 DIAGNOSIS — R6881 Early satiety: Secondary | ICD-10-CM

## 2014-04-09 DIAGNOSIS — K219 Gastro-esophageal reflux disease without esophagitis: Secondary | ICD-10-CM

## 2014-04-09 DIAGNOSIS — K5900 Constipation, unspecified: Secondary | ICD-10-CM

## 2014-04-09 DIAGNOSIS — IMO0002 Reserved for concepts with insufficient information to code with codable children: Secondary | ICD-10-CM

## 2014-04-09 DIAGNOSIS — E1165 Type 2 diabetes mellitus with hyperglycemia: Secondary | ICD-10-CM

## 2014-04-09 DIAGNOSIS — Z800 Family history of malignant neoplasm of digestive organs: Secondary | ICD-10-CM

## 2014-04-09 DIAGNOSIS — E1140 Type 2 diabetes mellitus with diabetic neuropathy, unspecified: Secondary | ICD-10-CM

## 2014-04-09 DIAGNOSIS — R634 Abnormal weight loss: Secondary | ICD-10-CM

## 2014-04-09 MED ORDER — INSULIN LISPRO 100 UNIT/ML ~~LOC~~ SOLN: SUBCUTANEOUS | Status: DC

## 2014-04-09 MED ORDER — INSULIN GLARGINE 100 UNIT/ML ~~LOC~~ SOLN: 30.0000 [IU] | Freq: Every day | SUBCUTANEOUS | Status: DC

## 2014-04-09 NOTE — Telephone Encounter (Signed)
Returned pt's call. Pt wanted to clarify the sliding scale, to be sure she understood it correctly. Also, pt called her ins co and they said at this time they do not cover the Vgo, but that Dr Cruzita Lederer could do a PA letting them know that this would better help her regulate her sugars and insulin they may cover it. Be advised.

## 2014-04-09 NOTE — Patient Instructions (Addendum)
You have been scheduled for an endoscopy. Please follow written instructions given to you at your visit today. If you use inhalers (even only as needed), please bring them with you on the day of your procedure. Your physician has requested that you go to www.[email or web address].[email or web address] and enter the access code given to you at your visit today. This web site gives a general overview about your procedure. However, you should still follow specific instructions given to you by our office regarding your preparation for the procedure.  Stay on Pletal per Dr Hilarie Fredrickson

## 2014-04-09 NOTE — Patient Instructions (Addendum)
Please continue: - Tradjenta 5 mg daily in am until you run out, then stop Decrease: - Lantus to 30 units at bedtime Stop: - Glipizide Start Humalog mealtime insulin: 5 units with a smaller meal 7 units with a regular meal 9 units with a larger meal or if you have dessert Continue the Humalog Sliding scale: - 150-175: + 1 unit  - 176-200: + 2 units  - 201-225: + 3 units  - >226: + 4 units   Please return in 1 month with your sugar log.   Please let me know if the sugars are consistently <80 or >200.

## 2014-04-09 NOTE — Telephone Encounter (Signed)
Michaela Shaw, Please read her the SSI from my note:  - 150-175: + 1 unit  - 176-200: + 2 units  - 201-225: + 3 units  - >226: + 4 units  Let's try a PA for a VGo. Thank you, C

## 2014-04-09 NOTE — Telephone Encounter (Signed)
Patient has questions about her medications. She was seen today.

## 2014-04-09 NOTE — Progress Notes (Addendum)
Patient ID: Michaela Shaw, female   DOB: Apr 11, 1941, 73 y.o.   MRN: [id number]  HPI: Michaela Shaw is a 73 y.o.-year-old female, returning for f/u for DM2, dx 2005, insulin-dependent since 2007, uncontrolled, with complications (cerebrovascular disease - h/o stroke in 2009, CKD, DR, PN). Last visit 2.5 mo ago.  Last hemoglobin A1c was: Lab Results  Component Value Date   HGBA1C 8.0* 03/14/2014   HGBA1C 8.5* 12/11/2013   HGBA1C 8.8* 09/06/2013   Pt is on a regimen of: - Januvia 100 mg in am >> Tradjenta 5 mg daily - Glipizide 5 mg bid - Lantus 72 >> 63 >> 40 >> 35 units qhs  - Humalog SSI tid ac: target 150, ISF 25 (2 units for every 50).  She is on a DM study med - once a week.  Pt checks her sugars 1-3x a day and they are: - am: 27, 35, 39 - 257 >> 71, 76-91, 150 this am - 2h after b'fast: n/c - before lunch: 126 >> 160-230 - 2h after lunch: n/c - before dinner: n/c >> 160-230 - 2h after dinner: n/c - bedtime: n/c Has lows. Lowest sugar was 27!!!; No hypoglycemia awareness! Highest sugar was 330s.  Pt's meals are: - Breakfast:1/2 bagel - Lunch: 1/2 bagel - Dinner: chicken + veggies - Snacks: 1  - Has CKD, last BUN/creatinine:  Lab Results  Component Value Date   BUN 22 03/28/2014   CREATININE 1.9* 03/28/2014   - last set of lipids: Lab Results  Component Value Date   CHOL 153 03/14/2014   HDL 33.50* 03/14/2014   LDLCALC NOT CALC 12/11/2013   LDLDIRECT 84.8 03/14/2014   TRIG 259.0* 03/14/2014   CHOLHDL 5 03/14/2014  She is on Crestor, Tricor. - She has + DR. Last eye exam: 2013. Has an appt in 05/2014. - + numbness and tingling in her feet.  I reviewed pt's medications, allergies, PMH, social hx, family hx and no changes required, except as mentioned above + started Norvasc, stopped Valsartan.  ROS: Constitutional: + weight loss, no fatigue, no subjective hyperthermia/hypothermia Eyes: no blurry vision, no xerophthalmia ENT: no sore throat, no nodules palpated in throat, no  dysphagia/odynophagia, no hoarseness Cardiovascular: no CP/SOB/palpitations/leg swelling Respiratory: no cough/SOB Gastrointestinal: no N/V/D/C Musculoskeletal: no muscle/joint aches Skin: no rashes Neurological: no tremors/numbness/tingling/dizziness  PE: BP 132/72  Pulse 100  Temp(Src) 98.1 F (36.7 C) (Oral)  Resp 12  Wt 141 lb (63.957 kg)  SpO2 96%  LMP 07/06/1978 Body mass index is 28.46 kg/(m^2).   Wt Readings from Last 3 Encounters:  04/09/14 141 lb (63.957 kg)  04/09/14 141 lb (63.957 kg)  03/28/14 140 lb 2 oz (63.56 kg)   Constitutional: overweight, in NAD Eyes: PERRLA, EOMI, no exophthalmos ENT: moist mucous membranes, no thyromegaly, no cervical lymphadenopathy Cardiovascular: RRR, No MRG Respiratory: CTA B Gastrointestinal: abdomen soft, NT, ND, BS+ Musculoskeletal: no deformities, strength intact in all 4 Skin: moist, warm, no rashes Neurological: no tremor with outstretched hands, DTR normal in all 4  ASSESSMENT: 1. DM2, insulin-dependent, uncontrolled, with complications - cerebrovascular disease - h/o stroke in 2009 - CKD - DR - PN  PLAN:  1. Patient with long-standing, uncontrolled diabetes, on oral antidiabetic regimen + basal insulin, with no more severe low CBGs but still a staircase effect of increase sugars throughout the day. We will switch to mealtime insulin.  - she is interested in a VGo, will find out if her insurance ([place]) will cover this. Until then: Patient  Instructions  Please continue: - Tradjenta 5 mg daily in am until you run out, then stop Decrease: - Lantus to 30 units at bedtime Stop: - Glipizide Start Humalog mealtime insulin: 5 units with a smaller meal 7 units with a regular meal 9 units with a larger meal or if you have dessert Continue the Humalog Sliding scale: - 150-175: + 1 unit  - 176-200: + 2 units  - 201-225: + 3 units  - >226: + 4 units  Please return in 1 month with your sugar log.  Please let me know if the  sugars are consistently <80 or >200. - continue checking sugars at different times of the day - check 3 times a day, rotating checks - advised for yearly eye exams >> has one coming up - Return to clinic in 1 mo with sugar log   PA completed >> VGo 20 approved by insurance - Rx's sent to [facility]

## 2014-04-09 NOTE — Progress Notes (Signed)
Patient ID: Michaela Shaw, female   DOB: [birth year], 73 y.o.   MRN: [id number] HPI: Michaela Shaw is a 73 year old female with past medical history of diabetes, hypertension, hyperlipidemia, peripheral vascular disease with right lower extremity claudication and remote seizure disorder who is seen in consultation at the request of Debbrah Alar to evaluate early satiety and weight loss. She is here today with her daughter. She reports that over the last several months and perhaps longer she has noted poor appetite and early satiety. This is associated with a 20-25 pound weight loss. She is not upset about the weight loss because she reports she needed to lose weight. She is eating smaller amounts of food and feeling full quickly. She has a history of heartburn but this is completely controlled with omeprazole 20 mg daily. She denies nausea or vomiting. No dysphagia or odynophagia. 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 She does endorse increased gas and bloating. She's had constipation and is using stool softeners but at times she has small stools. She denies narrow caliber stools, rectal bleeding or melena. She does have a family history of colorectal cancer in her sister and had a colonoscopy 2 years ago. Repeat colonoscopy was recommended in September of 2016  Past Medical History  Diagnosis Date  . History of chicken pox   . Diabetes mellitus without complication   . Hyperlipidemia   . Hypertension   . Stroke 2011    "mild" per pt.   . Seizures 2005    Past Surgical History  Procedure Laterality Date  . Bladder suspension  1980  . Abdominal hysterectomy  1980    partial  . Cholecystectomy  2007  . Appendectomy  2007  . Spine surgery  2012    Has rods and pins    Outpatient Prescriptions Prior to Visit  Medication Sig Dispense Refill  . alendronate (FOSAMAX) 70 MG  tablet Take 1 tablet (70 mg total) by mouth every 7 (seven) days. Take with a full glass of water on an empty stomach.  4 tablet  11  . amLODipine (NORVASC) 5 MG tablet Take 1 tablet (5 mg total) by mouth daily.  30 tablet  2  . aspirin 81 MG tablet Take 81 mg by mouth daily.      . Calcium Carbonate-Vitamin D (CALCIUM 600 + D PO) Take 1 tablet by mouth every morning.      . cilostazol (PLETAL) 100 MG tablet Take 1 tablet (100 mg total) by mouth 2 (two) times daily before a meal.  60 tablet  11  . cloNIDine (CATAPRES) 0.2 MG tablet TAKE ONE TABLET BY MOUTH TWICE DAILY  60 tablet  3  . co-enzyme Q-10 30 MG capsule Take 100 mg by mouth daily.      . fenofibrate (TRICOR) 145 MG tablet TAKE ONE TABLET BY MOUTH ONCE DAILY  30 tablet  3  . glipiZIDE (GLUCOTROL) 5 MG tablet Take 1 tablet (5 mg total) by mouth 2 (two) times daily before a meal.  60 tablet  1  . insulin glargine (LANTUS) 100 UNIT/ML injection Inject 40 Units into the skin at bedtime.       . insulin lispro (HUMALOG) 100 UNIT/ML injection Inject 3 times daily per sliding scale.  10 mL  12  . Insulin Syringe-Needle U-100 (RELION INSULIN SYR 0.3CC/30G) 30G X 5/16" 0.3 ML MISC Inject  1 each into the skin 2 (two) times daily.      Marland Kitchen linagliptin (TRADJENTA) 5 MG TABS tablet Take 1 tablet (5 mg total) by mouth daily.  30 tablet  3  . omeprazole (PRILOSEC) 20 MG capsule Take 1 capsule (20 mg total) by mouth every morning.  30 capsule  5  . potassium gluconate 595 MG TABS tablet Take 595 mg by mouth daily.      . rosuvastatin (CRESTOR) 20 MG tablet Take 1 tablet (20 mg total) by mouth daily.  90 tablet  3   No facility-administered medications prior to visit.    Allergies  Allergen Reactions  . Metformin And Related Other (See Comments)    Kidney function    Family History  Problem Relation Age of Onset  . Heart disease Mother   . Diabetes Mother   . Alzheimer's disease Father   . Cancer Sister     colon  . Diabetes Sister   .  Diabetes Daughter   . Diabetes Sister     History  Substance Use Topics  . Smoking status: Never Smoker   . Smokeless tobacco: Never Used  . Alcohol Use: No    ROS: As per history of present illness, otherwise negative  BP 130/76  Pulse 104  Ht 4' 11"  (1.499 m)  Wt 141 lb (63.957 kg)  BMI 28.46 kg/m2  LMP 07/06/1978 Constitutional: Well-developed and well-nourished. No distress. HEENT: Normocephalic and atraumatic. Oropharynx is clear and moist. No oropharyngeal exudate. Conjunctivae are normal.  No scleral icterus. Dentures top and bottom Neck: Neck supple. Trachea midline. Cardiovascular: Normal rate, regular rhythm and intact distal pulses. No M/R/G Pulmonary/chest: Effort normal and breath sounds normal. No wheezing, rales or rhonchi. Abdominal: Soft, nontender, nondistended. Bowel sounds active throughout. There are no masses palpable. Extremities: no clubbing, cyanosis, or edema Lymphadenopathy: No cervical adenopathy noted. Neurological: Alert and oriented to person place and time. Skin: Skin is warm and dry. No rashes noted. Psychiatric: Normal mood and affect. Behavior is normal.  RELEVANT LABS  CMP     Component Value Date/Time   NA 134* 03/28/2014 1453   K 4.3 03/28/2014 1453   CL 102 03/28/2014 1453   CO2 23 03/28/2014 1453   GLUCOSE 101* 03/28/2014 1453   BUN 22 03/28/2014 1453   CREATININE 1.9* 03/28/2014 1453   CREATININE 1.61* 12/11/2013 1043   CALCIUM 9.5 03/28/2014 1453   PROT 6.8 12/11/2013 1043   ALBUMIN 4.0 12/11/2013 1043   AST 24 12/11/2013 1043   ALT 25 12/11/2013 1043   ALKPHOS 88 12/11/2013 1043   BILITOT 0.3 12/11/2013 1043   GFRNONAA 32* 12/11/2013 1043   GFRNONAA 34* 09/01/2010 0845   GFRAA 36* 12/11/2013 1043   GFRAA  Value: 42        The eGFR has been calculated using the MDRD equation. This calculation has not been validated in all clinical situations. eGFR's persistently <60 mL/min signify possible Chronic Kidney Disease.* 09/01/2010 0845   Colonoscopy  performed by Jackquline Denmark on 03/25/2012 -- mild sigmoid diverticulosis, small internal hemorrhoids, otherwise normal colonoscopy. Repeat recommended in 3 years due to family history and quality of the prep.  ASSESSMENT/PLAN: 73 year old female with past medical history of diabetes, hypertension, hyperlipidemia, peripheral vascular disease with right lower extremity claudication and remote seizure disorder who is seen in consultation at the request of Debbrah Alar to evaluate early satiety and weight loss.  1. Weight loss/early satiety -- her symptoms are most consistent with gastroparesis though  we have not made this diagnosis objectively. She is not having pain. Due to her weight loss I recommended upper endoscopy to rule out any structural abnormality. I recommended a gastroparesis diet. If upper endoscopy is unremarkable, then formal gastric emptying study is recommended. Tighter glucose control will also help gastric emptying and she has a followup appointment with her endocrinologist later this morning.  2. GERD -- well-controlled omeprazole. We'll continue 20 mg daily, see #1  3. Constipation -- mild and responding to Colace. She will continue 200 mg daily  4. Family history of colon cancer -- repeat colonoscopy for screening recommended September 2016. Recall will be placed

## 2014-04-11 ENCOUNTER — Telehealth: Payer: Self-pay | Admitting: *Deleted

## 2014-04-11 MED ORDER — INSULIN LISPRO 100 UNIT/ML ~~LOC~~ SOLN: SUBCUTANEOUS | Status: DC

## 2014-04-11 MED ORDER — V-GO 20 KIT: PACK | Status: DC

## 2014-04-11 NOTE — Telephone Encounter (Signed)
Called pt and lvm advising her that she was approved to receive the V-GO 20. Dr Cruzita Lederer is sending an rx for the V-GO and her insulin in to [facility]. Advised pt to call and schedule an appt with Vaughan Basta, our diabetic educator.

## 2014-04-11 NOTE — Addendum Note (Signed)
Addended by: Philemon Kingdom on: 04/11/2014 09:46 AM   Modules accepted: Orders, Medications

## 2014-04-17 ENCOUNTER — Encounter: Payer: Medicare Other | Attending: Internal Medicine | Admitting: Nutrition

## 2014-04-17 ENCOUNTER — Other Ambulatory Visit: Payer: Self-pay

## 2014-04-17 ENCOUNTER — Other Ambulatory Visit: Payer: Self-pay | Admitting: Nephrology

## 2014-04-17 DIAGNOSIS — IMO0002 Reserved for concepts with insufficient information to code with codable children: Secondary | ICD-10-CM

## 2014-04-17 DIAGNOSIS — Z713 Dietary counseling and surveillance: Secondary | ICD-10-CM | POA: Diagnosis not present

## 2014-04-17 DIAGNOSIS — E1140 Type 2 diabetes mellitus with diabetic neuropathy, unspecified: Secondary | ICD-10-CM | POA: Insufficient documentation

## 2014-04-17 DIAGNOSIS — N183 Chronic kidney disease, stage 3 unspecified: Secondary | ICD-10-CM

## 2014-04-17 DIAGNOSIS — Z794 Long term (current) use of insulin: Secondary | ICD-10-CM | POA: Diagnosis not present

## 2014-04-17 DIAGNOSIS — E1165 Type 2 diabetes mellitus with hyperglycemia: Secondary | ICD-10-CM

## 2014-04-17 MED ORDER — GLUCOSE BLOOD VI STRP: ORAL_STRIP | Status: DC

## 2014-04-17 MED ORDER — V-GO 20 KIT: PACK | Status: DC

## 2014-04-17 NOTE — Patient Instructions (Signed)
Fill and apply a V-go daily Test blood sugars before meals and at bedtime Eat 2-3 servings of carbohydrates at each meal.

## 2014-04-17 NOTE — Progress Notes (Signed)
Pt. Was instructed on how to fill, apply and use the V-go 20.  She was given a starter kit with directions for the above, and she filled a V-go 20 with little assistance from me.  She took her Lantus last night at 10PM, so she will not apply this V-go until this evening at [facility].   She is aware that she will not take any more Lantus.    She was shown how to give the bolus amounts of insulin before meals, and she knows that each button press delivers 2u of Humalog insulin.  She will follow her usual dose of Humalog, and if she was giving an odd number, will reduce the V-go dose by 1u for today and tomorrow.    She was told to test her blood sugars before each meal, and at bedtime for now.    She is drinking juice at breakfast--grape juice or orange,and was told to stop this for now.  We discussed the idea of a balanced meal and the need to have consistant amounts of carbohydrates with each meal--something she is not doing now.  She was given a list of carb serving sizes and we reviewed this, and the need to have 2-3 servings at each meal.  She had no final  Questions.  She will call if questions.       I will call her tomorrow to see how her blood sugars are doing.

## 2014-04-18 ENCOUNTER — Other Ambulatory Visit: Payer: Self-pay | Admitting: *Deleted

## 2014-04-18 ENCOUNTER — Telehealth: Payer: Self-pay | Admitting: Nutrition

## 2014-04-18 MED ORDER — GLUCOSE BLOOD VI STRP: ORAL_STRIP | Status: DC

## 2014-04-18 NOTE — Telephone Encounter (Signed)
Pt. Reported no difficulty apply the V-go and using it before supper last night and this AM.  Blood sugar at 8PM was 233, and she took 4u.    2 hr. pcS it was 203, and at HS: 162.  FBS today was 75.  She was asymptomatic, and very happy.  Her 2hr. pcB was 195.  She was told to call blood sugars in one week.  She agreed to do this.

## 2014-04-24 ENCOUNTER — Encounter: Payer: Self-pay | Admitting: Internal Medicine

## 2014-04-24 ENCOUNTER — Telehealth: Payer: Self-pay | Admitting: Internal Medicine

## 2014-04-25 ENCOUNTER — Telehealth: Payer: Self-pay | Admitting: Family

## 2014-04-25 ENCOUNTER — Ambulatory Visit
Admission: RE | Admit: 2014-04-25 | Discharge: 2014-04-25 | Disposition: A | Discharge status: Home / Self Care | Payer: Medicare Other | Source: Ambulatory Visit | Attending: Nephrology | Admitting: Nephrology

## 2014-04-25 DIAGNOSIS — K859 Acute pancreatitis, unspecified: Secondary | ICD-10-CM

## 2014-04-25 DIAGNOSIS — D6489 Other specified anemias: Secondary | ICD-10-CM

## 2014-04-25 DIAGNOSIS — N183 Chronic kidney disease, stage 3 unspecified: Secondary | ICD-10-CM

## 2014-04-25 NOTE — Telephone Encounter (Signed)
Please contact pt and let her know that I received some lab work from [employer]. She is quite anemic. Pancreatic enzymes were also elevated.  I would like her to complete and IFOB kit (dx anemia) + additional lab work pended below.

## 2014-04-26 ENCOUNTER — Telehealth: Payer: Self-pay | Admitting: Internal Medicine

## 2014-04-26 ENCOUNTER — Other Ambulatory Visit: Payer: Self-pay | Admitting: *Deleted

## 2014-04-26 MED ORDER — ONETOUCH VERIO W/DEVICE KIT: 1.0000 | PACK | Freq: Every day | Status: DC

## 2014-04-26 MED ORDER — ONETOUCH VERIO W/DEVICE KIT: 1.0000 | PACK | Freq: Every day | Status: AC

## 2014-04-26 MED ORDER — GLUCOSE BLOOD VI STRP: ORAL_STRIP | Status: DC

## 2014-04-26 MED ORDER — ONETOUCH DELICA LANCETS 33G MISC: Status: AC

## 2014-04-26 MED ORDER — INSULIN LISPRO 100 UNIT/ML ~~LOC~~ SOLN: SUBCUTANEOUS | Status: DC

## 2014-04-26 NOTE — Telephone Encounter (Signed)
Pt called her humalog did not go through. Pt stated her ins co [employer] would not cover the meter and test strips ordered for her. Pt said they would cover One Touch Verio and strips. Ordered for pt.

## 2014-04-26 NOTE — Telephone Encounter (Signed)
Spoke and advised per recommendations. Scheduled for lab work next week. Orders entered and ifob placed in the mail.

## 2014-04-26 NOTE — Telephone Encounter (Signed)
V-Go start [facility] will cover meter and strips if it is ordered by [place], they want cover the meter she has right now, she asked for Vaughan Basta I explained that Vaughan Basta want be back until Monday so she asked if I could leave a message for you Karen Chafe in [place]

## 2014-04-30 ENCOUNTER — Encounter: Payer: Medicare Other | Admitting: Internal Medicine

## 2014-05-01 ENCOUNTER — Other Ambulatory Visit: Payer: Medicare Other

## 2014-05-03 ENCOUNTER — Telehealth: Payer: Self-pay | Admitting: Family

## 2014-05-03 NOTE — Telephone Encounter (Signed)
A user error has taken place.

## 2014-05-04 ENCOUNTER — Other Ambulatory Visit (INDEPENDENT_AMBULATORY_CARE_PROVIDER_SITE_OTHER): Payer: Medicare Other

## 2014-05-04 DIAGNOSIS — D6489 Other specified anemias: Secondary | ICD-10-CM | POA: Diagnosis not present

## 2014-05-04 DIAGNOSIS — Z79899 Other long term (current) drug therapy: Secondary | ICD-10-CM | POA: Diagnosis not present

## 2014-05-04 DIAGNOSIS — D509 Iron deficiency anemia, unspecified: Secondary | ICD-10-CM

## 2014-05-04 DIAGNOSIS — K859 Acute pancreatitis, unspecified: Secondary | ICD-10-CM | POA: Diagnosis not present

## 2014-05-04 LAB — HEPATIC FUNCTION PANEL
ALT: 12 U/L (ref 0–35)
AST: 21 U/L (ref 0–37)
Albumin: 3.4 g/dL — ABNORMAL LOW (ref 3.5–5.2)
Alkaline Phosphatase: 54 U/L (ref 39–117)
BILIRUBIN TOTAL: 0.6 mg/dL (ref 0.2–1.2)
Bilirubin, Direct: 0 mg/dL (ref 0.0–0.3)
Total Protein: 7.2 g/dL (ref 6.0–8.3)

## 2014-05-04 LAB — LIPASE: Lipase: 32 U/L (ref 11.0–59.0)

## 2014-05-04 LAB — IRON: IRON: 25 ug/dL — AB (ref 42–145)

## 2014-05-04 LAB — AMYLASE: Amylase: 60 U/L (ref 27–131)

## 2014-05-04 LAB — FOLATE: Folate: 13.1 ng/mL (ref >5.9)

## 2014-05-04 LAB — VITAMIN B12: VITAMIN B 12: 289 pg/mL (ref 211–911)

## 2014-05-05 ENCOUNTER — Telehealth: Payer: Self-pay | Admitting: Family

## 2014-05-05 DIAGNOSIS — D509 Iron deficiency anemia, unspecified: Secondary | ICD-10-CM

## 2014-05-05 NOTE — Telephone Encounter (Signed)
Iron level is low, add iron supplement 325 mg twice daily, complete ifob, dx anemia.  Repeat CBC in 6 weeks. Pancreatic tests have returned to normal.

## 2014-05-07 ENCOUNTER — Telehealth: Payer: Self-pay | Admitting: Family

## 2014-05-07 NOTE — Telephone Encounter (Signed)
Left message to return my call.  

## 2014-05-07 NOTE — Telephone Encounter (Signed)
PATIENT IS RETURNING YOUR CALL °

## 2014-05-07 NOTE — Telephone Encounter (Signed)
See previous phone note.  

## 2014-05-07 NOTE — Telephone Encounter (Signed)
Notified pt of below recommendations and she voices understanding. Pt was previously given IFOB and states she will have it in the mail today. Scheduled lab appt for CBC on 06/19/14 at 10am. Future order entered.

## 2014-05-10 ENCOUNTER — Encounter: Payer: Self-pay | Admitting: Internal Medicine

## 2014-05-10 ENCOUNTER — Ambulatory Visit (INDEPENDENT_AMBULATORY_CARE_PROVIDER_SITE_OTHER): Payer: Medicare Other | Admitting: Internal Medicine

## 2014-05-10 VITALS — BP 126/62 | HR 88 | Temp 97.6°F | Resp 12 | Wt 137.0 lb

## 2014-05-10 DIAGNOSIS — E1165 Type 2 diabetes mellitus with hyperglycemia: Principal | ICD-10-CM

## 2014-05-10 DIAGNOSIS — E1149 Type 2 diabetes mellitus with other diabetic neurological complication: Secondary | ICD-10-CM

## 2014-05-10 DIAGNOSIS — IMO0002 Reserved for concepts with insufficient information to code with codable children: Secondary | ICD-10-CM

## 2014-05-10 DIAGNOSIS — E1141 Type 2 diabetes mellitus with diabetic mononeuropathy: Secondary | ICD-10-CM

## 2014-05-10 NOTE — Patient Instructions (Signed)
Patient Instructions  Please continue VGo 20. - Use 3 clicks for a smaller meal and 4 clicks for a larger meal In 3-4 days, if the sugars are still >200: - add Glipizide 5 mg 2x a day before meals. Please return in 1 month with your sugar log.  Please let me know if the sugars are consistently <80 or >200 in 2 weeks.

## 2014-05-10 NOTE — Progress Notes (Signed)
Patient ID: Michaela Shaw, female   DOB: 03-19-1941, 73 y.o.   MRN: [id number]  HPI: Michaela Shaw is a 73 y.o.-year-old female, returning for f/u for DM2, dx 2005, insulin-dependent since 2007, uncontrolled, with complications (cerebrovascular disease - h/o stroke in 2009, CKD, DR, PN). Last visit 1 mo ago.  She has a URI.  Last hemoglobin A1c was: Lab Results  Component Value Date   HGBA1C 8.0* 03/14/2014   HGBA1C 8.5* 12/11/2013   HGBA1C 8.8* 09/06/2013   Pt was on a regimen of: - Januvia 100 mg in am >> Tradjenta 5 mg daily - Glipizide 5 mg bid - Lantus 72 >> 63 >> 40 >> 35 units qhs  - Humalog SSI tid ac: target 150, ISF 25 (2 units for every 50).  She is on a DM study med - once a week.  At last visit, we started VGo20 mechanical pump. Uses 2 clicks per meal. She continues Tradjenta 5 mg in am.  Pt checks her sugars 1-3x a day and they are MUCH higher: - am: 27, 35, 39 - 257 >> 71, 76-91, 150 this am >> 89-298 - 2h after b'fast: n/c >> 44, 196-HI - before lunch: 126 >> 160-230 >> 297-411 - 2h after lunch: n/c >> 229-429 - before dinner: n/c >> 160-230 >> 84, 121-485 - 2h after dinner: n/c >> 119-450 - bedtime: n/c Has lows. Lowest sugar was 44!; No hypoglycemia awareness! Highest sugar was HI.  Pt's meals are: - Breakfast:1/2 bagel - Lunch: 1/2 bagel - Dinner: chicken + veggies - Snacks: 1  - Has CKD, last BUN/creatinine:  Lab Results  Component Value Date   BUN 22 03/28/2014   CREATININE 1.9* 03/28/2014   - last set of lipids: Lab Results  Component Value Date   CHOL 153 03/14/2014   HDL 33.50* 03/14/2014   LDLCALC NOT CALC 12/11/2013   LDLDIRECT 84.8 03/14/2014   TRIG 259.0* 03/14/2014   CHOLHDL 5 03/14/2014  She is on Crestor, Tricor. - She has + DR. Last eye exam: 05/2014. - + numbness and tingling in her feet.  I reviewed pt's medications, allergies, PMH, social hx, family hx and no changes required, except as mentioned above + started Norvasc,  stopped Valsartan.  ROS: Constitutional: no weight loss, no fatigue, + subjective hypothermia Eyes: no blurry vision, no xerophthalmia ENT: no sore throat, no nodules palpated in throat, no dysphagia/odynophagia, + hoarseness Cardiovascular: no CP/SOB/palpitations/leg swelling Respiratory: no cough/SOB Gastrointestinal: no N/V/D/+ C Musculoskeletal: no muscle/joint aches Skin: no rashes Neurological: no tremors/numbness/tingling/dizziness  PE: BP 126/62 mmHg  Pulse 88  Temp(Src) 97.6 F (36.4 C) (Oral)  Resp 12  Wt 137 lb (62.143 kg)  SpO2 97%  LMP 07/06/1978 Body mass index is 27.66 kg/(m^2).   Wt Readings from Last 3 Encounters:  05/10/14 137 lb (62.143 kg)  04/09/14 141 lb (63.957 kg)  04/09/14 141 lb (63.957 kg)   Constitutional: overweight, in NAD Eyes: PERRLA, EOMI, no exophthalmos ENT: moist mucous membranes, no thyromegaly, no cervical lymphadenopathy Cardiovascular: RRR, No MRG Respiratory: CTA B Gastrointestinal: abdomen soft, NT, ND, BS+ Musculoskeletal: no deformities, strength intact in all 4 Skin: moist, warm, no rashes Neurological: no tremor with outstretched hands, DTR normal in all 4  ASSESSMENT: 1. DM2, insulin-dependent, uncontrolled, with complications - cerebrovascular disease - h/o stroke in 2009 - CKD - DR - PN  PLAN:  1. Patient with long-standing, uncontrolled diabetes, on VGo 20, with much increased sugars after the switch to the VGo and  having a URI >> using cough syrup. - I advised her to:  Patient Instructions  Please continue VGo 20. - Use 3 clicks for a smaller meal and 4 clicks for a larger meal In 3-4 days, if the sugars are still >200: - add Glipizide 5 mg 2x a day before meals. Please return in 1 month with your sugar log.  Please let me know if the sugars are consistently <80 or >200 in 2 weeks. - continue checking sugars at different times of the day - check 3 times a day, rotating checks - advised for yearly eye exams >>  she is UTD - had the flu vaccine this season - Return to clinic in 1 mo with sugar log

## 2014-05-18 ENCOUNTER — Encounter: Payer: Self-pay | Admitting: Surgery

## 2014-05-21 ENCOUNTER — Telehealth: Payer: Self-pay | Admitting: Family

## 2014-05-21 ENCOUNTER — Ambulatory Visit (INDEPENDENT_AMBULATORY_CARE_PROVIDER_SITE_OTHER): Payer: Medicare Other | Admitting: Surgery

## 2014-05-21 ENCOUNTER — Encounter: Payer: Self-pay | Admitting: Surgery

## 2014-05-21 ENCOUNTER — Other Ambulatory Visit: Payer: Self-pay | Admitting: Internal Medicine

## 2014-05-21 VITALS — BP 147/65 | HR 95 | Ht 59.0 in | Wt 136.4 lb

## 2014-05-21 DIAGNOSIS — I70219 Atherosclerosis of native arteries of extremities with intermittent claudication, unspecified extremity: Secondary | ICD-10-CM

## 2014-05-21 DIAGNOSIS — I739 Peripheral vascular disease, unspecified: Secondary | ICD-10-CM

## 2014-05-21 NOTE — Telephone Encounter (Signed)
Opened in error

## 2014-05-21 NOTE — Progress Notes (Signed)
Patient name: Michaela Shaw MRN: [id number] DOB: 31-Oct-1940 Sex: female     Chief Complaint  Patient presents with  . Re-evaluation    3 month f/u - states LE pain in better since last visit    HISTORY OF PRESENT ILLNESS: The patient is back today for follow-up of her right leg claudication.  She states that she had significant improvement with her trial of cilostazol.  She no longer gets leg pain with walking.  She is concerned about swelling in her left leg.  It has been swelling since a foot injury a year ago.  She continues to be without ulceration bilaterally  Past Medical History  Diagnosis Date  . History of chicken pox   . Diabetes mellitus without complication   . Hyperlipidemia   . Hypertension   . Stroke 2011    "mild" per pt.   . Seizures 2005  . Type 2 diabetes mellitus with neurological manifestations, uncontrolled 03/05/2013    Past Surgical History  Procedure Laterality Date  . Bladder suspension  1980  . Abdominal hysterectomy  1980    partial  . Cholecystectomy  2007  . Appendectomy  2007  . Spine surgery  2012    Has rods and pins    History   Social History  . Marital Status: Legally Separated    Spouse Name: N/A    Number of Children: N/A  . Years of Education: N/A   Occupational History  . Not on file.   Social History Main Topics  . Smoking status: Never Smoker   . Smokeless tobacco: Never Used  . Alcohol Use: No  . Drug Use: No  . Sexual Activity: Not on file   Other Topics Concern  . 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 Heart disease Mother   . Diabetes Mother   . Alzheimer's disease Father   . Cancer Sister     colon  . Diabetes Sister   . Diabetes Daughter   . Diabetes Sister     Allergies  as of 05/21/2014 - Review Complete 05/21/2014  Allergen Reaction Noted  . Metformin and related Other (See Comments) 03/28/2014    Current Outpatient Prescriptions on File Prior to Visit  Medication Sig Dispense Refill  . alendronate (FOSAMAX) 70 MG tablet Take 1 tablet (70 mg total) by mouth every 7 (seven) days. Take with a full glass of water on an empty stomach. 4 tablet 11  . amLODipine (NORVASC) 5 MG tablet Take 1 tablet (5 mg total) by mouth daily. 30 tablet 2  . aspirin 81 MG tablet Take 81 mg by mouth daily.    . Blood Glucose Monitoring Suppl (ONETOUCH VERIO) W/DEVICE KIT 1 each by Does not apply route daily. Please use to test blood sugar 4 times as instructed. Dx code: E11.40 1 kit 0  . Calcium Carbonate-Vitamin D (CALCIUM 600 + D PO) Take 1 tablet by mouth every morning.    . cilostazol (PLETAL) 100 MG tablet Take 1 tablet (100 mg total) by mouth 2 (two) times daily before a meal. 60 tablet 11  . cloNIDine (CATAPRES) 0.2 MG tablet TAKE  ONE TABLET BY MOUTH TWICE DAILY 60 tablet 3  . co-enzyme Q-10 30 MG capsule Take 100 mg by mouth daily.    . fenofibrate (TRICOR) 145 MG tablet TAKE ONE TABLET BY MOUTH ONCE DAILY 30 tablet 3  . glucose blood (ONETOUCH VERIO) test strip Use to test blood sugar 4 times daily as instructed. Dx code: E11.40 200 each 5  . Insulin Disposable Pump (V-GO 20) KIT Use one a day 90 kit 3  . insulin lispro (HUMALOG) 100 UNIT/ML injection To use in VGo 20 (up to 60 units daily) 60 mL 3  . Insulin Syringe-Needle U-100 (RELION INSULIN SYR 0.3CC/30G) 30G X 5/16" 0.3 ML MISC Inject 1 each into the skin 2 (two) times daily.    Marland Kitchen omeprazole (PRILOSEC) 20 MG capsule Take 1 capsule (20 mg total) by mouth every morning. 30 capsule 5  . ONETOUCH DELICA LANCETS 61Y MISC Use to test blood sugar 4 times daily as instructed. 200 each 5  . potassium gluconate 595 MG TABS tablet Take 595 mg by mouth daily.    . rosuvastatin (CRESTOR) 20 MG tablet Take 1 tablet (20 mg total) by  mouth daily. 90 tablet 3   No current facility-administered medications on file prior to visit.     REVIEW OF SYSTEMS: Please see history of present illness, otherwise no changes  PHYSICAL EXAMINATION:   Vital signs are BP 147/65 mmHg  Pulse 95  Ht 4' 11"  (1.499 m)  Wt 136 lb 6.4 oz (61.871 kg)  BMI 27.53 kg/m2  SpO2 97%  LMP 07/06/1978 General: The patient appears their stated age. HEENT:  No gross abnormalities Pulmonary:  Non labored breathing Musculoskeletal: There are no major deformities. Neurologic: No focal weakness or paresthesias are detected, Skin: There are no ulcer or rashes noted. Psychiatric: The patient has normal affect. Cardiovascular: pedal pulses not palpable.  Positive left leg edema to the knee   Diagnostic Studies none  Assessment: #1: Peripheral vascular disease  Plan: The patient had a good response to cilostazol.  We will continue with this.  I am also giving her a prescription for compression stockings to help the swelling in her left leg.  She is scheduled to follow-up in August of next year with a repeat vascular study of her lower extremities as well as her carotid disease, given her history of stroke  V. Leia Alf, M.D. [facility] Office: [phone] Pager:  [phone]  2

## 2014-05-21 NOTE — Patient Instructions (Signed)
Intermittent Claudication Blockage of leg arteries results from poor circulation of blood in the leg arteries. This produces an aching, tired, and sometimes burning pain in the legs that is brought on by exercise and made better by rest. Claudication refers to the limping that happens from leg cramps. It is also referred to as Vaso-occlusive disease of the legs, arterial insufficiency of the legs, recurrent leg pain, recurrent leg cramping and calf pain with exercise.  CAUSES  This condition is due to narrowing or blockage of the arteries (muscular vessels which carry blood away from the heart and around the body). Blockage of arteries can occur anywhere in the body. If they occur in the heart, a person may experience angina (chest pain) or even a heart attack. If they occur in the neck or the brain, a person may have a stroke. Intermittent claudication is when the blockage occurs in the legs, most commonly in the calf or the foot.  Atherosclerosis, or blockage of arteries, can occur for many reasons. Some of these are smoking, diabetes, and high cholesterol. SYMPTOMS  Intermittent claudication may occur in both legs, and it often continues to get worse over time. However, some people complain only of weakness in the legs when walking, or a feeling of "tiredness" in the buttocks. Impotence (not able to have an erection) is an occasional complaint in men. Pain while resting is uncommon.  WHAT TO EXPECT AT YOUR HEALTH CARE PROVIDER'S OFFICE: Your medical history will be asked for and a physical examination will be performed. Medical history questions documenting claudication in detail may include:   Time pattern  Do you have leg cramps at night (nocturnal cramps)?  How often does leg pain with cramping occur?  Is it getting worse?  What is the quality of the pain?  Is the pain sharp?  Is there an aching pain with the cramps?  Aggravating factors  Is it worse after you exercise?  Is it  worse after you are standing for a while?  Do you smoke? How much?  Do you drink alcohol? How much?  Are you diabetic? How well is your blood sugar controlled?  Other  What other symptoms are also present?  Has there been impotence (men)?  Is there pain in the back?  Is there a darkening of the skin of the legs, feet or toes?  Is there weakness or paralysis of the legs? The physical examination may include evaluation of the femoral pulse (in the groin) and the other areas where the pulse can be felt in the legs. DIAGNOSIS  Diagnostic tests that may be performed include:  Blood pressure measured in arms and legs for comparison.  Doppler ultrasonography on the legs and the heart.  Duplex Doppler/ultrasound exam of extremity to visualize arterial blood flow.  ECG- to evaluate the activity of your heart.  Aortography- to visualize blockages in your arteries. TREATMENT Surgical treatment may be suggested if claudication interferes with the patient's activities or work, and if the diseased arteries do not seem to be improving after treatment. Be aware that this condition can worsen over time and you should carefully monitor your condition. HOME CARE INSTRUCTIONS  Talk to your caregiver about the cause of your leg cramping and about what to do at home to relieve it.  A healthy diet is important to lessen the likeliness of atherosclerosis.  A program of daily walking for short periods, and stopping for pain or cramping, may help improve function.  It is important to   stop smoking.  Avoid putting hot or cold items on legs.  Avoid tight shoes. SEEK MEDICAL CARE IF: There are many other causes of leg pain such as arthritis or low blood potassium. However, some causes of leg pain may be life threatening such as a blood clot in the legs. Seek medical attention if you have:  Leg pain that does not go away.  Legs that may be red, hot or swollen.  Ulcers or sores appear on your  ankle or foot.  Any chest pain or shortness of breath accompanying leg pain.  Diabetes.  You are pregnant. SEEK IMMEDIATE MEDICAL CARE IF:   Your leg pain becomes severe or will not go away.  Your foot turns blue or a dark color.  Your leg becomes red, hot or swollen or you develop a fever over 102F.  Any chest pain or shortness of breath accompanying leg pain. MAKE SURE YOU:   Understand these instructions.  Will watch your condition.  Will get help right away if you are not doing well or get worse. Document Released: 04/24/2004 Document Revised: 09/14/2011 Document Reviewed: 09/28/2013 ExitCare Patient Information 2015 [employer]. This information is not intended to replace advice given to you by your health care provider. Make sure you discuss any questions you have with your health care provider.  

## 2014-05-29 LAB — HM DIABETES EYE EXAM

## 2014-06-04 ENCOUNTER — Encounter: Payer: Self-pay | Admitting: Internal Medicine

## 2014-06-04 ENCOUNTER — Ambulatory Visit (AMBULATORY_SURGERY_CENTER): Payer: Medicare Other | Admitting: Internal Medicine

## 2014-06-04 VITALS — BP 122/62 | HR 72 | Temp 97.9°F | Resp 21 | Ht 59.0 in | Wt 141.0 lb

## 2014-06-04 DIAGNOSIS — R6881 Early satiety: Secondary | ICD-10-CM

## 2014-06-04 DIAGNOSIS — R634 Abnormal weight loss: Secondary | ICD-10-CM

## 2014-06-04 LAB — GLUCOSE, CAPILLARY
GLUCOSE-CAPILLARY: 137 mg/dL — AB (ref 70–99)
Glucose-Capillary: 67 mg/dL — ABNORMAL LOW (ref 70–99)

## 2014-06-04 MED ORDER — SODIUM CHLORIDE 0.9 % IV SOLN: 500.0000 mL | INTRAVENOUS | Status: DC

## 2014-06-04 MED ORDER — DEXTROSE 5 % IV SOLN: INTRAVENOUS | Status: DC

## 2014-06-04 NOTE — Progress Notes (Signed)
Called to room to assist during endoscopic procedure.  Patient ID and intended procedure confirmed with present staff. Received instructions for my participation in the procedure from the performing physician.  

## 2014-06-04 NOTE — Progress Notes (Signed)
Report to PACU, RN, vss, BBS= Clear.  

## 2014-06-04 NOTE — Op Note (Signed)
[facility]  [address] [place], [id number]   ENDOSCOPY PROCEDURE REPORT  PATIENT: Michaela Shaw, Michaela Shaw  MR#: [id number] BIRTHDATE: 05-Jan-1941 , 72  yrs. old GENDER: female ENDOSCOPIST: Jerene Bears, MD REFERRED BY:  Debbrah Alar, FNP PROCEDURE DATE:  06/04/2014 PROCEDURE:  EGD w/ biopsy for H.pylori ASA CLASS:     Class III INDICATIONS:  weight loss.  early satiety MEDICATIONS: Monitored anesthesia care and Propofol 120 mg IV TOPICAL ANESTHETIC: none  DESCRIPTION OF PROCEDURE: After the risks benefits and alternatives of the procedure were thoroughly explained, informed consent was obtained.  The LB [id number] endoscope was introduced through the mouth and advanced to the second portion of the duodenum , Without limitations.  The instrument was slowly withdrawn as the mucosa was fully examined.   ESOPHAGUS: The mucosa of the esophagus appeared normal.  STOMACH: A 2 cm hiatal hernia was noted.   Mild striped gastritis (inflammation) was found in the gastric antrum.  Cold forcep biopsies were taken at the gastric body, antrum and angularis to evaluate for h.  pylori.   Otherwise normal stomach  DUODENUM: The duodenal mucosa showed no abnormalities in the bulb and 2nd part of the duodenum.  Retroflexed views revealed a hiatal hernia.     The scope was then withdrawn from the patient and the procedure completed.  COMPLICATIONS: There were no immediate complications.  ENDOSCOPIC IMPRESSION: 1.   The mucosa of the esophagus appeared normal 2.   2 cm hiatal hernia 3.   Mild gastritis (inflammation) was found in the gastric antrum; multiple biopsies 4.   The duodenal mucosa showed no abnormalities in the bulb and 2nd part of the duodenum  RECOMMENDATIONS: 1.  Await biopsy results 2.  Follow-up of helicobacter pylori status, treat if indicated 3.  If persistent symptoms, proceed to gastric emptying study  eSigned:  Jerene Bears, MD 2014-06-04  14:15:[id number]   CC:O'[place], [place] FNP and The Patient

## 2014-06-04 NOTE — Patient Instructions (Signed)
YOU HAD AN ENDOSCOPIC PROCEDURE TODAY AT THE [facility]: Refer to the procedure report that was given to you for any specific questions about what was found during the examination.  If the procedure report does not answer your questions, please call your gastroenterologist to clarify.  If you requested that your care partner not be given the details of your procedure findings, then the procedure report has been included in a sealed envelope for you to review at your convenience later.  YOU SHOULD EXPECT: Some feelings of bloating in the abdomen. Passage of more gas than usual.  Walking can help get rid of the air that was put into your GI tract during the procedure and reduce the bloating. If you had a lower endoscopy (such as a colonoscopy or flexible sigmoidoscopy) you may notice spotting of blood in your stool or on the toilet paper. If you underwent a bowel prep for your procedure, then you may not have a normal bowel movement for a few days.  DIET: Your first meal following the procedure should be a light meal and then it is ok to progress to your normal diet.  A half-sandwich or bowl of soup is an example of a good first meal.  Heavy or fried foods are harder to digest and may make you feel nauseous or bloated.  Likewise meals heavy in dairy and vegetables can cause extra gas to form and this can also increase the bloating.  Drink plenty of fluids but you should avoid alcoholic beverages for 24 hours.  ACTIVITY: Your care partner should take you home directly after the procedure.  You should plan to take it easy, moving slowly for the rest of the day.  You can resume normal activity the day after the procedure however you should NOT DRIVE or use heavy machinery for 24 hours (because of the sedation medicines used during the test).    SYMPTOMS TO REPORT IMMEDIATELY: A gastroenterologist can be reached at any hour.  During normal business hours, 8:30 AM to 5:00 PM Monday through Friday,  call [phone].  After hours and on weekends, please call the GI answering service at [phone] who will take a message and have the physician on call contact you.     Following upper endoscopy (EGD)  Vomiting of blood or coffee ground material  New chest pain or pain under the shoulder blades  Painful or persistently difficult swallowing  New shortness of breath  Fever of 100F or higher  Black, tarry-looking stools  FOLLOW UP: If any biopsies were taken you will be contacted by phone or by letter within the next 1-3 weeks.  Call your gastroenterologist if you have not heard about the biopsies in 3 weeks.  Our staff will call the home number listed on your records the next business day following your procedure to check on you and address any questions or concerns that you may have at that time regarding the information given to you following your procedure. This is a courtesy call and so if there is no answer at the home number and we have not heard from you through the emergency physician on call, we will assume that you have returned to your regular daily activities without incident.  SIGNATURES/CONFIDENTIALITY: You and/or your care partner have signed paperwork which will be entered into your electronic medical record.  These signatures attest to the fact that that the information above on your After Visit Summary has been reviewed and is understood.    Full responsibility of the confidentiality of this discharge information lies with you and/or your care-partner.  Hiatal hernia and gastritis information given.

## 2014-06-05 ENCOUNTER — Telehealth: Payer: Self-pay | Admitting: *Deleted

## 2014-06-05 NOTE — Telephone Encounter (Signed)
No answer, left message to call if questions or concerns. 

## 2014-06-06 ENCOUNTER — Other Ambulatory Visit: Payer: Self-pay | Admitting: Family

## 2014-06-08 ENCOUNTER — Ambulatory Visit (INDEPENDENT_AMBULATORY_CARE_PROVIDER_SITE_OTHER): Payer: Medicare Other | Admitting: Family

## 2014-06-08 ENCOUNTER — Encounter: Payer: Self-pay | Admitting: Family

## 2014-06-08 VITALS — BP 138/56 | HR 80 | Temp 98.0°F | Resp 16 | Ht 59.0 in | Wt 134.0 lb

## 2014-06-08 DIAGNOSIS — M7989 Other specified soft tissue disorders: Secondary | ICD-10-CM

## 2014-06-08 DIAGNOSIS — N189 Chronic kidney disease, unspecified: Secondary | ICD-10-CM

## 2014-06-08 DIAGNOSIS — I10 Essential (primary) hypertension: Secondary | ICD-10-CM

## 2014-06-08 DIAGNOSIS — E1141 Type 2 diabetes mellitus with diabetic mononeuropathy: Secondary | ICD-10-CM

## 2014-06-08 NOTE — Assessment & Plan Note (Signed)
Fair control, management per Endo.

## 2014-06-08 NOTE — Progress Notes (Signed)
Subjective:    Patient ID: Michaela Shaw, female    DOB: 1940-11-25, 73 y.o.   MRN: [id number]  HPI  Michaela Shaw is a 73 yr old female who presents today for follow up of multiple medical issues:  1) DM2-  She is followed by endo (Dr. Cruzita Lederer). She was placed back on glpizide and is also on Vgo.  Her last A1C was 8.0.   Lab Results  Component Value Date   HGBA1C 8.0* 03/14/2014   HGBA1C 8.5* 12/11/2013   HGBA1C 8.8* 09/06/2013   Lab Results  Component Value Date   MICROALBUR 18.55* 03/03/2013   LDLCALC NOT CALC 12/11/2013   CREATININE 1.9* 03/28/2014     2) HTN- currently maintained on amlodipine.  This was increased from 52m to 198mper Dr. PoFlorene Glen 3) Renal insufficiency- followed by Nephrology (Dr. PoFlorene Glen   Lab Results  Component Value Date   CREATININE 1.9* 03/28/2014   4) Left foot/leg swelling- reports that this has been chronic x 1 year.   Review of Systems See HPI  Past Medical History  Diagnosis Date  . History of chicken pox   . Diabetes mellitus without complication   . Hyperlipidemia   . Hypertension   . Stroke 2011    "mild" per pt.   . Seizures 2005  . Type 2 diabetes mellitus with neurological manifestations, uncontrolled 03/05/2013    History   Social History  . Marital Status: Legally Separated    Spouse Name: N/A    Number of Children: N/A  . Years of Education: N/A   Occupational History  . Not on file.   Social History Main Topics  . Smoking status: Never Smoker   . Smokeless tobacco: Never Used  . Alcohol Use: No  . Drug Use: No  . Sexual Activity: Not on file   Other Topics Concern  . 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 Bladder suspension  1980  . Abdominal hysterectomy  1980    partial  .  Cholecystectomy  2007  . Appendectomy  2007  . Spine surgery  2012    Has rods and pins    Family History  Problem Relation Age of Onset  . Heart disease Mother   . Diabetes Mother   . Alzheimer's disease Father   . Cancer Sister     colon  . Diabetes Sister   . Diabetes Daughter   . Diabetes Sister     Allergies  Allergen Reactions  . Metformin And Related Other (See Comments)    Kidney function    Current Outpatient Prescriptions on File Prior to Visit  Medication Sig Dispense Refill  . alendronate (FOSAMAX) 70 MG tablet Take 1 tablet (70 mg total) by mouth every 7 (seven) days. Take with a full glass of water on an empty stomach. 4 tablet 11  . amLODipine (NORVASC) 5 MG tablet Take 1 tablet (5 mg total) by mouth daily. 30 tablet 2  . aspirin 81 MG tablet Take 81 mg by mouth daily.    . Blood Glucose Monitoring Suppl (ONETOUCH VERIO) W/DEVICE KIT 1 each by Does not apply route daily. Please use  to test blood sugar 4 times as instructed. Dx code: E11.40 1 kit 0  . Calcium Carbonate-Vitamin D (CALCIUM 600 + D PO) Take 1 tablet by mouth every morning.    . cilostazol (PLETAL) 100 MG tablet Take 1 tablet (100 mg total) by mouth 2 (two) times daily before a meal. 60 tablet 11  . cloNIDine (CATAPRES) 0.2 MG tablet TAKE ONE TABLET BY MOUTH TWICE DAILY 60 tablet 3  . co-enzyme Q-10 30 MG capsule Take 100 mg by mouth daily.    . fenofibrate (TRICOR) 145 MG tablet TAKE ONE TABLET BY MOUTH ONCE DAILY 30 tablet 3  . ferrous sulfate 325 (65 FE) MG tablet Take 325 mg by mouth 2 (two) times daily with a meal.    . glipiZIDE (GLUCOTROL) 5 MG tablet TAKE ONE TABLET BY MOUTH TWICE DAILY BEFORE MEAL(S) 60 tablet 2  . glucose blood (ONETOUCH VERIO) test strip Use to test blood sugar 4 times daily as instructed. Dx code: E11.40 200 each 5  . Insulin Disposable Pump (V-GO 20) KIT Use one a day 90 kit 3  . insulin lispro (HUMALOG) 100 UNIT/ML injection To use in VGo 20 (up to 60 units daily) 60 mL  3  . Insulin Syringe-Needle U-100 (RELION INSULIN SYR 0.3CC/30G) 30G X 5/16" 0.3 ML MISC Inject 1 each into the skin 2 (two) times daily.    Marland Kitchen omeprazole (PRILOSEC) 20 MG capsule TAKE ONE CAPSULE BY MOUTH IN THE MORNING 30 capsule 3  . ONETOUCH DELICA LANCETS 01S MISC Use to test blood sugar 4 times daily as instructed. 200 each 5  . potassium gluconate 595 MG TABS tablet Take 595 mg by mouth daily.    . rosuvastatin (CRESTOR) 20 MG tablet Take 1 tablet (20 mg total) by mouth daily. 90 tablet 3   No current facility-administered medications on file prior to visit.    BP 138/56 mmHg  Pulse 80  Temp(Src) 98 F (36.7 C) (Oral)  Resp 16  Ht _0  (1.499 m)  Wt 134 lb (60.782 kg)  BMI 27.05 kg/m2  SpO2 98%  LMP 07/06/1978       Objective:   Physical Exam  Constitutional: She is oriented to person, place, and time. She appears well-developed and well-nourished. No distress.  HENT:  Head: Normocephalic and atraumatic.  Cardiovascular: Normal rate and regular rhythm.   No murmur heard. Pulmonary/Chest: Effort normal and breath sounds normal. No respiratory distress. She has no wheezes. She has no rales. She exhibits no tenderness.  Musculoskeletal:  Left foot is swollen without erythema, mild left leg swelling without calf tenderness.   Neurological: She is alert and oriented to person, place, and time.  Skin: Skin is warm and dry.  Psychiatric: She has a normal mood and affect. Her behavior is normal. Judgment and thought content normal.          Assessment & Plan:

## 2014-06-08 NOTE — Assessment & Plan Note (Signed)
Repeat bmet- also need to evaluate sodium as last sodium was low.

## 2014-06-08 NOTE — Assessment & Plan Note (Signed)
Obtain doppler to rule out DVT.

## 2014-06-08 NOTE — Assessment & Plan Note (Signed)
BP stable, continue current meds. 

## 2014-06-11 ENCOUNTER — Encounter: Payer: Self-pay | Admitting: Internal Medicine

## 2014-06-11 ENCOUNTER — Ambulatory Visit (INDEPENDENT_AMBULATORY_CARE_PROVIDER_SITE_OTHER): Payer: Medicare Other | Admitting: Internal Medicine

## 2014-06-11 VITALS — BP 138/66 | HR 117 | Temp 98.7°F | Resp 12 | Wt 135.8 lb

## 2014-06-11 DIAGNOSIS — E1165 Type 2 diabetes mellitus with hyperglycemia: Principal | ICD-10-CM

## 2014-06-11 DIAGNOSIS — IMO0002 Reserved for concepts with insufficient information to code with codable children: Secondary | ICD-10-CM

## 2014-06-11 DIAGNOSIS — E1141 Type 2 diabetes mellitus with diabetic mononeuropathy: Secondary | ICD-10-CM

## 2014-06-11 DIAGNOSIS — E1149 Type 2 diabetes mellitus with other diabetic neurological complication: Secondary | ICD-10-CM

## 2014-06-11 LAB — HEMOGLOBIN A1C: HEMOGLOBIN A1C: 8.7 % — AB (ref 4.6–6.5)

## 2014-06-11 NOTE — Progress Notes (Signed)
Patient ID: Michaela Shaw, female   DOB: [birth year], 73 y.o.   MRN: [id number]  HPI: Michaela Shaw is a 73 y.o.-year-old female, returning for f/u for DM2, dx 2005, insulin-dependent since 2007, uncontrolled, with complications (cerebrovascular disease - h/o stroke in 2009, CKD, DR, PN). Last visit 1 mo ago.  She has a URI.  Last hemoglobin A1c was: Lab Results  Component Value Date   HGBA1C 8.0* 03/14/2014   HGBA1C 8.5* 12/11/2013   HGBA1C 8.8* 09/06/2013   Pt was on a regimen of: - Januvia 100 mg in am >> Tradjenta 5 mg daily - Glipizide 5 mg bid - Lantus 72 >> 63 >> 40 >> 35 units qhs  - Humalog SSI tid ac: target 150, ISF 25 (2 units for every 50).  She is on a DM study med - once a week.  At last visit, we started VGo20 mechanical pump. Uses 3 clicks per smaller meal. Uses 4 clicks for a larger meal. She continues Tradjenta 5 mg in am. We started Glipizide 5 mg bid 04/2014.  Pt checks her sugars 1-3x a day and they are still high, a little better: - am: 27, 35, 39 - 257 >> 71, 76-91, 150 this am >> 89-298 >> 82-205, 216, 276 - 2h after b'fast: n/c >> 44, 196-HI >> 138-347 - before lunch: 126 >> 160-230 >> 297-411 >> n/c - 2h after lunch: n/c >> 229-429 >> 117, 164-257, 350 - before dinner: n/c >> 160-230 >> 84, 121-485 >> 83-160, 334 - 2h after dinner: n/c >> 119-450 >> 111-210, 363 - bedtime: n/c >> 174-311 Has lows. Lowest sugar was 82 since last visit; No hypoglycemia awareness! Highest sugar was 400s  Pt's meals are: - Breakfast:1/2 bagel - Lunch: 1/2 bagel - Dinner: chicken + veggies - Snacks: 1  - Has CKD, last BUN/creatinine:  Lab Results  Component Value Date   BUN 22 03/28/2014   CREATININE 1.9* 03/28/2014   - last set of lipids: Lab Results  Component Value Date   CHOL 153 03/14/2014   HDL 33.50* 03/14/2014   LDLCALC NOT CALC 12/11/2013   LDLDIRECT 84.8 03/14/2014   TRIG 259.0* 03/14/2014   CHOLHDL 5 03/14/2014  She is on Crestor, Tricor. - She  has + DR. Last eye exam: 05/2014. - + numbness and tingling in her feet.  I reviewed pt's medications, allergies, PMH, social hx, family hx and no changes required, except as mentioned above.  ROS: Constitutional: no weight loss, no fatigue, + subjective hypothermia, + nocturia - 3-4x  Eyes: no blurry vision, no xerophthalmia ENT: no sore throat, no nodules palpated in throat, no dysphagia/odynophagia, + hoarseness Cardiovascular: no CP/SOB/palpitations/+ leg swelling Respiratory: no cough/SOB Gastrointestinal: no N/V/D/+ C Musculoskeletal: no muscle/joint aches Skin: no rashes Neurological: no tremors/numbness/tingling/dizziness  PE: BP 138/66 mmHg  Pulse 117  Temp(Src) 98.7 F (37.1 C) (Oral)  Resp 12  Wt 135 lb 12.8 oz (61.598 kg)  SpO2 96%  LMP 07/06/1978 Body mass index is 27.41 kg/(m^2).   Wt Readings from Last 3 Encounters:  06/11/14 135 lb 12.8 oz (61.598 kg)  06/08/14 134 lb (60.782 kg)  06/04/14 141 lb (63.957 kg)   Constitutional: overweight, in NAD Eyes: PERRLA, EOMI, no exophthalmos ENT: moist mucous membranes, no thyromegaly, no cervical lymphadenopathy Cardiovascular: RRR, No MRG Respiratory: CTA B Gastrointestinal: abdomen soft, NT, ND, BS+ Musculoskeletal: no deformities, strength intact in all 4 Skin: moist, warm, no rashes Neurological: no tremor with outstretched hands, DTR normal in all  4  ASSESSMENT: 1. DM2, insulin-dependent, uncontrolled, with complications - cerebrovascular disease - h/o stroke in 2009 - CKD - DR - PN  PLAN:  1. Patient with long-standing, uncontrolled diabetes, on VGo 20 + glipizide 5 mg bid. Sugars still high, but improved. Will increase the bolusing. - I advised her to:  Patient Instructions  Please continue VGo 20. - Use 4 clicks for a smaller meal and 5 clicks for a larger meal - continue Glipizide 5 mg 2x a day before meals. Please return in 1.5 month with your sugar log.  Please let me know if the sugars are  consistently <80 or >200. - I also advised her to: Stop eating bagels >> use whole wheat bread Stop buying salad dressing >> prepare a vinaigrette herself Since she noticed she has the highest sugars after the above foods - continue checking sugars at different times of the day - check 3 times a day, rotating checks - advised for yearly eye exams >> she is UTD - had the flu vaccine this season - check HbA1c - Return to clinic in 1.5 mo with sugar log   Office Visit on 06/11/2014  Component Date Value Ref Range Status  . Hgb A1c MFr Bld 06/11/2014 8.7* 4.6 - 6.5 % Final   Glycemic Control Guidelines for People with Diabetes:Non Diabetic:  <6%Goal of Therapy: <7%Additional Action Suggested:  >8%   HbA1c increased.  Will advise her to increase Glipizide in am to 10 mg.

## 2014-06-11 NOTE — Patient Instructions (Signed)
Please continue VGo 20. - Use 4 clicks for a smaller meal and 5 clicks for a larger meal - continue Glipizide 5 mg 2x a day before meals. Please return in 1.5 month with your sugar log.  Please let me know if the sugars are consistently <80 or >200.

## 2014-06-12 ENCOUNTER — Ambulatory Visit (HOSPITAL_BASED_OUTPATIENT_CLINIC_OR_DEPARTMENT_OTHER)
Admission: RE | Admit: 2014-06-12 | Discharge: 2014-06-12 | Disposition: A | Discharge status: Home / Self Care | Payer: Medicare Other | Source: Ambulatory Visit | Attending: Family | Admitting: Family

## 2014-06-12 DIAGNOSIS — M7122 Synovial cyst of popliteal space [Baker], left knee: Secondary | ICD-10-CM | POA: Insufficient documentation

## 2014-06-12 DIAGNOSIS — M7989 Other specified soft tissue disorders: Secondary | ICD-10-CM

## 2014-06-13 ENCOUNTER — Ambulatory Visit: Payer: Medicare Other | Admitting: Family

## 2014-06-19 ENCOUNTER — Telehealth: Payer: Self-pay | Admitting: Family

## 2014-06-19 ENCOUNTER — Other Ambulatory Visit (INDEPENDENT_AMBULATORY_CARE_PROVIDER_SITE_OTHER): Payer: Medicare Other

## 2014-06-19 DIAGNOSIS — N289 Disorder of kidney and ureter, unspecified: Secondary | ICD-10-CM

## 2014-06-19 DIAGNOSIS — D509 Iron deficiency anemia, unspecified: Secondary | ICD-10-CM

## 2014-06-19 DIAGNOSIS — N189 Chronic kidney disease, unspecified: Secondary | ICD-10-CM

## 2014-06-19 DIAGNOSIS — D72829 Elevated white blood cell count, unspecified: Secondary | ICD-10-CM

## 2014-06-19 DIAGNOSIS — E876 Hypokalemia: Secondary | ICD-10-CM

## 2014-06-19 LAB — CBC WITH DIFFERENTIAL/PLATELET
BASOS ABS: 0 10*3/uL (ref 0.0–0.1)
Basophils Relative: 0.1 % (ref 0.0–3.0)
Eosinophils Absolute: 0 10*3/uL (ref 0.0–0.7)
Eosinophils Relative: 0.2 % (ref 0.0–5.0)
HCT: 31 % — ABNORMAL LOW (ref 36.0–46.0)
Hemoglobin: 10.1 g/dL — ABNORMAL LOW (ref 12.0–15.0)
Lymphs Abs: 0.8 10*3/uL (ref 0.7–4.0)
MCHC: 32.4 g/dL (ref 30.0–36.0)
MCV: 82.1 fl (ref 78.0–100.0)
MONOS PCT: 4.7 % (ref 3.0–12.0)
Monocytes Absolute: 0.7 10*3/uL (ref 0.1–1.0)
Neutro Abs: 13.8 10*3/uL — ABNORMAL HIGH (ref 1.4–7.7)
PLATELETS: 572 10*3/uL — AB (ref 150.0–400.0)
RBC: 3.78 Mil/uL — ABNORMAL LOW (ref 3.87–5.11)
RDW: 14.1 % (ref 11.5–15.5)
WBC: 15.3 10*3/uL — ABNORMAL HIGH (ref 4.0–10.5)

## 2014-06-19 LAB — BASIC METABOLIC PANEL
BUN: 19 mg/dL (ref 6–23)
CALCIUM: 9.7 mg/dL (ref 8.4–10.5)
CO2: 24 mEq/L (ref 19–32)
Chloride: 103 mEq/L (ref 96–112)
Creatinine, Ser: 1.8 mg/dL — ABNORMAL HIGH (ref 0.4–1.2)
GFR: 30 mL/min — ABNORMAL LOW (ref >60.00)
Glucose, Bld: 51 mg/dL — ABNORMAL LOW (ref 70–99)
POTASSIUM: 3.1 meq/L — AB (ref 3.5–5.1)
SODIUM: 136 meq/L (ref 135–145)

## 2014-06-19 MED ORDER — POTASSIUM CHLORIDE CRYS ER 20 MEQ PO TBCR: 20.0000 meq | EXTENDED_RELEASE_TABLET | Freq: Every day | ORAL | Status: DC

## 2014-06-19 NOTE — Telephone Encounter (Signed)
Potassium is low. D/c potassium gluconate, start [place] 20- take 2 tabs today, then one tab once daily.  Repeat bmet in 1 week, dx hypokalemia. WBC is elevated.  When she returns to the lab I would like her to also complete a urine culture to make sure she does not have infection. Has she taken any recent steroids?

## 2014-06-20 NOTE — Telephone Encounter (Signed)
Notified pt of below recommendations. She states that she was out of her otc potassium supplement x 2 weeks before she saw [place] last visit and has not restarted it yet. Advised pt to restart otc potassium supplement and return for repeat bmet as instructed below. Future lab orders entered and lab appt scheduled for 06/27/14 at 9:45am. Pt states she has not had any steroids.  Please advise if further recommendations?

## 2014-06-20 NOTE — Telephone Encounter (Signed)
Patient states that she is returning Tricia's call. Best # [phone]

## 2014-06-20 NOTE — Telephone Encounter (Signed)
Noted, no further recs.

## 2014-06-27 ENCOUNTER — Other Ambulatory Visit (INDEPENDENT_AMBULATORY_CARE_PROVIDER_SITE_OTHER): Payer: Medicare Other

## 2014-06-27 DIAGNOSIS — D72829 Elevated white blood cell count, unspecified: Secondary | ICD-10-CM

## 2014-06-27 DIAGNOSIS — E876 Hypokalemia: Secondary | ICD-10-CM

## 2014-06-27 LAB — BASIC METABOLIC PANEL
BUN: 15 mg/dL (ref 6–23)
CO2: 22 meq/L (ref 19–32)
Calcium: 9 mg/dL (ref 8.4–10.5)
Chloride: 102 mEq/L (ref 96–112)
Creatinine, Ser: 1.6 mg/dL — ABNORMAL HIGH (ref 0.4–1.2)
GFR: 34.74 mL/min — ABNORMAL LOW (ref >60.00)
Glucose, Bld: 316 mg/dL — ABNORMAL HIGH (ref 70–99)
Potassium: 4.3 mEq/L (ref 3.5–5.1)
Sodium: 133 mEq/L — ABNORMAL LOW (ref 135–145)

## 2014-06-30 ENCOUNTER — Telehealth: Payer: Self-pay | Admitting: Family

## 2014-06-30 DIAGNOSIS — D72829 Elevated white blood cell count, unspecified: Secondary | ICD-10-CM

## 2014-06-30 LAB — URINE CULTURE

## 2014-06-30 MED ORDER — AMOXICILLIN-POT CLAVULANATE 875-125 MG PO TABS: 1.0000 | ORAL_TABLET | Freq: Two times a day (BID) | ORAL | Status: DC

## 2014-06-30 NOTE — Telephone Encounter (Signed)
Urine culture + for infection.  Start augmentin.  Repeat cbc in 2 weeks, dx leukocytosis.

## 2014-07-02 ENCOUNTER — Other Ambulatory Visit: Payer: Self-pay | Admitting: Family

## 2014-07-02 NOTE — Telephone Encounter (Signed)
Pt notified and made aware. Lab appt scheduled for 07/16/14 @ 9:45 am.  Future cbcd ordered.

## 2014-07-02 NOTE — Telephone Encounter (Signed)
Last Filled:  02/06/14 Amt:  60, 3 refills Last OV:  06/08/14  Med filled.

## 2014-07-02 NOTE — Telephone Encounter (Signed)
Pt returning your call

## 2014-07-02 NOTE — Telephone Encounter (Signed)
Left a message for call back.  

## 2014-07-05 ENCOUNTER — Telehealth: Payer: Self-pay

## 2014-07-05 NOTE — Telephone Encounter (Signed)
LVM for pt to call back.    RE: eye exam for 2015 since dx of retinopathy was given in 2014 must have eye exam in 2015 to close gap.

## 2014-07-10 ENCOUNTER — Telehealth: Payer: Self-pay

## 2014-07-10 NOTE — Telephone Encounter (Signed)
[facility] is sending over [place] notes from 05/2014 exam.

## 2014-07-10 NOTE — Telephone Encounter (Signed)
[facility] was called at [phone]

## 2014-07-13 ENCOUNTER — Telehealth: Payer: Self-pay | Admitting: *Deleted

## 2014-07-13 DIAGNOSIS — R918 Other nonspecific abnormal finding of lung field: Secondary | ICD-10-CM

## 2014-07-13 MED ORDER — ALENDRONATE SODIUM 70 MG PO TABS: 70.0000 mg | ORAL_TABLET | ORAL | Status: DC

## 2014-07-13 NOTE — Telephone Encounter (Signed)
Received refill request from [facility] for alendronate 70mg  once a week.  Refills sent. Pt last seen by PCP 06/2014 and has no future appointments on file.  Please advise when pt should follow up in the future?

## 2014-07-16 ENCOUNTER — Other Ambulatory Visit (INDEPENDENT_AMBULATORY_CARE_PROVIDER_SITE_OTHER): Payer: Medicare Other

## 2014-07-16 ENCOUNTER — Telehealth: Payer: Self-pay | Admitting: Family

## 2014-07-16 DIAGNOSIS — D509 Iron deficiency anemia, unspecified: Secondary | ICD-10-CM

## 2014-07-16 DIAGNOSIS — D72829 Elevated white blood cell count, unspecified: Secondary | ICD-10-CM

## 2014-07-16 LAB — CBC WITH DIFFERENTIAL/PLATELET
BASOS PCT: 0.7 % (ref 0.0–3.0)
Basophils Absolute: 0.1 10*3/uL (ref 0.0–0.1)
EOS ABS: 0.2 10*3/uL (ref 0.0–0.7)
EOS PCT: 1.9 % (ref 0.0–5.0)
HEMATOCRIT: 28 % — AB (ref 36.0–46.0)
HEMOGLOBIN: 9.2 g/dL — AB (ref 12.0–15.0)
LYMPHS ABS: 1.6 10*3/uL (ref 0.7–4.0)
Lymphocytes Relative: 19.7 % (ref 12.0–46.0)
MCHC: 32.7 g/dL (ref 30.0–36.0)
MCV: 81.1 fl (ref 78.0–100.0)
MONO ABS: 0.4 10*3/uL (ref 0.1–1.0)
Monocytes Relative: 5.5 % (ref 3.0–12.0)
Neutro Abs: 5.8 10*3/uL (ref 1.4–7.7)
Neutrophils Relative %: 72.2 % (ref 43.0–77.0)
Platelets: 503 10*3/uL — ABNORMAL HIGH (ref 150.0–400.0)
RBC: 3.46 Mil/uL — ABNORMAL LOW (ref 3.87–5.11)
RDW: 14.3 % (ref 11.5–15.5)
WBC: 8 10*3/uL (ref 4.0–10.5)

## 2014-07-16 NOTE — Telephone Encounter (Signed)
Notified pt and she voices understanding. Scheduled 3 month follow up for 09/10/14 at 8:30am.

## 2014-07-16 NOTE — Telephone Encounter (Signed)
Pt will be due for 3 month follow up. She is due for a follow up CT chest however this month, to follow up on some pulmonary nodules.  Pended below. She should also repeat lab work today as scheduled.

## 2014-07-16 NOTE — Telephone Encounter (Signed)
See previous phone note.  

## 2014-07-16 NOTE — Telephone Encounter (Signed)
Left message on home # to return my call. 

## 2014-07-16 NOTE — Telephone Encounter (Signed)
Pt returning call [phone]

## 2014-07-17 ENCOUNTER — Telehealth: Payer: Self-pay | Admitting: Internal Medicine

## 2014-07-17 NOTE — Telephone Encounter (Signed)
Pt returning call

## 2014-07-17 NOTE — Telephone Encounter (Signed)
Patient stated that pharmacy told her the cost of the V Go pump is over $100 dollars she can't afford it, is there any other alternative?

## 2014-07-17 NOTE — Telephone Encounter (Signed)
We can switch back to Lantus and Humalog, if these are afordable, or NPH and Regular if not.

## 2014-07-17 NOTE — Telephone Encounter (Signed)
Called pt and lvm advising her per Dr Arman Filter note. Advised pt to call back and let us know what she would like to do.

## 2014-07-17 NOTE — Telephone Encounter (Signed)
Please read note below and advise.  

## 2014-07-18 ENCOUNTER — Telehealth: Payer: Self-pay | Admitting: Family

## 2014-07-18 DIAGNOSIS — D509 Iron deficiency anemia, unspecified: Secondary | ICD-10-CM | POA: Diagnosis not present

## 2014-07-18 DIAGNOSIS — D72829 Elevated white blood cell count, unspecified: Secondary | ICD-10-CM

## 2014-07-18 LAB — IRON: Iron: 29 ug/dL — ABNORMAL LOW (ref 42–145)

## 2014-07-18 NOTE — Telephone Encounter (Signed)
Iron is still low. Is she taking supplement twice daily?  If so, increase to TID.

## 2014-07-18 NOTE — Telephone Encounter (Signed)
Please order the cheaper insulin

## 2014-07-18 NOTE — Telephone Encounter (Signed)
Called pt back and lvm.

## 2014-07-19 ENCOUNTER — Other Ambulatory Visit (HOSPITAL_BASED_OUTPATIENT_CLINIC_OR_DEPARTMENT_OTHER): Payer: Medicare Other

## 2014-07-19 MED ORDER — "INSULIN SYRINGE-NEEDLE U-100 31G X 5/16"" 0.5 ML MISC": Status: DC

## 2014-07-19 MED ORDER — INSULIN REGULAR HUMAN 100 UNIT/ML IJ SOLN: INTRAMUSCULAR | Status: DC

## 2014-07-19 MED ORDER — INSULIN NPH (HUMAN) (ISOPHANE) 100 UNIT/ML ~~LOC~~ SUSP: SUBCUTANEOUS | Status: DC

## 2014-07-19 NOTE — Telephone Encounter (Signed)
Called pt and lvm advising her per Dr Arman Filter note. Advised pt to call with any questions. Rx's sent to pt's pharmacy.

## 2014-07-19 NOTE — Telephone Encounter (Signed)
Spoke with patient who states that she takes BID. Advised recommendations. She will start with TID.

## 2014-07-19 NOTE — Telephone Encounter (Signed)
Please read note below. I believe the NPH and the R would be the cheapest. Please advise.

## 2014-07-19 NOTE — Telephone Encounter (Signed)
Let's call in syringes - 0.5 mL and vials of: - NPH ReliOn 15 units before b'fast and 10 units before dinner - Regular ReliOn:  8 units before a smaller meal 10 units before a larger meal  See below: Insulin Before breakfast Before lunch Before dinner  Regular 8 - smaller meal 10 - larger meal 8 - smaller meal 10 - larger meal 8 - smaller meal 10 - larger meal  NPH 15  10   Please inject the insulin 30 min before meals.  She can mix the insulins in the same column.  She may need to come to see Vaughan Basta to have all these explained - please check with her if she prefers this.

## 2014-07-24 ENCOUNTER — Ambulatory Visit (INDEPENDENT_AMBULATORY_CARE_PROVIDER_SITE_OTHER): Payer: Medicare Other | Admitting: Internal Medicine

## 2014-07-24 ENCOUNTER — Encounter: Payer: Self-pay | Admitting: Internal Medicine

## 2014-07-24 ENCOUNTER — Ambulatory Visit (HOSPITAL_BASED_OUTPATIENT_CLINIC_OR_DEPARTMENT_OTHER)
Admission: RE | Admit: 2014-07-24 | Discharge: 2014-07-24 | Disposition: A | Discharge status: Home / Self Care | Payer: Medicare Other | Source: Ambulatory Visit | Attending: Family | Admitting: Family

## 2014-07-24 VITALS — BP 128/72 | HR 103 | Temp 98.3°F | Resp 12 | Wt 130.6 lb

## 2014-07-24 DIAGNOSIS — R918 Other nonspecific abnormal finding of lung field: Secondary | ICD-10-CM | POA: Insufficient documentation

## 2014-07-24 DIAGNOSIS — E1165 Type 2 diabetes mellitus with hyperglycemia: Principal | ICD-10-CM

## 2014-07-24 DIAGNOSIS — IMO0002 Reserved for concepts with insufficient information to code with codable children: Secondary | ICD-10-CM

## 2014-07-24 DIAGNOSIS — E1141 Type 2 diabetes mellitus with diabetic mononeuropathy: Secondary | ICD-10-CM

## 2014-07-24 DIAGNOSIS — E1149 Type 2 diabetes mellitus with other diabetic neurological complication: Secondary | ICD-10-CM

## 2014-07-24 NOTE — Patient Instructions (Signed)
Please stop Glipizide. Please change the insulin regimen as follows: Insulin Before breakfast Before lunch Before dinner  Regular 10 - smaller meal 12 - larger meal 10 - smaller meal 12 - larger meal 10 - smaller meal 12 - larger meal  NPH 20  10  Please return in 1.5 month with your sugar log.

## 2014-07-24 NOTE — Progress Notes (Signed)
Patient ID: Michaela Shaw, female   DOB: Jan 09, 1941, 74 y.o.   MRN: [id number]  HPI: Michaela Shaw is a 74 y.o.-year-old female, returning for f/u for DM2, dx 2005, insulin-dependent since 2007, uncontrolled, with complications (cerebrovascular disease - h/o stroke in 2009, CKD, DR, PN). Last visit 1.5 mo ago.  Last hemoglobin A1c was: Lab Results  Component Value Date   HGBA1C 8.7* 06/11/2014   HGBA1C 8.0* 03/14/2014   HGBA1C 8.5* 12/11/2013   She is on a DM study med - once a week.  She used a VGo20 mechanical pump - now not covered by insurance. 3 clicks per smaller meal. 4 clicks for a larger meal. Tradjenta 5 mg in am. Glipizide 5 mg bid - started 04/2014, then increased to 10 mg bid.  Since last visit, due to cost reasons, we switched to: Insulin Before breakfast Before lunch Before dinner  Regular 8 - smaller meal 10 - larger meal 8 - smaller meal 10 - larger meal 8 - smaller meal 10 - larger meal  NPH 15  10  Glipizide 10 mg 2x a day.  Pt checks her sugars 1-3x a day and they are fluctuating: - am: 27, 35, 39 - 257 >> 71, 76-91, 150 this am >> 89-298 >> 82-205, 216, 276 >> 54, 61, 66-150, 186 - 2h after b'fast: n/c >> 44, 196-HI >> 138-347 >> 91-341, 435 - before lunch: 126 >> 160-230 >> 297-411 >> n/c >> 104-288 - 2h after lunch: n/c >> 229-429 >> 117, 164-257, 350 >> 111-222 - before dinner: n/c >> 160-230 >> 84, 121-485 >> 83-160, 334 >> 97-141, 341 - 2h after dinner: n/c >> 119-450 >> 111-210, 363 >> 86, 145-313 - bedtime: n/c >> 174-311 >> 191 Had 2x lows in am. Lowest sugar was 54 since last visit; No hypoglycemia awareness! Highest sugar was 400s  Pt's meals are: - Breakfast:1/2 bagel - Lunch: 1/2 bagel - Dinner: chicken + veggies - Snacks: 1  - Has CKD, last BUN/creatinine:  Lab Results  Component Value Date   BUN 15 06/27/2014   CREATININE 1.6* 06/27/2014   - last set of lipids: Lab Results  Component Value Date   CHOL 153 03/14/2014   HDL 33.50*  03/14/2014   LDLCALC NOT CALC 12/11/2013   LDLDIRECT 84.8 03/14/2014   TRIG 259.0* 03/14/2014   CHOLHDL 5 03/14/2014  She is on Crestor, Tricor. - She has + DR. Last eye exam: 05/2014. - + numbness and tingling in her feet.  I reviewed pt's medications, allergies, PMH, social hx, family hx, and changes were documented in the history of present illness. Otherwise, unchanged from my initial visit note.  ROS: Constitutional: no weight loss, no fatigue, + nocturia - 3-4x  Eyes: no blurry vision, no xerophthalmia ENT: no sore throat, no nodules palpated in throat, no dysphagia/odynophagia, no hoarseness Cardiovascular: no CP/SOB/palpitations/leg swelling Respiratory: no cough/SOB Gastrointestinal: no N/V/D/C Musculoskeletal: no muscle/joint aches Skin: no rashes Neurological: no tremors/numbness/tingling/dizziness  PE: BP 128/72 mmHg  Pulse 103  Temp(Src) 98.3 F (36.8 C) (Oral)  Resp 12  Wt 130 lb 9.6 oz (59.24 kg)  SpO2 98%  LMP 07/06/1978 Body mass index is 26.36 kg/(m^2).   Wt Readings from Last 3 Encounters:  07/24/14 130 lb 9.6 oz (59.24 kg)  06/11/14 135 lb 12.8 oz (61.598 kg)  06/08/14 134 lb (60.782 kg)   Constitutional: overweight, in NAD Eyes: PERRLA, EOMI, no exophthalmos ENT: moist mucous membranes, no thyromegaly, no cervical lymphadenopathy Cardiovascular: RRR, No  MRG Respiratory: CTA B Gastrointestinal: abdomen soft, NT, ND, BS+ Musculoskeletal: no deformities, strength intact in all 4 Skin: moist, warm, no rashes Neurological: no tremor with outstretched hands, DTR normal in all 4  ASSESSMENT: 1. DM2, insulin-dependent, uncontrolled, with complications - cerebrovascular disease - h/o stroke in 2009 - CKD - DR - PN  PLAN:  1. Patient with long-standing, uncontrolled diabetes, now on basal-bolus regimen with insulin N and R. Sugars still high, and had 2 lows in am. Will increase the bolusing and the NPH in am. Will stop Glipizide to avoid further lows.  I  also  Advised her to stop eating PB which increases her sugars greatly. - I advised her to: Patient Instructions   Please stop Glipizide. Please change the insulin regimen as follows: Insulin Before breakfast Before lunch Before dinner  Regular 10 - smaller meal 12 - larger meal 10 - smaller meal 12 - larger meal 10 - smaller meal 12 - larger meal  NPH 20  10  Please return in 1.5 month with your sugar log.   - continue checking sugars at different times of the day - check 3 times a day, rotating checks - advised for yearly eye exams >> she is UTD - had the flu vaccine this season - Return to clinic in 1.5 mo with sugar log

## 2014-07-25 ENCOUNTER — Telehealth: Payer: Self-pay | Admitting: Family

## 2014-07-25 ENCOUNTER — Ambulatory Visit: Payer: Medicare Other | Admitting: Family

## 2014-07-25 ENCOUNTER — Telehealth: Payer: Self-pay | Admitting: *Deleted

## 2014-07-25 ENCOUNTER — Telehealth: Payer: Self-pay | Admitting: Internal Medicine

## 2014-07-25 NOTE — Telephone Encounter (Signed)
Pt did not show for appointment 07/25/14 at 9:30am for follow up on CT.  Charge no show fee?

## 2014-07-25 NOTE — Telephone Encounter (Signed)
Attempted to reach pt again and received voicemail.  Did not leave message.

## 2014-07-25 NOTE — Telephone Encounter (Signed)
Noted. Agree.

## 2014-07-25 NOTE — Telephone Encounter (Signed)
Left message on home # to return my call. 

## 2014-07-25 NOTE — Telephone Encounter (Signed)
Per verbal from Provider, need to bring pt in for f/u from CT chest today. I have placed pt in the 9:30am slot as that was the only opening we had today.  Left message on home # to return my call.

## 2014-07-25 NOTE — Telephone Encounter (Signed)
Returned pt's call. Advised her to stop the glipizide. Pt understood.

## 2014-07-25 NOTE — Telephone Encounter (Signed)
-----   Message from Rigoberto Noel, MD sent at 07/24/2014 11:09 PM EST ----- Maybe  - or CT guided biopsy of one of the nodules. Will need to see her - seems like malignancy Do you want me to see her at [facility] office next Thursday?  RA  ----- Message -----    From: Debbrah Alar, NP    Sent: 07/24/2014   5:51 PM      To: Rigoberto Noel, MD  Could you please glance at her CT chest? Would she be a candidate for bronch with biopsy?    Thanks,  [employer]

## 2014-07-25 NOTE — Telephone Encounter (Signed)
Patient states that she still has Glipizide on her medications list; is the patient supposed to be taking this?    Please advise    Thank you

## 2014-07-25 NOTE — Telephone Encounter (Signed)
No charge for missed appt today. Pt was not aware of appt and has scheduled f/u for 07/26/14.

## 2014-07-26 ENCOUNTER — Encounter: Payer: Self-pay | Admitting: Family

## 2014-07-26 ENCOUNTER — Ambulatory Visit (INDEPENDENT_AMBULATORY_CARE_PROVIDER_SITE_OTHER): Payer: Medicare Other | Admitting: Family

## 2014-07-26 VITALS — BP 134/59 | HR 93 | Temp 98.3°F | Resp 16 | Ht 59.0 in | Wt 130.8 lb

## 2014-07-26 DIAGNOSIS — R9389 Abnormal findings on diagnostic imaging of other specified body structures: Secondary | ICD-10-CM

## 2014-07-26 DIAGNOSIS — E876 Hypokalemia: Secondary | ICD-10-CM

## 2014-07-26 DIAGNOSIS — R918 Other nonspecific abnormal finding of lung field: Secondary | ICD-10-CM

## 2014-07-26 DIAGNOSIS — R938 Abnormal findings on diagnostic imaging of other specified body structures: Secondary | ICD-10-CM

## 2014-07-26 LAB — BASIC METABOLIC PANEL
BUN: 17 mg/dL (ref 6–23)
CO2: 22 meq/L (ref 19–32)
Calcium: 10.1 mg/dL (ref 8.4–10.5)
Chloride: 100 mEq/L (ref 96–112)
Creatinine, Ser: 1.56 mg/dL — ABNORMAL HIGH (ref 0.40–1.20)
GFR: 34.48 mL/min — AB (ref >60.00)
Glucose, Bld: 310 mg/dL — ABNORMAL HIGH (ref 70–99)
POTASSIUM: 3.9 meq/L (ref 3.5–5.1)
Sodium: 133 mEq/L — ABNORMAL LOW (ref 135–145)

## 2014-07-26 NOTE — Progress Notes (Signed)
Subjective:    Patient ID: Michaela Shaw, female    DOB: July 16, 1940, 74 y.o.   MRN: [id number]  HPI  Michaela Shaw is a 74 yr old female who presents today for follow up of her CT scan of her chest.  She performed a 6 month follow up CT scan of her chest per radiology recommendation. She had a CT of the chest performed on 01/09/14 which noted multiple right lung nodules with associated fibrotic changes and calcified hilar lymph nodes.  It was felt by radiology that the overall appearance was most consistent with a  Post infectious or post inflammatory process.  Malignancy was considered less likely and a follow up Ct of the chest in 6 months was recommended. CT of the chest performed on 07/24/14 unfortunately showed enlargement of the individual nodular densities in the right lung.  There was note of RML lesion of 15 x 11 mm which previously measured 10 x 9. Also the 16 mm lesion seen on prior study was measured at 43m.  It was felt that the new small nodules in the left lung are suspicious for metastatic disease.    Wt Readings from Last 3 Encounters:  07/26/14 130 lb 12.8 oz (59.33 kg)  07/24/14 130 lb 9.6 oz (59.24 kg)  06/11/14 135 lb 12.8 oz (61.598 kg)    Review of Systems See HPI  Past Medical History  Diagnosis Date  . History of chicken pox   . Diabetes mellitus without complication   . Hyperlipidemia   . Hypertension   . Stroke 2011    "mild" per pt.   . Seizures 2005  . Type 2 diabetes mellitus with neurological manifestations, uncontrolled 03/05/2013    History   Social History  . Marital Status: Legally Separated    Spouse Name: N/A    Number of Children: N/A  . Years of Education: N/A   Occupational History  . Not on file.   Social History Main Topics  . Smoking status: Never Smoker   . Smokeless tobacco: Never Used  . Alcohol Use: No  . Drug Use: No  . Sexual Activity: Not on file   Other Topics Concern  . 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 Bladder suspension  1980  . Abdominal hysterectomy  1980    partial  . Cholecystectomy  2007  . Appendectomy  2007  . Spine surgery  2012    Has rods and pins    Family History  Problem Relation Age of Onset  . Heart disease Mother   . Diabetes Mother   . Alzheimer's disease Father   . Cancer Sister     colon  . Diabetes Sister   . Diabetes Daughter   . Diabetes Sister     Allergies  Allergen Reactions  . Metformin And Related Other (See Comments)    Kidney function    Current Outpatient Prescriptions on File Prior to Visit  Medication Sig Dispense Refill  . alendronate (FOSAMAX) 70 MG tablet Take 1 tablet (70 mg total) by mouth every 7 (seven) days. Take with a full glass of water on an empty stomach. 4 tablet  5  . amLODipine (NORVASC) 10 MG tablet     . amoxicillin-clavulanate (AUGMENTIN) 875-125 MG per tablet Take 1 tablet by mouth 2 (two) times daily. 14 tablet 0  . aspirin 81 MG tablet Take 81 mg by mouth daily.    . Blood Glucose Monitoring Suppl (ONETOUCH VERIO) W/DEVICE KIT 1 each by Does not apply route daily. Please use to test blood sugar 4 times as instructed. Dx code: E11.40 1 kit 0  . Calcium Carbonate-Vitamin D (CALCIUM 600 + D PO) Take 1 tablet by mouth every morning.    . cilostazol (PLETAL) 100 MG tablet Take 1 tablet (100 mg total) by mouth 2 (two) times daily before a meal. 60 tablet 11  . cloNIDine (CATAPRES) 0.2 MG tablet TAKE ONE TABLET BY MOUTH TWICE DAILY 60 tablet 5  . co-enzyme Q-10 30 MG capsule Take 100 mg by mouth daily.    . fenofibrate (TRICOR) 145 MG tablet TAKE ONE TABLET BY MOUTH ONCE DAILY 30 tablet 3  . ferrous sulfate 325 (65 FE) MG tablet Take 325 mg by mouth 2 (two) times daily with a meal.    . glipiZIDE (GLUCOTROL) 5  MG tablet TAKE ONE TABLET BY MOUTH TWICE DAILY BEFORE MEAL(S) 60 tablet 2  . glucose blood (ONETOUCH VERIO) test strip Use to test blood sugar 4 times daily as instructed. Dx code: E11.40 200 each 5  . Insulin Disposable Pump (V-GO 20) KIT Use one a day 90 kit 3  . insulin lispro (HUMALOG) 100 UNIT/ML injection To use in VGo 20 (up to 60 units daily) 60 mL 3  . insulin NPH Human (HUMULIN N,NOVOLIN N) 100 UNIT/ML injection Inject 15 units into the skin before breakfast, mixed with the Regular insulin and 10 units into the skin before dinner, mixed with the Regular insulin. 10 mL 2  . insulin regular (NOVOLIN R,HUMULIN R) 100 units/mL injection Inject 8 units into the skin with a smaller meal and 10 units with a larger meal, 3 times daily 30 minutes before meals, as advised. 10 mL 2  . Insulin Syringe-Needle U-100 (RELION INSULIN SYR 0.3CC/30G) 30G X 5/16" 0.3 ML MISC Inject 1 each into the skin 2 (two) times daily.    . Insulin Syringe-Needle U-100 31G X 5/16" 0.5 ML MISC Use to inject insulin 3 times daily. 100 each 11  . omeprazole (PRILOSEC) 20 MG capsule TAKE ONE CAPSULE BY MOUTH IN THE MORNING 30 capsule 3  . ONETOUCH DELICA LANCETS 64B MISC Use to test blood sugar 4 times daily as instructed. 200 each 5  . potassium chloride SA (K-DUR,KLOR-CON) 20 MEQ tablet Take 1 tablet (20 mEq total) by mouth daily. 30 tablet 3  . rosuvastatin (CRESTOR) 20 MG tablet Take 1 tablet (20 mg total) by mouth daily. 90 tablet 3   No current facility-administered medications on file prior to visit.    BP 134/59 mmHg  Pulse 93  Temp(Src) 98.3 F (36.8 C) (Oral)  Resp 16  Ht 4' 11"  (1.499 m)  Wt 130 lb 12.8 oz (59.33 kg)  BMI 26.40 kg/m2  SpO2 98%  LMP 07/06/1978       Objective:   Physical Exam  Constitutional: She is oriented to person, place, and time. She appears well-developed and well-nourished. No distress.  HENT:  Head: Normocephalic and atraumatic.  Cardiovascular: Normal rate and regular  rhythm.   No murmur heard. Pulmonary/Chest: Effort normal and breath sounds normal. No respiratory distress. She has no wheezes. She has no rales.  She exhibits no tenderness.  Neurological: She is alert and oriented to person, place, and time.  Psychiatric: She has a normal mood and affect. Her behavior is normal. Judgment and thought content normal.          Assessment & Plan:  20 min spent with pt today. >50% of this time was spent counseling pt on CT findings and planned work up.

## 2014-07-26 NOTE — Patient Instructions (Signed)
Please complete lab work prior to leaving. You will be contacted about your PET scan. Please keep upcoming appointment with Dr. Elsworth Soho. Follow up in 1 month.

## 2014-07-26 NOTE — Progress Notes (Signed)
Pre visit review using our clinic review tool, if applicable. No additional management support is needed unless otherwise documented below in the visit note. 

## 2014-07-28 ENCOUNTER — Encounter: Payer: Self-pay | Admitting: Family

## 2014-07-28 NOTE — Assessment & Plan Note (Addendum)
Concerning for malignancy. Will refer for PET scan and refer to pulmonology- pt has apt scheduled with Dr. Elsworth Soho on Thursday.  Will need tissue sample- either via bronch or via CT guided biopsy- defer to Dr. Elsworth Soho. Once tissue sample has been obtained, will plan referral to biopsy.  Long discussion had with pt re: findings, work up today.  Pt verbalized understanding.  Her daughter is also present for discussion today.   Her weight loss, hyponatremia, and anemia are also likely related to this underlying malignancy.

## 2014-07-30 ENCOUNTER — Telehealth: Payer: Self-pay | Admitting: Family

## 2014-07-30 NOTE — Telephone Encounter (Signed)
Notified pt and she voices understanding. 

## 2014-07-30 NOTE — Telephone Encounter (Signed)
Please contact pt and let her know that I spoke with nuclear medicine and she is not to take insulin the morning of her PET scan. She can take after test is complete.

## 2014-08-01 ENCOUNTER — Other Ambulatory Visit: Payer: Self-pay | Admitting: Family

## 2014-08-01 NOTE — Telephone Encounter (Signed)
Rx request to pharmacy/SLS  

## 2014-08-02 ENCOUNTER — Encounter: Payer: Self-pay | Admitting: Pulmonary Disease

## 2014-08-02 ENCOUNTER — Ambulatory Visit (INDEPENDENT_AMBULATORY_CARE_PROVIDER_SITE_OTHER): Payer: Medicare Other | Admitting: Pulmonary Disease

## 2014-08-02 VITALS — BP 142/80 | HR 96 | Ht 59.0 in | Wt 133.0 lb

## 2014-08-02 DIAGNOSIS — R590 Localized enlarged lymph nodes: Secondary | ICD-10-CM

## 2014-08-02 DIAGNOSIS — R599 Enlarged lymph nodes, unspecified: Secondary | ICD-10-CM

## 2014-08-02 DIAGNOSIS — R918 Other nonspecific abnormal finding of lung field: Secondary | ICD-10-CM

## 2014-08-02 NOTE — Assessment & Plan Note (Signed)
Unfortunately, nodules have increased in size, as has subcarinal lymphadenopathy in the right hilar mass. Reviewed the CT scan-does not show an obvious endobronchial lesion. 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 We'll proceed with a PET scan, if the nodules in the subcarinal adenopathy light up, then we will proceed with bronchoscopy with endobronchial ultrasound,to obtain aspiration Biopsies of subcarinal lymph node. The procedure was discussed with the patient and her daughter in detail. Risks and benefits were also discussed, alternatives, and possible competitions of the procedure including that of general anesthesia were also discussed.  We will await the PET scan before scheduling

## 2014-08-02 NOTE — Progress Notes (Signed)
Subjective:    Patient ID: Michaela Shaw, female    DOB: 10-21-1940, 74 y.o.   MRN: [id number]  HPI  74 year old nonsmoker with hypertension and diabetes presents for evaluation of abnormal CT chest. She underwent chest x-ray after a fall-that showed increased density in the right mid and lower lung. CT chest 01/09/14 showed T11 compression fracture, acute and multiple right-sided lung nodules with calcified hilar lymph nodes. MRI thoracic spine- confirmed benign appearing compression fracture of T11, no spinal cord compression. CT chest 07/24/58 showed stable calcified mediastinal and hilar lymph nodes. Right hilar mass and subcarinal lymphadenopathy he appeared larger -30x19 mm. Nodules in the right lung had enlarged-law just 30 mm. There were numerous small nodules in the left lung. Calcified granulomas were noted in the spleen.  She denies cough, wheezing, dyspnea or hemoptysis Diabetes appeared uncontrolled-320 on 07/26/14 but is better controlled in the last week  She has lost 30 pounds in the last 6 months-which she claims is intentional, with diet alone. She has lived in [place], and prior to that in [place].    Past Medical History  Diagnosis Date  . History of chicken pox   . Diabetes mellitus without complication   . Hyperlipidemia   . Hypertension   . Stroke 2011    "mild" per pt.   . Seizures 2005  . Type 2 diabetes mellitus with neurological manifestations, uncontrolled 03/05/2013   Past Surgical History  Procedure Laterality Date  . Bladder suspension  1980  . Abdominal hysterectomy  1980    partial  . Cholecystectomy  2007  . Appendectomy  2007  . Spine surgery  2012    Has rods and pins    Allergies  Allergen Reactions  . Metformin And Related Other (See Comments)    Kidney function    History   Social History  . Marital Status: Legally Separated    Spouse Name: N/A    Number of Children: N/A  . Years of Education: N/A   Occupational History  .  Not on file.   Social History Main Topics  . Smoking status: Never Smoker   . Smokeless tobacco: Never Used  . Alcohol Use: No  . Drug Use: No  . Sexual Activity: Not on file   Other Topics Concern  . 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 Heart disease Mother   . Diabetes Mother   . Alzheimer's disease Father   . Cancer Sister     colon  . Diabetes Sister   . Diabetes Daughter   . Diabetes Sister       Review of Systems  Constitutional: Negative for fever and unexpected weight change.  HENT: Negative for congestion, dental problem, ear pain, nosebleeds, postnasal drip, rhinorrhea, sinus pressure, sneezing, sore throat and trouble swallowing.   Eyes: Negative for redness and itching.  Respiratory: Positive for shortness of breath. Negative for cough, chest tightness and wheezing.   Cardiovascular: Negative for palpitations and leg swelling.  Gastrointestinal: Negative for nausea and vomiting.  Genitourinary: Negative for dysuria.  Musculoskeletal: Negative for joint swelling.  Skin: Negative for rash.  Neurological: Negative for headaches.  Hematological:  Does not bruise/bleed easily.  Psychiatric/Behavioral: Negative for dysphoric mood. The patient is not nervous/anxious.        Objective:   Physical Exam  Gen. Pleasant, well-nourished, in no distress, normal affect ENT - no lesions, no post nasal drip Neck: No JVD, no thyromegaly, no carotid bruits Lungs: no use of accessory muscles, no dullness to percussion, clear without rales or rhonchi  Cardiovascular: Rhythm regular, heart sounds  normal, no murmurs or gallops, no peripheral edema Abdomen: soft and non-tender, no hepatosplenomegaly, BS normal. Musculoskeletal: No deformities, no  cyanosis or clubbing Neuro:  alert, non focal       Assessment & Plan:

## 2014-08-02 NOTE — Patient Instructions (Signed)
PET scan We will schedule biopsy based on these results

## 2014-08-06 ENCOUNTER — Encounter (HOSPITAL_COMMUNITY): Payer: Self-pay

## 2014-08-06 ENCOUNTER — Ambulatory Visit (HOSPITAL_COMMUNITY)
Admission: RE | Admit: 2014-08-06 | Discharge: 2014-08-06 | Disposition: A | Discharge status: Home / Self Care | Payer: Medicare Other | Source: Ambulatory Visit | Attending: Family | Admitting: Family

## 2014-08-06 DIAGNOSIS — R918 Other nonspecific abnormal finding of lung field: Secondary | ICD-10-CM | POA: Diagnosis not present

## 2014-08-06 LAB — GLUCOSE, CAPILLARY: Glucose-Capillary: 91 mg/dL (ref 70–99)

## 2014-08-06 MED ORDER — FLUDEOXYGLUCOSE F - 18 (FDG) INJECTION: 7.2000 | Freq: Once | INTRAVENOUS | Status: AC | PRN

## 2014-08-08 ENCOUNTER — Other Ambulatory Visit: Payer: Self-pay | Admitting: Pulmonary Disease

## 2014-08-08 DIAGNOSIS — R918 Other nonspecific abnormal finding of lung field: Secondary | ICD-10-CM

## 2014-08-09 ENCOUNTER — Telehealth: Payer: Self-pay | Admitting: Pulmonary Disease

## 2014-08-09 NOTE — Telephone Encounter (Signed)
Spoke with Michaela Shaw at scheduling- Faxing PET scan report from Dr Kathlene Cote for Dr Elsworth Soho to review.  Need Dr Elsworth Soho to advise on next steps - Biopsy? Will hold in triage to wait for fax

## 2014-08-10 NOTE — Telephone Encounter (Signed)
Will forward to [place]. Report is in RA's look at.

## 2014-08-13 NOTE — Telephone Encounter (Signed)
Sent to Dr. Elsworth Soho

## 2014-08-13 NOTE — Telephone Encounter (Signed)
Elmo Putt is calling back [phone]

## 2014-08-14 NOTE — Telephone Encounter (Signed)
Spoke with pt and daughter, states they heard from the hospital this morning to schedule her pre-admitting date for pt's biopsy.  Pt had been told on 2/2 by RA when he relayed PET results that either he or radiology would be contacting her to let her know about the biopsy and what this involves.  Pt and daughter are wanting to know in more detail about the biopsy.  Dr. Elsworth Soho please advise. Thank you.

## 2014-08-14 NOTE — Telephone Encounter (Signed)
Pl let her know - after discussing with radiology, we decided to do bronchoscopy with iopsy under general anesthesia next Thursday afternoon - liquid before 6am - npo afterwards

## 2014-08-14 NOTE — Telephone Encounter (Signed)
Called and spoke to pt. Informed pt of the recs per RA. Pt aware of the time and date of bronch. Pt verbalized understanding and denied any further questions or concerns at this time.

## 2014-08-14 NOTE — Telephone Encounter (Signed)
Calling back [place]  [phone]

## 2014-08-16 ENCOUNTER — Other Ambulatory Visit: Payer: Self-pay | Admitting: Pulmonary Disease

## 2014-08-17 NOTE — Pre-Procedure Instructions (Signed)
Michaela Shaw  08/17/2014   Your procedure is scheduled on: Thursday, August 23, 2014 at 1:35 PM.  Report to [facility] Admitting at 11:35AM.  Call this number if you have problems the morning of surgery: [phone]              For any other questions, please call [phone], Monday - Friday 8 AM - 4 PM.  Remember:   Do not eat food or drink liquids after midnight.Wednesday, February 17.   Take these medicines the morning of surgery with A SIP OF WATER: amLODipine (NORVASC), cloNIDine (CATAPRES), omeprazole (PRILOSEC).   Do NOT take any diabetic medications the morning of your surgery   Do not wear jewelry, make-up or nail polish.  Do not wear lotions, powders, or perfumes.  Do not shave 48 hours prior to surgery.   Do not bring valuables to the hospital.             [facility] is not responsible  for any belongings or valuables.               Contacts, dentures or bridgework may not be worn into surgery.  Leave suitcase in the car. After surgery it may be brought to your room.  For patients admitted to the hospital, discharge time is determined by your treatment team.               Patients discharged the day of surgery will not be allowed to drive home.  Name and phone number of your driver:    Special Instructions: Shower using CHG soap the night before and the morning of your surgery   Please read over the following fact sheets that you were given: Pain Booklet, Coughing and Deep Breathing and Surgical Site Infection Prevention

## 2014-08-20 ENCOUNTER — Encounter (HOSPITAL_COMMUNITY)
Admission: RE | Admit: 2014-08-20 | Discharge: 2014-08-20 | Disposition: A | Discharge status: Home / Self Care | Payer: Medicare Other | Source: Ambulatory Visit | Attending: Internal Medicine | Admitting: Internal Medicine

## 2014-08-20 ENCOUNTER — Telehealth: Payer: Self-pay | Admitting: Pulmonary Disease

## 2014-08-20 ENCOUNTER — Encounter (HOSPITAL_COMMUNITY): Payer: Self-pay

## 2014-08-20 DIAGNOSIS — K219 Gastro-esophageal reflux disease without esophagitis: Secondary | ICD-10-CM | POA: Diagnosis not present

## 2014-08-20 DIAGNOSIS — R591 Generalized enlarged lymph nodes: Secondary | ICD-10-CM | POA: Diagnosis present

## 2014-08-20 DIAGNOSIS — Z794 Long term (current) use of insulin: Secondary | ICD-10-CM | POA: Diagnosis not present

## 2014-08-20 DIAGNOSIS — E1140 Type 2 diabetes mellitus with diabetic neuropathy, unspecified: Secondary | ICD-10-CM | POA: Diagnosis not present

## 2014-08-20 DIAGNOSIS — I129 Hypertensive chronic kidney disease with stage 1 through stage 4 chronic kidney disease, or unspecified chronic kidney disease: Secondary | ICD-10-CM | POA: Diagnosis not present

## 2014-08-20 DIAGNOSIS — N189 Chronic kidney disease, unspecified: Secondary | ICD-10-CM | POA: Diagnosis not present

## 2014-08-20 DIAGNOSIS — E785 Hyperlipidemia, unspecified: Secondary | ICD-10-CM | POA: Diagnosis not present

## 2014-08-20 DIAGNOSIS — Z8673 Personal history of transient ischemic attack (TIA), and cerebral infarction without residual deficits: Secondary | ICD-10-CM | POA: Diagnosis not present

## 2014-08-20 DIAGNOSIS — Z888 Allergy status to other drugs, medicaments and biological substances status: Secondary | ICD-10-CM | POA: Diagnosis not present

## 2014-08-20 HISTORY — DX: Gastro-esophageal reflux disease without esophagitis: K21.9

## 2014-08-20 HISTORY — DX: Chronic kidney disease, unspecified: N18.9

## 2014-08-20 HISTORY — DX: Other specified soft tissue disorders: M79.89

## 2014-08-20 HISTORY — DX: Unspecified disorder of circulatory system: I99.9

## 2014-08-20 LAB — BASIC METABOLIC PANEL
Anion gap: 7 (ref 5–15)
BUN: 17 mg/dL (ref 6–23)
CO2: 26 mmol/L (ref 19–32)
Calcium: 9.4 mg/dL (ref 8.4–10.5)
Chloride: 103 mmol/L (ref 96–112)
Creatinine, Ser: 1.73 mg/dL — ABNORMAL HIGH (ref 0.50–1.10)
GFR calc Af Amer: 32 mL/min — ABNORMAL LOW (ref >90)
GFR calc non Af Amer: 28 mL/min — ABNORMAL LOW (ref >90)
Glucose, Bld: 195 mg/dL — ABNORMAL HIGH (ref 70–99)
Potassium: 4.1 mmol/L (ref 3.5–5.1)
SODIUM: 136 mmol/L (ref 135–145)

## 2014-08-20 LAB — CBC
HCT: 30.4 % — ABNORMAL LOW (ref 36.0–46.0)
Hemoglobin: 9.9 g/dL — ABNORMAL LOW (ref 12.0–15.0)
MCH: 26.3 pg (ref 26.0–34.0)
MCHC: 32.6 g/dL (ref 30.0–36.0)
MCV: 80.9 fL (ref 78.0–100.0)
Platelets: 500 10*3/uL — ABNORMAL HIGH (ref 150–400)
RBC: 3.76 MIL/uL — AB (ref 3.87–5.11)
RDW: 14.4 % (ref 11.5–15.5)
WBC: 9.2 10*3/uL (ref 4.0–10.5)

## 2014-08-20 NOTE — Progress Notes (Signed)
PCP is Debbrah Alar. Patient denied having any acute cardiac or pulmonary issues.   Nurse asked patient about instructions about stopping aspirin and pletal, and patient stated she was not informed to stop either. Nurse then called Dr. Angus Palms office [phone]), however due to inclement weather the office will not be open until 10 AM. Will inform patient of this and call the office at a later time.

## 2014-08-20 NOTE — Progress Notes (Signed)
Nurse called [facility] and left a message with Dr. Bari Mantis Nurse to return call ASAP regarding if patient needed to stop taking Aspirin and Pletal before surgery. Direct call back number left with receptionist.

## 2014-08-20 NOTE — Telephone Encounter (Signed)
STOP from today

## 2014-08-20 NOTE — Telephone Encounter (Signed)
Spoke with Michaela Shaw at [facility] -- pt scheduled for EBUS 08/23/14 Pt taking ASA 81 and Pletal daily. Michaela Shaw states that it is recommended to stop both about 5-7 days prior to surgery/procedures.  Please advise if patient needs to stop these. Thanks.   RA-please advise. Thanks.

## 2014-08-20 NOTE — Telephone Encounter (Signed)
Patient notified.  No questions or concerns at this time. Nothing further needed.   

## 2014-08-20 NOTE — Progress Notes (Signed)
Nurse did not receive a call back from Dr. Bari Mantis office about if patient should stop aspirin and Pletal, so Nurse called patient to inform patient of this. Patient stated that Dr. Bari Mantis office contacted her approximately a half hour ago and told her to stop taking aspirin and Pletal. Patient thanked Nurse for calling. Call ended.

## 2014-08-21 ENCOUNTER — Encounter (HOSPITAL_COMMUNITY): Payer: Self-pay

## 2014-08-21 NOTE — Progress Notes (Signed)
Anesthesia Chart Review:  Patient is a 74 year old female scheduled for bronchoscopy with endobronchial ultrasound and biopsy on 08/23/14 by Dr. Elsworth Soho.   History includes non-smoker, DM2, HLD, HTN, seizures '05, CVA '11, GERD, PAD (RLE), spinal surgery, cholecystectomy, hysterectomy. CKD was not reported, but Cr has been 1.56 - 2.50 since 12/2013. PCP is Debbrah Alar, FNP. Endocrinologist is Dr. Cruzita Lederer. Vascular surgeon is Dr. Trula Slade. PCP notes from 06/08/14 indicate that she sees nephrologist Dr. Florene Glen (history updated to reflect CKD).   Meds include ASA and Pletal (on hold 08/20/14), amlodipine, clonidine, Tricor, 65 Fe, Humalog, NPH, Prilosec, KCl, Crestor.  08/20/14 EKG: ST at 103 bpm.  07/15/14 Chest CT: IMPRESSION: Findings worrisome for right lung neoplasm with progressive right hilar and mediastinal lymphadenopathy, progressive tumor in the right upper lobe and right middle lobe and new pulmonary metastatic nodules. Recommend PET-CT and bronchoscopic biopsy for further evaluation.  Preoperative labs noted. Cr 1.73, which appears within there baseline range (three prior Cr were 1.56 - 1.8 since 06/2014). H/H 9.9/30.4, up from 9.2/28.0 (highest HGB in Epic since 09/01/10 was 10.4).  PLT 500K, unchanged from 07/16/14. Glucose 195.   I think her labs are stable.  HGB appears at baseline and is just below 10.  For this procedure, I'll defer decision for T&S to her surgeon and/or anesthesiologist.    Myra Gianotti, PA-C [facility] Short Stay Center/Anesthesiology Phone [phone] 08/21/2014 4:52 PM

## 2014-08-23 ENCOUNTER — Encounter (HOSPITAL_COMMUNITY)
Admission: RE | Disposition: A | Discharge status: Home / Self Care | Payer: Self-pay | Source: Ambulatory Visit | Attending: Pulmonary Disease

## 2014-08-23 ENCOUNTER — Encounter (HOSPITAL_COMMUNITY): Payer: Self-pay | Admitting: *Deleted

## 2014-08-23 ENCOUNTER — Ambulatory Visit (HOSPITAL_COMMUNITY): Payer: Medicare Other | Admitting: Vascular Surgery

## 2014-08-23 ENCOUNTER — Ambulatory Visit (HOSPITAL_COMMUNITY): Payer: Medicare Other

## 2014-08-23 ENCOUNTER — Ambulatory Visit (HOSPITAL_COMMUNITY)
Admission: RE | Admit: 2014-08-23 | Discharge: 2014-08-23 | Disposition: A | Discharge status: Home / Self Care | Payer: Medicare Other | Source: Ambulatory Visit | Attending: Pulmonary Disease | Admitting: Pulmonary Disease

## 2014-08-23 ENCOUNTER — Ambulatory Visit (HOSPITAL_COMMUNITY): Payer: Medicare Other | Admitting: Anesthesiology

## 2014-08-23 DIAGNOSIS — R918 Other nonspecific abnormal finding of lung field: Secondary | ICD-10-CM

## 2014-08-23 DIAGNOSIS — K219 Gastro-esophageal reflux disease without esophagitis: Secondary | ICD-10-CM | POA: Diagnosis not present

## 2014-08-23 DIAGNOSIS — Z9889 Other specified postprocedural states: Secondary | ICD-10-CM

## 2014-08-23 DIAGNOSIS — R590 Localized enlarged lymph nodes: Secondary | ICD-10-CM | POA: Insufficient documentation

## 2014-08-23 DIAGNOSIS — N189 Chronic kidney disease, unspecified: Secondary | ICD-10-CM | POA: Insufficient documentation

## 2014-08-23 DIAGNOSIS — Z794 Long term (current) use of insulin: Secondary | ICD-10-CM | POA: Insufficient documentation

## 2014-08-23 DIAGNOSIS — Z888 Allergy status to other drugs, medicaments and biological substances status: Secondary | ICD-10-CM | POA: Insufficient documentation

## 2014-08-23 DIAGNOSIS — Z8673 Personal history of transient ischemic attack (TIA), and cerebral infarction without residual deficits: Secondary | ICD-10-CM | POA: Insufficient documentation

## 2014-08-23 DIAGNOSIS — E785 Hyperlipidemia, unspecified: Secondary | ICD-10-CM | POA: Diagnosis not present

## 2014-08-23 DIAGNOSIS — R599 Enlarged lymph nodes, unspecified: Secondary | ICD-10-CM

## 2014-08-23 DIAGNOSIS — R591 Generalized enlarged lymph nodes: Secondary | ICD-10-CM | POA: Insufficient documentation

## 2014-08-23 DIAGNOSIS — I129 Hypertensive chronic kidney disease with stage 1 through stage 4 chronic kidney disease, or unspecified chronic kidney disease: Secondary | ICD-10-CM | POA: Insufficient documentation

## 2014-08-23 DIAGNOSIS — E1140 Type 2 diabetes mellitus with diabetic neuropathy, unspecified: Secondary | ICD-10-CM | POA: Diagnosis not present

## 2014-08-23 HISTORY — PX: VIDEO BRONCHOSCOPY WITH ENDOBRONCHIAL ULTRASOUND: SHX6177

## 2014-08-23 LAB — GLUCOSE, CAPILLARY
GLUCOSE-CAPILLARY: 110 mg/dL — AB (ref 70–99)
Glucose-Capillary: 125 mg/dL — ABNORMAL HIGH (ref 70–99)
Glucose-Capillary: 118 mg/dL — ABNORMAL HIGH (ref 70–99)

## 2014-08-23 SURGERY — BRONCHOSCOPY, WITH EBUS
Anesthesia: General | Site: Bronchus

## 2014-08-23 MED ORDER — ONDANSETRON HCL 4 MG/2ML IJ SOLN
INTRAMUSCULAR | Status: DC | PRN
  Administered 2014-08-23: 4 mg via INTRAVENOUS

## 2014-08-23 MED ORDER — ONDANSETRON HCL 4 MG/2ML IJ SOLN
INTRAMUSCULAR | Status: AC
  Filled 2014-08-23: qty 6

## 2014-08-23 MED ORDER — ROCURONIUM BROMIDE 50 MG/5ML IV SOLN
INTRAVENOUS | Status: AC
  Filled 2014-08-23: qty 2

## 2014-08-23 MED ORDER — EPHEDRINE SULFATE 50 MG/ML IJ SOLN
INTRAMUSCULAR | Status: AC
  Filled 2014-08-23: qty 2

## 2014-08-23 MED ORDER — PROPOFOL 10 MG/ML IV BOLUS
INTRAVENOUS | Status: DC | PRN
  Administered 2014-08-23: 100 mg via INTRAVENOUS

## 2014-08-23 MED ORDER — SUCCINYLCHOLINE CHLORIDE 20 MG/ML IJ SOLN
INTRAMUSCULAR | Status: AC
  Filled 2014-08-23: qty 2

## 2014-08-23 MED ORDER — FENTANYL CITRATE 0.05 MG/ML IJ SOLN: 25.0000 ug | INTRAMUSCULAR | Status: DC | PRN

## 2014-08-23 MED ORDER — OXYCODONE HCL 5 MG/5ML PO SOLN: 5.0000 mg | Freq: Once | ORAL | Status: DC | PRN

## 2014-08-23 MED ORDER — FENTANYL CITRATE 0.05 MG/ML IJ SOLN
INTRAMUSCULAR | Status: DC | PRN
  Administered 2014-08-23 (×4): 50 ug via INTRAVENOUS

## 2014-08-23 MED ORDER — CISATRACURIUM BESYLATE (PF) 10 MG/5ML IV SOLN
INTRAVENOUS | Status: DC | PRN
  Administered 2014-08-23: 10 mg via INTRAVENOUS

## 2014-08-23 MED ORDER — GLYCOPYRROLATE 0.2 MG/ML IJ SOLN
INTRAMUSCULAR | Status: DC | PRN
  Administered 2014-08-23: .4 mg via INTRAVENOUS

## 2014-08-23 MED ORDER — 0.9 % SODIUM CHLORIDE (POUR BTL) OPTIME
TOPICAL | Status: DC | PRN
  Administered 2014-08-23: 1000 mL

## 2014-08-23 MED ORDER — CISATRACURIUM BESYLATE 20 MG/10ML IV SOLN
INTRAVENOUS | Status: AC
  Filled 2014-08-23: qty 10

## 2014-08-23 MED ORDER — SODIUM CHLORIDE 0.9 % IV SOLN
INTRAVENOUS | Status: DC
  Administered 2014-08-23 (×3): via INTRAVENOUS

## 2014-08-23 MED ORDER — NEOSTIGMINE METHYLSULFATE 10 MG/10ML IV SOLN
INTRAVENOUS | Status: DC | PRN
  Administered 2014-08-23: 3 mg via INTRAVENOUS

## 2014-08-23 MED ORDER — NEOSTIGMINE METHYLSULFATE 10 MG/10ML IV SOLN
INTRAVENOUS | Status: AC
  Filled 2014-08-23: qty 1

## 2014-08-23 MED ORDER — OXYCODONE HCL 5 MG PO TABS: 5.0000 mg | ORAL_TABLET | Freq: Once | ORAL | Status: DC | PRN

## 2014-08-23 MED ORDER — LIDOCAINE HCL (CARDIAC) 20 MG/ML IV SOLN
INTRAVENOUS | Status: AC
  Filled 2014-08-23: qty 15

## 2014-08-23 MED ORDER — FENTANYL CITRATE 0.05 MG/ML IJ SOLN
INTRAMUSCULAR | Status: AC
  Filled 2014-08-23: qty 5

## 2014-08-23 MED ORDER — ONDANSETRON HCL 4 MG/2ML IJ SOLN: 4.0000 mg | Freq: Once | INTRAMUSCULAR | Status: DC | PRN

## 2014-08-23 MED ORDER — LIDOCAINE HCL (CARDIAC) 20 MG/ML IV SOLN
INTRAVENOUS | Status: DC | PRN
  Administered 2014-08-23: 30 mg via INTRAVENOUS

## 2014-08-23 MED ORDER — GLYCOPYRROLATE 0.2 MG/ML IJ SOLN
INTRAMUSCULAR | Status: AC
  Filled 2014-08-23: qty 10

## 2014-08-23 SURGICAL SUPPLY — 22 items
BRUSH CYTOL CELLEBRITY 1.5X140 (MISCELLANEOUS) ×3 IMPLANT
CANISTER SUCTION 2500CC (MISCELLANEOUS) ×3 IMPLANT
CONT SPEC 4OZ CLIKSEAL STRL BL (MISCELLANEOUS) ×3 IMPLANT
COVER TABLE BACK 60X90 (DRAPES) ×3 IMPLANT
FORCEPS BIOP RJ4 1.8 (CUTTING FORCEPS) ×3 IMPLANT
GAUZE SPONGE 4X4 12PLY STRL (GAUZE/BANDAGES/DRESSINGS) IMPLANT
GLOVE SURG SIGNA 7.5 PF LTX (GLOVE) ×3 IMPLANT
KIT CLEAN ENDO COMPLIANCE (KITS) ×6 IMPLANT
KIT ROOM TURNOVER OR (KITS) ×3 IMPLANT
MARKER SKIN DUAL TIP RULER LAB (MISCELLANEOUS) ×3 IMPLANT
NEEDLE BIOPSY TRANSBRONCH 21G (NEEDLE) IMPLANT
NEEDLE SYS SONOTIP II EBUSTBNA (NEEDLE) IMPLANT
NS IRRIG 1000ML POUR BTL (IV SOLUTION) ×3 IMPLANT
OIL SILICONE PENTAX (PARTS (SERVICE/REPAIRS)) ×3 IMPLANT
PAD ARMBOARD 7.5X6 YLW CONV (MISCELLANEOUS) ×6 IMPLANT
SYR 20CC LL (SYRINGE) ×3 IMPLANT
SYR 20ML ECCENTRIC (SYRINGE) ×6 IMPLANT
SYR 5ML LUER SLIP (SYRINGE) ×3 IMPLANT
TOWEL OR 17X24 6PK STRL BLUE (TOWEL DISPOSABLE) ×3 IMPLANT
TRAP SPECIMEN MUCOUS 40CC (MISCELLANEOUS) IMPLANT
TUBE CONNECTING 20'X1/4 (TUBING) ×1
TUBE CONNECTING 20X1/4 (TUBING) ×2 IMPLANT

## 2014-08-23 NOTE — Discharge Instructions (Signed)
Flexible Bronchoscopy, Care After These instructions give you information on caring for yourself after your procedure. Your doctor may also give you more specific instructions. Call your doctor if you have any problems or questions after your procedure. HOME CARE  Do not eat or drink anything for 2 hours after your procedure. If you try to eat or drink before the medicine wears off, food or drink could go into your lungs. You could also burn yourself.  After 2 hours have passed and when you can cough and gag normally, you may eat soft food and drink liquids slowly.  The day after the test, you may eat your normal diet.  You may do your normal activities.  Keep all doctor visits. GET HELP RIGHT AWAY IF:  You get more and more short of breath.  You get light-headed.  You feel like you are going to pass out (faint).  You have chest pain.  You have new problems that worry you.  You cough up more than a little blood.  You cough up more blood than before. MAKE SURE YOU:  Understand these instructions.  Will watch your condition.  Will get help right away if you are not doing well or get worse. Document Released: 04/19/2009 Document Revised: 06/27/2013 Document Reviewed: 02/24/2013 [facility] Patient Information 2015 [place], [place]. This information is not intended to replace advice given to you by your health care provider. Make sure you discuss any questions you have with your health care provider. General Anesthesia, Care After Refer to this sheet in the next few weeks. These instructions provide you with information on caring for yourself after your procedure. Your health care provider may also give you more specific instructions. Your treatment has been planned according to current medical practices, but problems sometimes occur. Call your health care provider if you have any problems or questions after your procedure. WHAT TO EXPECT AFTER THE PROCEDURE After the procedure, it is  typical to experience:  Sleepiness.  Nausea and vomiting. HOME CARE INSTRUCTIONS  For the first 24 hours after general anesthesia:  Have a responsible person with you.  Do not drive a car. If you are alone, do not take public transportation.  Do not drink alcohol.  Do not take medicine that has not been prescribed by your health care provider.  Do not sign important papers or make important decisions.  You may resume a normal diet and activities as directed by your health care provider.  Change bandages (dressings) as directed.  If you have questions or problems that seem related to general anesthesia, call the hospital and ask for the anesthetist or anesthesiologist on call. SEEK MEDICAL CARE IF:  You have nausea and vomiting that continue the day after anesthesia.  You develop a rash. SEEK IMMEDIATE MEDICAL CARE IF:   You have difficulty breathing.  You have chest pain.  You have any allergic problems. Document Released: 09/28/2000 Document Revised: 06/27/2013 Document Reviewed: 01/05/2013 [facility] Patient Information 2015 [place], [place]. This information is not intended to replace advice given to you by your health care provider. Make sure you discuss any questions you have with your health care provider.

## 2014-08-23 NOTE — H&P (View-Only) (Signed)
Subjective:    Patient ID: Michaela Shaw, female    DOB: 06/02/1941, 74 y.o.   MRN: [id number]  HPI  73 year old nonsmoker with hypertension and diabetes presents for evaluation of abnormal CT chest. She underwent chest x-ray after a fall-that showed increased density in the right mid and lower lung. CT chest 01/09/14 showed T11 compression fracture, acute and multiple right-sided lung nodules with calcified hilar lymph nodes. MRI thoracic spine- confirmed benign appearing compression fracture of T11, no spinal cord compression. CT chest 07/24/58 showed stable calcified mediastinal and hilar lymph nodes. Right hilar mass and subcarinal lymphadenopathy he appeared larger -30x19 mm. Nodules in the right lung had enlarged-law just 30 mm. There were numerous small nodules in the left lung. Calcified granulomas were noted in the spleen.  She denies cough, wheezing, dyspnea or hemoptysis Diabetes appeared uncontrolled-320 on 07/26/14 but is better controlled in the last week  She has lost 30 pounds in the last 6 months-which she claims is intentional, with diet alone. She has lived in [place], and prior to that in [place].    Past Medical History  Diagnosis Date  . History of chicken pox   . Diabetes mellitus without complication   . Hyperlipidemia   . Hypertension   . Stroke 2011    "mild" per pt.   . Seizures 2005  . Type 2 diabetes mellitus with neurological manifestations, uncontrolled 03/05/2013   Past Surgical History  Procedure Laterality Date  . Bladder suspension  1980  . Abdominal hysterectomy  1980    partial  . Cholecystectomy  2007  . Appendectomy  2007  . Spine surgery  2012    Has rods and pins    Allergies  Allergen Reactions  . Metformin And Related Other (See Comments)    Kidney function    History   Social History  . Marital Status: Legally Separated    Spouse Name: N/A    Number of Children: N/A  . Years of Education: N/A   Occupational History  .  Not on file.   Social History Main Topics  . Smoking status: Never Smoker   . Smokeless tobacco: Never Used  . Alcohol Use: No  . Drug Use: No  . Sexual Activity: Not on file   Other Topics Concern  . 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 Heart disease Mother   . Diabetes Mother   . Alzheimer's disease Father   . Cancer Sister     colon  . Diabetes Sister   . Diabetes Daughter   . Diabetes Sister       Review of Systems  Constitutional: Negative for fever and unexpected weight change.  HENT: Negative for congestion, dental problem, ear pain, nosebleeds, postnasal drip, rhinorrhea, sinus pressure, sneezing, sore throat and trouble swallowing.   Eyes: Negative for redness and itching.  Respiratory: Positive for shortness of breath. Negative for cough, chest tightness and wheezing.   Cardiovascular: Negative for palpitations and leg swelling.  Gastrointestinal: Negative for nausea and vomiting.  Genitourinary: Negative for dysuria.  Musculoskeletal: Negative for joint swelling.  Skin: Negative for rash.  Neurological: Negative for headaches.  Hematological:  Does not bruise/bleed easily.  Psychiatric/Behavioral: Negative for dysphoric mood. The patient is not nervous/anxious.        Objective:   Physical Exam  Gen. Pleasant, well-nourished, in no distress, normal affect ENT - no lesions, no post nasal drip Neck: No JVD, no thyromegaly, no carotid bruits Lungs: no use of accessory muscles, no dullness to percussion, clear without rales or rhonchi  Cardiovascular: Rhythm regular, heart sounds  normal, no murmurs or gallops, no peripheral edema Abdomen: soft and non-tender, no hepatosplenomegaly, BS normal. Musculoskeletal: No deformities, no  cyanosis or clubbing Neuro:  alert, non focal       Assessment & Plan:

## 2014-08-23 NOTE — Anesthesia Preprocedure Evaluation (Signed)
Anesthesia Evaluation  Patient identified by MRN, date of birth, ID band Patient awake    Reviewed: Allergy & Precautions, NPO status , Patient's Chart, lab work & pertinent test results  Airway Mallampati: II  TM Distance: >3 FB Neck ROM: Full    Dental  (+) Edentulous Upper, Edentulous Lower   Pulmonary  breath sounds clear to auscultation        Cardiovascular hypertension, Rhythm:Regular Rate:Normal     Neuro/Psych    GI/Hepatic   Endo/Other  diabetes  Renal/GU      Musculoskeletal   Abdominal   Peds  Hematology   Anesthesia Other Findings   Reproductive/Obstetrics                             Anesthesia Physical Anesthesia Plan  ASA: III  Anesthesia Plan: General   Post-op Pain Management:    Induction: Intravenous  Airway Management Planned: Oral ETT  Additional Equipment:   Intra-op Plan:   Post-operative Plan:   Informed Consent: I have reviewed the patients History and Physical, chart, labs and discussed the procedure including the risks, benefits and alternatives for the proposed anesthesia with the patient or authorized representative who has indicated his/her understanding and acceptance.     Plan Discussed with: CRNA and Anesthesiologist  Anesthesia Plan Comments:         Anesthesia Quick Evaluation

## 2014-08-23 NOTE — Anesthesia Procedure Notes (Signed)
Procedure Name: Intubation Date/Time: 08/23/2014 2:43 PM Performed by: Clearnce Sorrel Pre-anesthesia Checklist: Patient identified, Timeout performed, Emergency Drugs available, Suction available and Patient being monitored Patient Re-evaluated:Patient Re-evaluated prior to inductionOxygen Delivery Method: Circle system utilized Preoxygenation: Pre-oxygenation with 100% oxygen Intubation Type: IV induction Ventilation: Mask ventilation without difficulty Laryngoscope Size: Mac and 3 Grade View: Grade I Tube type: Oral Tube size: 8.5 mm Number of attempts: 1 Placement Confirmation: ETT inserted through vocal cords under direct vision,  breath sounds checked- equal and bilateral and positive ETCO2 Secured at: 22 cm Dental Injury: Teeth and Oropharynx as per pre-operative assessment

## 2014-08-23 NOTE — Anesthesia Postprocedure Evaluation (Signed)
  Anesthesia Post-op Note  Patient: Michaela Shaw  Procedure(s) Performed: Procedure(s): VIDEO BRONCHOSCOPY WITH ENDOBRONCHIAL ULTRASOUND with Biopsy (N/A)  Patient Location: PACU  Anesthesia Type:General  Level of Consciousness: awake, alert  and oriented  Airway and Oxygen Therapy: Patient Spontanous Breathing and Patient connected to nasal cannula oxygen  Post-op Pain: none  Post-op Assessment: Post-op Vital signs reviewed, Patient's Cardiovascular Status Stable, Respiratory Function Stable, Patent Airway and No signs of Nausea or vomiting  Post-op Vital Signs: stable  Last Vitals:  Filed Vitals:   08/23/14 1745  BP: 124/58  Pulse: 70  Temp: 36.7 C  Resp: 31    Complications: No apparent anesthesia complications

## 2014-08-23 NOTE — Op Note (Signed)
  Name:  Michaela Shaw MRN:  [id number] DOB:  [birth year]  PROCEDURE NOTE  Procedure(s): Flexible bronchoscopy [phone]) Brushing [phone]) of the RLL & RML Bronchial alveolar lavage (50388) of the RLL & RML Endobronchial biopsy (82800) of the RLL Endobronchial ultrasound (34917) Transbronchial needle aspiration (91505) of the subcarinal -station 7 Transbronchial needle aspiration, additional lobe (69794) of the right hilar -10R  Indications:  Hilar / mediastinal lymphadenopathy.  Consent:  Written informed consent was obtained prior to the procedure. The risks of the procedure including coughing, bleeding and the small chance of lung puncture requiring chest tube were discussed in great detail. The benefits & alternatives including serial follow up were also discussed.  Anesthesia:  General endotracheal.  Procedure summary:  Appropriate equipment was assembled.  The patient was  identified as Michaela Shaw. Interim history obtained and brought to the operating room. Safety timeout was performed. The patient was placed supine on the operating table, airway established and general anesthesia administered by Anesthesia team.   After the appropriate level of anesthesia was assured, flexible video bronchoscope was lubricated and inserted through the endotracheal tube.    Airway examination was performed bilaterally to subsegmental level.  Minimal clear secretions were noted, mucosa appeared normal and no endobronchial lesions were identified.The bronchial mucosa on right appeared nodular & bronchial orifices were narrowed down.  Endobronchial ultrasound video bronchoscope was then lubricated and inserted through the endotracheal tube. Surveillance of the mediastinal and and bilateral hilar lymph node stations was performed.  Pathologically enlarged lymph nodes were noted at stations 7 & 10R  Endobronchial ultrasound guided transbronchial needle aspiration of station 10 (passes 3), station 10R   (passes 3) was performed, after which EBUS bronchoscope was withdrawn.  Flexible video bronchoscope was used again to perform random endobronchial mucosal biopsies, brushings & BAL  After ensuring hemostasis , the bronchoscope was withdrawn.  The patient was extubated in operating room and transferred to PACU. Post-procedure chest x-ray was ordered.  Specimens sent: Bronchial alveolar lavage specimen of the RLL for  microbiology and cytology. Brushings & needle aspiration for cytology Biopsies for path  Complications:  No immediate complications were noted.  Hemodynamic parameters and oxygenation remained stable throughout the procedure.  Estimated blood loss:  Less then 5 mL.   Kara Mead MD. [place]. [facility] Pager [phone] If no response call [phone]   08/23/2014 3:39 PM

## 2014-08-23 NOTE — Transfer of Care (Signed)
Immediate Anesthesia Transfer of Care Note  Patient: Michaela Shaw  Procedure(s) Performed: Procedure(s): VIDEO BRONCHOSCOPY WITH ENDOBRONCHIAL ULTRASOUND with Biopsy (N/A)  Patient Location: PACU  Anesthesia Type:General  Level of Consciousness: awake, alert  and oriented  Airway & Oxygen Therapy: Patient Spontanous Breathing and Patient connected to face mask oxygen  Post-op Assessment: Report given to RN and Post -op Vital signs reviewed and stable  Post vital signs: Reviewed and stable  Last Vitals:  Filed Vitals:   08/23/14 1148  BP: 161/59  Pulse: 78  Temp: 36.9 C  Resp: 20    Complications: No apparent anesthesia complications

## 2014-08-23 NOTE — Interval H&P Note (Signed)
Michaela Shaw has presented today for procedure, with the diagnosis of mediastinal & hilar lymphadenopathy. The various methods of treatment have been discussed with the patient and family. After consideration of risks, benefits and other options for treatment, the patient has consented to Procedure(s) :  EBUS with biopsy .  The patient's history has been reviewed, patient examined, no change in status, stable for surgery. I have reviewed the patient's chart and labs. Questions were answered to the patient's satisfaction

## 2014-08-25 ENCOUNTER — Encounter (HOSPITAL_COMMUNITY): Payer: Self-pay | Admitting: Pulmonary Disease

## 2014-08-27 ENCOUNTER — Telehealth: Payer: Self-pay | Admitting: *Deleted

## 2014-08-27 ENCOUNTER — Other Ambulatory Visit: Payer: Self-pay | Admitting: *Deleted

## 2014-08-27 DIAGNOSIS — R918 Other nonspecific abnormal finding of lung field: Secondary | ICD-10-CM

## 2014-08-27 NOTE — Telephone Encounter (Signed)
Called to set up appt for thoracic clinic on 09/06/14 arrival at 1:30.  Daughter verbalized understanding of appt time and place

## 2014-09-04 ENCOUNTER — Telehealth: Payer: Self-pay | Admitting: *Deleted

## 2014-09-04 ENCOUNTER — Ambulatory Visit: Payer: Medicare Other | Admitting: Internal Medicine

## 2014-09-04 NOTE — Telephone Encounter (Signed)
Received request from Michaela Shaw to call pt and remind her of her appt.  Called and left a message w/ appt date and arrival time and stated to call w/ any questions.

## 2014-09-06 ENCOUNTER — Encounter: Payer: Self-pay | Admitting: *Deleted

## 2014-09-06 ENCOUNTER — Ambulatory Visit (HOSPITAL_BASED_OUTPATIENT_CLINIC_OR_DEPARTMENT_OTHER): Payer: Medicare Other | Admitting: Internal Medicine

## 2014-09-06 ENCOUNTER — Encounter: Payer: Self-pay | Admitting: Internal Medicine

## 2014-09-06 ENCOUNTER — Other Ambulatory Visit (HOSPITAL_BASED_OUTPATIENT_CLINIC_OR_DEPARTMENT_OTHER): Payer: Medicare Other

## 2014-09-06 ENCOUNTER — Ambulatory Visit
Admission: RE | Admit: 2014-09-06 | Discharge: 2014-09-06 | Disposition: A | Discharge status: Home / Self Care | Payer: Medicare Other | Source: Ambulatory Visit | Attending: Radiation Oncology | Admitting: Radiation Oncology

## 2014-09-06 ENCOUNTER — Ambulatory Visit: Payer: Medicare Other | Attending: Internal Medicine | Admitting: Physical Therapy

## 2014-09-06 ENCOUNTER — Ambulatory Visit: Admission: RE | Admit: 2014-09-06 | Payer: Medicare Other | Source: Ambulatory Visit | Admitting: Radiation Oncology

## 2014-09-06 ENCOUNTER — Ambulatory Visit (HOSPITAL_BASED_OUTPATIENT_CLINIC_OR_DEPARTMENT_OTHER): Payer: Medicare Other

## 2014-09-06 ENCOUNTER — Telehealth: Payer: Self-pay | Admitting: Internal Medicine

## 2014-09-06 VITALS — BP 164/67 | HR 107 | Temp 98.3°F | Resp 18 | Ht 59.0 in | Wt 130.4 lb

## 2014-09-06 DIAGNOSIS — C3491 Malignant neoplasm of unspecified part of right bronchus or lung: Secondary | ICD-10-CM

## 2014-09-06 DIAGNOSIS — R5381 Other malaise: Secondary | ICD-10-CM | POA: Insufficient documentation

## 2014-09-06 DIAGNOSIS — R2681 Unsteadiness on feet: Secondary | ICD-10-CM | POA: Insufficient documentation

## 2014-09-06 DIAGNOSIS — C7970 Secondary malignant neoplasm of unspecified adrenal gland: Secondary | ICD-10-CM

## 2014-09-06 DIAGNOSIS — C7951 Secondary malignant neoplasm of bone: Secondary | ICD-10-CM

## 2014-09-06 DIAGNOSIS — R918 Other nonspecific abnormal finding of lung field: Secondary | ICD-10-CM

## 2014-09-06 DIAGNOSIS — C3490 Malignant neoplasm of unspecified part of unspecified bronchus or lung: Secondary | ICD-10-CM | POA: Insufficient documentation

## 2014-09-06 LAB — CBC WITH DIFFERENTIAL/PLATELET
BASO%: 0.9 % (ref 0.0–2.0)
Basophils Absolute: 0.1 10*3/uL (ref 0.0–0.1)
EOS%: 2 % (ref 0.0–7.0)
Eosinophils Absolute: 0.2 10*3/uL (ref 0.0–0.5)
HCT: 30.5 % — ABNORMAL LOW (ref 34.8–46.6)
HGB: 9.9 g/dL — ABNORMAL LOW (ref 11.6–15.9)
LYMPH%: 22.4 % (ref 14.0–49.7)
MCH: 27.3 pg (ref 25.1–34.0)
MCHC: 32.5 g/dL (ref 31.5–36.0)
MCV: 84 fL (ref 79.5–101.0)
MONO#: 0.6 10*3/uL (ref 0.1–0.9)
MONO%: 6.6 % (ref 0.0–14.0)
NEUT#: 6.1 10*3/uL (ref 1.5–6.5)
NEUT%: 68.1 % (ref 38.4–76.8)
Platelets: 514 10*3/uL — ABNORMAL HIGH (ref 145–400)
RBC: 3.63 10*6/uL — ABNORMAL LOW (ref 3.70–5.45)
RDW: 14.4 % (ref 11.2–14.5)
WBC: 9 10*3/uL (ref 3.9–10.3)
lymph#: 2 10*3/uL (ref 0.9–3.3)

## 2014-09-06 LAB — COMPREHENSIVE METABOLIC PANEL (CC13)
ALK PHOS: 65 U/L (ref 40–150)
ALT: 8 U/L (ref 0–55)
AST: 15 U/L (ref 5–34)
Albumin: 3.4 g/dL — ABNORMAL LOW (ref 3.5–5.0)
Anion Gap: 11 mEq/L (ref 3–11)
BILIRUBIN TOTAL: 0.21 mg/dL (ref 0.20–1.20)
BUN: 18.7 mg/dL (ref 7.0–26.0)
CO2: 24 mEq/L (ref 22–29)
CREATININE: 1.5 mg/dL — AB (ref 0.6–1.1)
Calcium: 9.4 mg/dL (ref 8.4–10.4)
Chloride: 103 mEq/L (ref 98–109)
EGFR: 33 mL/min/{1.73_m2} — ABNORMAL LOW (ref >90)
GLUCOSE: 204 mg/dL — AB (ref 70–140)
Potassium: 4 mEq/L (ref 3.5–5.1)
Sodium: 138 mEq/L (ref 136–145)
Total Protein: 6.8 g/dL (ref 6.4–8.3)

## 2014-09-06 MED ORDER — CYANOCOBALAMIN 1000 MCG/ML IJ SOLN
INTRAMUSCULAR | Status: AC
  Filled 2014-09-06: qty 1

## 2014-09-06 MED ORDER — CYANOCOBALAMIN 1000 MCG/ML IJ SOLN
1000.0000 ug | Freq: Once | INTRAMUSCULAR | Status: AC
  Administered 2014-09-06: 1000 ug via INTRAMUSCULAR

## 2014-09-06 NOTE — Therapy (Signed)
[place], [place], [address] Phone: [phone]   Fax:  [phone]  Physical Therapy Evaluation  Patient Details  Name: Michaela Shaw MRN: [id number] Date of Birth: Feb 01, 1941 Referring Provider:  Curt Bears, MD  Encounter Date: 09/06/2014      PT End of Session - 09/06/14 1452    Visit Number 1   Number of Visits 1   PT Start Time 1350   PT Stop Time 1410   PT Time Calculation (min) 20 min   Activity Tolerance Patient tolerated treatment well   Behavior During Therapy [facility] for tasks assessed/performed      Past Medical History  Diagnosis Date  . History of chicken pox   . Diabetes mellitus without complication   . Hyperlipidemia   . Hypertension   . 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 Seizures 2005  . Stroke 2011    "mild" per pt.   Marland Kitchen GERD (gastroesophageal reflux disease)   . Leg swelling     Left leg; cause is unknown; however pt stated it goes away at rest  . Impaired circulation of right leg     Takes Pletal  . CKD (chronic kidney disease)     Past Surgical History  Procedure Laterality Date  . Bladder suspension  1980  . Abdominal hysterectomy  1980    partial  . Cholecystectomy  2007  . Appendectomy  2007  . Spine surgery  2012    Has rods and pins  . Cataract extraction, bilateral    . Colonoscopy    . Upper gi endoscopy    . Video bronchoscopy with endobronchial ultrasound N/A 08/23/2014    Procedure: VIDEO BRONCHOSCOPY WITH ENDOBRONCHIAL ULTRASOUND with Biopsy;  Surgeon: Rigoberto Noel, MD;  Location: [facility];  Service: Thoracic;  Laterality: N/A;    LMP 07/06/1978  Visit Diagnosis:  Unsteady - Plan: PT eval and treat  Physical deconditioning - Plan: PT eval and treat      Subjective Assessment - 09/06/14 1437    Symptoms Has right shoulder popping and pain.  Feels unsteady on her feet sometimes.   Pertinent  History Pt. fell and had incidental finding of right lower lobe mass on chest x-ray.  Pathology indicates suspicious for carcinoma consistent with adenocarcinoma; has positive lymph nodes and metastases at other areas such as L5 transverse process.  More tissue is needed for testing; pt. is expected to have chemotherapy.  2011 had CVA with left facial weakness and couldn't walk; was in rehab x 30 days and has recovered fully.  2013 had back fusion with rod placement.                                                         Currently in Pain? Yes   Pain Score 9    Pain Location Shoulder   Pain Orientation Right   Pain Descriptors / Indicators Other (Comment)  popping   Aggravating Factors  reaching   Pain Relieving Factors tylenol          [facility] PT Assessment - 09/06/14 0001    Assessment   Medical Diagnosis right lower lobe mass with metastases; consistent with adenocarcinoma   Precautions   Precautions Other (comment)   Precaution Comments cancer precautions  Restrictions   Weight Bearing Restrictions No   Balance Screen   Has the patient fallen in the past 6 months No  but feels unsteady on her feet   Has the patient had a decrease in activity level because of a fear of falling?  No   Is the patient reluctant to leave their home because of a fear of falling?  No   Home Environment   Living Enviornment Private residence   Living Arrangements Children;Other relatives   Type of [place] entrance   [place] One level   Prior Function   Level of Independence Independent with basic ADLs;Independent with gait   Leisure no regular exercise   Observation/Other Assessments   Observations well-appearing woman   Sensation   Light Touch Not tested  reports occasional sharp pains in feet; diabetic neuropathy?   Functional Tests   Functional tests Sit to Stand   Sit to Stand   Comments 15 times in 30 seconds   Posture/Postural Control   Posture/Postural  Control Postural limitations   Posture Comments slight lean right (scoliosis?, though has a rod in her back)   ROM / Strength   AROM / PROM / Strength AROM;Strength   AROM   Overall AROM Comments standing trunk AROM:  25% loss in extension; others [facility]   Strength   Overall Strength Comments functional   Ambulation/Gait   Ambulation/Gait Yes   Ambulation/Gait Assistance 6: Modified independent (Device/Increase time)  no assistive device, but shortness of breath limits distance   Ramp 6: Modified independent (Device)  gets SOB   Balance   Balance Assessed Yes   Dynamic Standing Balance   Dynamic Standing - Comments reaches 10 inches forward in standing                          PT Education - 09/06/14 1451    Education provided Yes   Education Details posture, breathing, walking, energy conservation   Person(s) Educated Patient;Child(ren);Other (comment)  other family   Methods Explanation;Handout   Comprehension Verbalized understanding               Lung Clinic Goals - 09/06/14 1455    Patient will be able to verbalize understanding of the benefit of exercise to decrease fatigue.   Status Achieved   Patient will be able to verbalize the importance of posture.   Status Achieved   Patient will be able to demonstrate diaphragmatic breathing for improved lung function.   Status Achieved   Patient will be able to verbalize understanding of the role of physical therapy to prevent functional decline and who to contact if physical therapy is needed.   Status Achieved             Plan - 09/06/14 1452    Clinical Impression Statement Patient with newly diagnosed stage IV lung cancer has some unsteadiness on her feet and also shortness of breath with walking a distance (like in a hospital) or on a ramp.   Pt will benefit from skilled therapeutic intervention in order to improve on the following deficits Decreased endurance;Decreased balance   Rehab  Potential Fair   PT Frequency One time visit   PT Treatment/Interventions Patient/family education   PT Next Visit Plan None at this time; patient may benefit from balance re-ed and endurance training.   PT Home Exercise Plan see patient education section   Consulted and Agree with Plan of  Care Patient;Family member/caregiver          G-Codes - October 04, 2014 1455    Functional Assessment Tool Used clinical judgement   Functional Limitation Mobility: Walking and moving around   Mobility: Walking and Moving Around Current Status [phone]) At least 20 percent but less than 40 percent impaired, limited or restricted   Mobility: Walking and Moving Around Goal Status [phone]) At least 20 percent but less than 40 percent impaired, limited or restricted       Problem List Patient Active Problem List   Diagnosis Date Noted  . Non-small cell carcinoma of lung, stage 4 04-Oct-2014  . Lung mass   . Mediastinal lymphadenopathy   . Abnormal CT scan, chest 07/28/2014  . Leukocytosis 06/19/2014  . Leg swelling 06/08/2014  . Anemia, iron deficiency 05/05/2014  . Chronic renal insufficiency 03/31/2014  . PVD (peripheral vascular disease) 02/19/2014  . Pulmonary nodules 01/09/2014  . Compression fracture 01/08/2014  . Pleuritic chest pain 01/08/2014  . Early satiety 01/08/2014  . Diabetic retinopathy 07/17/2013  . HTN (hypertension) 03/07/2013  . Other and unspecified hyperlipidemia 03/07/2013  . Type 2 diabetes mellitus with neurological manifestations, uncontrolled 03/05/2013    SALISBURY,DONNA 2014-10-04, 2:58 PM  [place], [place], [address] Phone: [phone]   Fax:  [place], PT 04-Oct-2014 2:58 PM

## 2014-09-06 NOTE — [facility] Oncology Navigator Note (Unsigned)
   Thoracic Treatment Summary Name:Sianna A Edison Pace Date:09/06/2014 DOB:Mar 10, 1941 Your Medical Team Medical Oncologist:Dr. Julien Nordmann Pulmonologist: Dr. Elsworth Soho Type and Stage of Lung Cancer Non-Small Cell Carcinoma:   Clinical Stage:  Non-small cell carcinoma of lung, stage 4   Staging form: Lung, AJCC 7th Edition     Clinical stage from 09/06/2014: Stage IV (T2a, N2, M1b) - Signed by Curt Bears, MD on 09/06/2014       Staging comments: Adenocarcinoma     Clinical stage is based on radiology exams.  Pathological stage will be determined after surgery.  Staging is based on the size of the tumor, involvement of lymph nodes or not, and whether or not the cancer center has spread. Recommendations Recommendations: Chemotherapy  These recommendations are based on information available as of today's consult.  This is subject to change depending further testing or exams. Next Steps Next Step: Medical Oncology will set up follow up appointments Barriers to Care What do you perceive as a potential barrier that may prevent you from receiving your treatment plan? Education-Information on lung cancer and support services given and explained  Support-information given and explained  Financial-financial advocated will be notified to contact her  Resources Given: NCI Booklet on [employer] at [employer].Radonna Ricker [phone]    Questions Norton Blizzard, RN BSN Thoracic Oncology Nurse Navigator at [place] is a nurse navigator that is available to assist you through your cancer journey.  She can answer your questions and/or provide resources regarding your treatment plan, emotional support, or financial concerns.

## 2014-09-06 NOTE — Telephone Encounter (Signed)
Gave asv & calendar for March/April. Sent message to schedule treatment.

## 2014-09-06 NOTE — Progress Notes (Signed)
[facility] Clinical Social Work  Clinical Social Work met with patient/family at [employer] appointment to offer support and assess for psychosocial needs.  Patient was accompanied by daughter, grandson, and son-in-law.  Ms. Tollett shared she has no concerns at this time; she shared Dr. Julien Nordmann made her feel hopeful and she had confidence in his competency.  Also, she shared she has no concerns because she has faith in God that "He will take care of me".  Patient's daughter and son-in-law shared their only concerns were how patient would tolerate treatment, but they feel comforted after discussing with Dr. Julien Nordmann.  Ms. Cordell reports strongly family support and currently lives with her family.  Clinical Social Work briefly discussed Clinical Social Work role and [employer] support programs/services.  Ms. Hoopes was interested in the art classes offered.  Clinical Social Work encouraged patient to call with any additional questions or concerns.   Polo Riley, MSW, LCSW, OSW-C Clinical Social Worker [facility] [phone]

## 2014-09-06 NOTE — Progress Notes (Signed)
[place] [phone]   [phone] Multidisciplinary thoracic oncology clinic CONSULT NOTE  REFERRING PHYSICIAN: Dr. Kara Mead  REASON FOR CONSULTATION:  74 years old white female recently diagnosed with lung cancer.  HPI Michaela Shaw is a 74 y.o. female a never smoker with past medical history significant for diabetes mellitus, hypertension, dyslipidemia, history of stroke in 2011, history of seizure in 2005, and back surgery. In July 2015 the patient had a fall. Chest x-ray on 01/08/2014 showed increased density in the right mid and lower lung questionable for atelectasis, pneumonia or pulmonary contusion. CT scan of the chest without contrast on 01/09/2014 showed multiple right lung pulmonary nodules involving both upper and lower lungs with associated fibrotic changes and calcified hilar lymph nodes. The overall appearance was most consistent with postinfectious or postinflammatory process and malignancy was considered less likely at that time. Follow-up CT scan in 6 months was recommended. Repeat CT scan of the chest without contrast on 07/24/2014 showed findings concerning for progressive mediastinal and right hilar lymphadenopathy with a suspected right hilar mass with peribronchial tumor spread into the right upper Michaela Shaw and right middle Michaela Shaw. Subcarinal adenopathy has progressed and measured 3.0 x 1.9 cm compared to 2.7 x 1.6 cm on the previous scan. The individual nodular densities in the right lung have enlarged. The right middle Michaela Shaw lesion measures 1.5 x 1.1 cm compared to 1.0 x 0.9 cm previously. The 1.6 cm lesion on the previous scan measured 3.0 cm. There are small nodules in the left lung suspicious for metastatic disease. A PET scan was performed on 08/06/2014 and it showed extensive hypermetabolic tumor involving the right lung, right hilum and mediastinum. There was metastatic pulmonary nodules, metastatic left adrenal gland nodule and  metastatic bone disease. The patient was referred to Dr. Elsworth Soho and on 08/23/2014 she underwent flexible bronchoscopy with brushing and bronchoalveolar lavage of the right lower Michaela Shaw and right middle Michaela Shaw in addition to endobronchial biopsy of the right lower Michaela Shaw and endobronchial ultrasound and transbronchial needle aspiration of the subcarinal and right hilar lymph nodes.  The final pathology and cytology from these lesions (Accession: [id number]) were consistent with non-small cell lung cancer, adenocarcinoma. There was insufficient material for molecular testing.  The patient was referred to me today for evaluation and recommendation regarding treatment of her condition. When seen today she continues to complain of pain on the right shoulder as well as shortness of breath and mild wheezes. She denied having any significant chest pain, cough or hemoptysis. She lost around 30 pounds in the last few months. The patient denied having any significant visual changes but has mild headache. Family history significant for a mother with diabetes mellitus father with coronary artery disease and sister with colon cancer. The patient is single and has 2 children. She was accompanied today by her daughter Michaela Shaw and her son Michaela Shaw, as well as her grandson. The patient used to work in Psychologist, counselling as well as Therapist, music. She has a very remote history of four years of smoking. She denied having any history of alcohol or drug abuse.  HPI  Past Medical History  Diagnosis Date  . History of chicken pox   . Diabetes mellitus without complication   . Hyperlipidemia   . Hypertension   . 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 Seizures 2005  . Stroke 2011    "mild" per pt.   Marland Kitchen GERD (gastroesophageal reflux disease)   .  Leg swelling     Left leg; cause is unknown; however pt stated it goes away at rest  . Impaired circulation of right leg     Takes Pletal  . CKD  (chronic kidney disease)     Past Surgical History  Procedure Laterality Date  . Bladder suspension  1980  . Abdominal hysterectomy  1980    partial  . Cholecystectomy  2007  . Appendectomy  2007  . Spine surgery  2012    Has rods and pins  . Cataract extraction, bilateral    . Colonoscopy    . Upper gi endoscopy    . Video bronchoscopy with endobronchial ultrasound N/A 08/23/2014    Procedure: VIDEO BRONCHOSCOPY WITH ENDOBRONCHIAL ULTRASOUND with Biopsy;  Surgeon: Rigoberto Noel, MD;  [facility];  Service: Thoracic;  Laterality: N/A;    Family History  Problem Relation Age of Onset  . Heart disease Mother   . Diabetes Mother   . Alzheimer's disease Father   . Cancer Sister     colon  . Diabetes Sister   . Diabetes Daughter   . Diabetes Sister     Social History History  Substance Use Topics  . Smoking status: Never Smoker   . Smokeless tobacco: Never Used  . Alcohol Use: No    Allergies  Allergen Reactions  . Metformin And Related Other (See Comments)    Kidney function    Current Outpatient Prescriptions  Medication Sig Dispense Refill  . alendronate (FOSAMAX) 70 MG tablet Take 1 tablet (70 mg total) by mouth every 7 (seven) days. Take with a full glass of water on an empty stomach. 4 tablet 5  . amLODipine (NORVASC) 10 MG tablet Take 10 mg by mouth daily.     Marland Kitchen aspirin 81 MG tablet Take 81 mg by mouth daily.    . Blood Glucose Monitoring Suppl (ONETOUCH VERIO) W/DEVICE KIT 1 each by Does not apply route daily. Please use to test blood sugar 4 times as instructed. Dx code: E11.40 1 kit 0  . Calcium Carbonate-Vitamin D (CALCIUM 600 + D PO) Take 1 tablet by mouth every morning.    . cilostazol (PLETAL) 100 MG tablet Take 1 tablet (100 mg total) by mouth 2 (two) times daily before a meal. 60 tablet 11  . cloNIDine (CATAPRES) 0.2 MG tablet TAKE ONE TABLET BY MOUTH TWICE DAILY 60 tablet 5  . co-enzyme Q-10 30 MG capsule Take 100 mg by mouth daily.    .  fenofibrate (TRICOR) 145 MG tablet TAKE ONE TABLET BY MOUTH ONCE DAILY 30 tablet 1  . ferrous sulfate 325 (65 FE) MG tablet Take 325 mg by mouth 3 (three) times daily with meals.     Marland Kitchen glucose blood (ONETOUCH VERIO) test strip Use to test blood sugar 4 times daily as instructed. Dx code: E11.40 200 each 5  . insulin lispro (HUMALOG) 100 UNIT/ML injection To use in VGo 20 (up to 60 units daily) (Patient taking differently: Inject 10-12 units in the morning, 10-12 units at lunch and 10-12 units at supper) 60 mL 3  . insulin NPH Human (HUMULIN N,NOVOLIN N) 100 UNIT/ML injection Inject 15 units into the skin before breakfast, mixed with the Regular insulin and 10 units into the skin before dinner, mixed with the Regular insulin. (Patient taking differently: Inject 20 units into the skin before breakfast,  10 units at supper) 10 mL 2  . Insulin Syringe-Needle U-100 (RELION INSULIN SYR 0.3CC/30G) 30G X 5/16"  0.3 ML MISC Inject 1 each into the skin 2 (two) times daily.    . Insulin Syringe-Needle U-100 31G X 5/16" 0.5 ML MISC Use to inject insulin 3 times daily. 100 each 11  . omeprazole (PRILOSEC) 20 MG capsule TAKE ONE CAPSULE BY MOUTH IN THE MORNING 30 capsule 3  . ONETOUCH DELICA LANCETS 45W MISC Use to test blood sugar 4 times daily as instructed. 200 each 5  . potassium chloride SA (K-DUR,KLOR-CON) 20 MEQ tablet Take 1 tablet (20 mEq total) by mouth daily. 30 tablet 3  . rosuvastatin (CRESTOR) 20 MG tablet Take 1 tablet (20 mg total) by mouth daily. 90 tablet 3   Current Facility-Administered Medications  Medication Dose Route Frequency Provider Last Rate Last Dose  . cyanocobalamin ((VITAMIN B-12)) injection 1,000 mcg  1,000 mcg Intramuscular Once Curt Bears, MD        Review of Systems  Constitutional: positive for fatigue and weight loss Eyes: negative Ears, nose, mouth, throat, and face: negative Respiratory: positive for dyspnea on exertion Cardiovascular: negative Gastrointestinal:  negative Genitourinary:negative Integument/breast: negative Hematologic/lymphatic: negative Musculoskeletal:positive for bone pain Neurological: negative Behavioral/Psych: negative Endocrine: negative Allergic/Immunologic: negative  Physical Exam  TUU:[place], healthy, no distress, well nourished and well developed SKIN: skin color, texture, turgor are normal, no rashes or significant lesions HEAD: Normocephalic, No masses, lesions, tenderness or abnormalities EYES: normal, PERRLA EARS: External ears normal, Canals clear OROPHARYNX:no exudate, no erythema and lips, buccal mucosa, and tongue normal  NECK: supple, no adenopathy, no JVD LYMPH:  no palpable lymphadenopathy, no hepatosplenomegaly BREAST:not examined LUNGS: clear to auscultation , and palpation HEART: regular rate & rhythm, no murmurs and no gallops ABDOMEN:abdomen soft, non-tender, normal bowel sounds and no masses or organomegaly BACK: Back symmetric, no curvature., No CVA tenderness EXTREMITIES:no joint deformities, effusion, or inflammation, no edema, no skin discoloration  NEURO: alert & oriented x 3 with fluent speech, no focal motor/sensory deficits  PERFORMANCE STATUS: ECOG 1  LABORATORY DATA: Lab Results  Component Value Date   WBC 9.0 09/06/2014   HGB 9.9* 09/06/2014   HCT 30.5* 09/06/2014   MCV 84.0 09/06/2014   PLT 514* 09/06/2014      Chemistry      Component Value Date/Time   NA 138 09/06/2014 1331   NA 136 08/20/2014 0843   K 4.0 09/06/2014 1331   K 4.1 08/20/2014 0843   CL 103 08/20/2014 0843   CO2 24 09/06/2014 1331   CO2 26 08/20/2014 0843   BUN 18.7 09/06/2014 1331   BUN 17 08/20/2014 0843   CREATININE 1.5* 09/06/2014 1331   CREATININE 1.73* 08/20/2014 0843   CREATININE 1.61* 12/11/2013 1043      Component Value Date/Time   CALCIUM 9.4 09/06/2014 1331   CALCIUM 9.4 08/20/2014 0843   ALKPHOS 65 09/06/2014 1331   ALKPHOS 54 05/04/2014 1033   AST 15 09/06/2014 1331   AST 21  05/04/2014 1033   ALT 8 09/06/2014 1331   ALT 12 05/04/2014 1033   BILITOT 0.21 09/06/2014 1331   BILITOT 0.6 05/04/2014 1033       RADIOGRAPHIC STUDIES: Dg Chest Port 1 View  08/23/2014   CLINICAL DATA:  Post bronchoscopy with biopsy, history diabetes, hypertension, hyperlipidemia, stroke, GERD  EXAM: PORTABLE CHEST - 1 VIEW  COMPARISON:  01/08/2014, PET-CT 08/06/2014  FINDINGS: Enlargement of cardiac silhouette.  Atherosclerotic calcification aorta.  Pulmonary vascular congestion.  Opacities in RIGHT lung corresponding to tumor identified on prior PET-CT.  Central peribronchial thickening.  No definite  pneumothorax post bronchoscopy.  Bones demineralized.  Gaseous distention of stomach.  IMPRESSION: No pneumothorax post bronchoscopy.  Bronchitic changes with areas of opacity in the RIGHT lung corresponding to previously identified tumor.  Mild gaseous distention of stomach.   Electronically Signed   By: Lavonia Dana M.D.   On: 08/23/2014 17:47    ASSESSMENT: This is a very pleasant 74 years old white female recently diagnosed with a stage IV (T2a, N2, M1 B) non-small cell lung cancer, adenocarcinoma presented with bilateral pulmonary nodules in addition to mediastinal lymphadenopathy and metastatic disease to the left adrenal gland and bones, diagnosed in February 2016.   PLAN: I had a lengthy discussion with the patient and her family today about her current disease stage, prognosis and treatment options. I explained to the patient and her family that she has incurable condition and orders the treatment will be off palliative nature. I will complete the staging workup by ordering a MRI of the brain to rule out brain metastasis. The patient has no history of smoking except for few years. Unfortunately there was insufficient material to test for molecular markers. I will order a [employer] 360 blood test for molecular biomarker testing as the patient has a higher chance of having a targetable  molecular mutation with her remote smoking history. I also discussed with the patient her treatment options including palliative care and hospice referral versus consideration of systemic chemotherapy with carboplatin and Alimta. The patient is interested in proceeding with systemic chemotherapy and she does not want to wait until the results of the molecular studies becomes available. I recommended for her chemotherapy with carboplatin for AUC of 5 and Alimta 500 MG/M2 every 3 weeks. She is expected to start the first cycle of this treatment next week. I will arrange for the patient to receive vitamin B 12 injection today. I will call her pharmacy with prescription for folic acid 1 mg by mouth daily, Decadron 4 mg by mouth twice a day the day before, day of and day after the chemotherapy as well as Compazine 10 mg by mouth every 6 hours as needed for nausea. The patient would have a chemotherapy education class before starting the first dose of her treatment. She would come back for follow-up visit in 2 weeks for reevaluation and management of any adverse effect of her chemotherapy. The patient was seen at the multidisciplinary thoracic oncology clinic today by medical oncology, thoracic navigator, social worker as well as physical therapist. She was advised to call immediately if she has any concerning symptoms in the interval. The patient voices understanding of current disease status and treatment options and is in agreement with the current care plan.  All questions were answered. The patient knows to call the clinic with any problems, questions or concerns. We can certainly see the patient much sooner if necessary.  Thank you so much for allowing me to participate in the care of [employer]. I will continue to follow up the patient with you and assist in her care.  I spent 55 minutes counseling the patient face to face. The total time spent in the appointment was 80 minutes.  Disclaimer: This  note was dictated with voice recognition software. Similar sounding words can inadvertently be transcribed and may not be corrected upon review.   Teruko Joswick K. September 06, 2014, 3:03 PM

## 2014-09-07 ENCOUNTER — Other Ambulatory Visit: Payer: Medicare Other

## 2014-09-07 ENCOUNTER — Telehealth: Payer: Self-pay | Admitting: *Deleted

## 2014-09-07 NOTE — Telephone Encounter (Signed)
Per staff message and POF I have scheduled appts. Advised scheduler of appts. JMW  

## 2014-09-08 MED ORDER — FOLIC ACID 1 MG PO TABS: 1.0000 mg | ORAL_TABLET | Freq: Every day | ORAL | Status: AC

## 2014-09-08 MED ORDER — DEXAMETHASONE 4 MG PO TABS: ORAL_TABLET | ORAL | Status: DC

## 2014-09-08 MED ORDER — PROCHLORPERAZINE MALEATE 10 MG PO TABS: 10.0000 mg | ORAL_TABLET | Freq: Four times a day (QID) | ORAL | Status: AC | PRN

## 2014-09-10 ENCOUNTER — Encounter: Payer: Self-pay | Admitting: Family

## 2014-09-10 ENCOUNTER — Telehealth: Payer: Self-pay | Admitting: Family

## 2014-09-10 ENCOUNTER — Ambulatory Visit (INDEPENDENT_AMBULATORY_CARE_PROVIDER_SITE_OTHER): Payer: Medicare Other | Admitting: Family

## 2014-09-10 VITALS — BP 128/60 | HR 68 | Temp 98.0°F | Resp 16 | Ht 59.0 in | Wt 132.8 lb

## 2014-09-10 DIAGNOSIS — E119 Type 2 diabetes mellitus without complications: Secondary | ICD-10-CM

## 2014-09-10 DIAGNOSIS — E1141 Type 2 diabetes mellitus with diabetic mononeuropathy: Secondary | ICD-10-CM

## 2014-09-10 DIAGNOSIS — C3491 Malignant neoplasm of unspecified part of right bronchus or lung: Secondary | ICD-10-CM

## 2014-09-10 DIAGNOSIS — E1149 Type 2 diabetes mellitus with other diabetic neurological complication: Secondary | ICD-10-CM

## 2014-09-10 DIAGNOSIS — IMO0002 Reserved for concepts with insufficient information to code with codable children: Secondary | ICD-10-CM

## 2014-09-10 DIAGNOSIS — E1165 Type 2 diabetes mellitus with hyperglycemia: Secondary | ICD-10-CM

## 2014-09-10 LAB — HEMOGLOBIN A1C: Hgb A1c MFr Bld: 7.3 % — ABNORMAL HIGH (ref 4.6–6.5)

## 2014-09-10 NOTE — Progress Notes (Signed)
Subjective:    Patient ID: Michaela Shaw, female    DOB: 12-19-1940, 74 y.o.   MRN: [id number]  HPI  Michaela Shaw is a 74 yr old female who presents today for follow up.  She was last seen back on 1/21 and was noted to have RML lung lesion. She subsequently underwent bronch/biopsy and path revealed adenocarcinoma. She was referred to oncology- Dr. Earlie Server.  The final pathology and cytology from these lesions (Accession: [id number]) were consistent with non-small cell lung cancer, adenocarcinoma. The plan at this point is for her to undergo palliative chemo and an MRI of the brain to rule out brain mets.  Pt is currently maintained on the following medications for diabetes: humalog, nph Last A1C was:   Lab Results  Component Value Date   HGBA1C 8.7* 06/11/2014  Reports one sugar of 64- ate peanut butter and came up to 170's 15 min later Home glucose readings range: was 128 this AM    Review of Systems See HPI  Past Medical History  Diagnosis Date  . History of chicken pox   . Diabetes mellitus without complication   . Hyperlipidemia   . Hypertension   . 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 Seizures 2005  . Stroke 2011    "mild" per pt.   Marland Kitchen GERD (gastroesophageal reflux disease)   . Leg swelling     Left leg; cause is unknown; however pt stated it goes away at rest  . Impaired circulation of right leg     Takes Pletal  . CKD (chronic kidney disease)     History   Social History  . Marital Status: Legally Separated    Spouse Name: N/A  . Number of Children: N/A  . Years of Education: N/A   Occupational History  . Not on file.   Social History Main Topics  . Smoking status: Never Smoker   . Smokeless tobacco: Never Used  . Alcohol Use: No  . Drug Use: No  . Sexual Activity: Not on file   Other Topics Concern  . Not on file   Social History Narrative   Son living with her, lives with daughter   RetiredPhotographer,  Media work- Psychologist, counselling   Enjoys crafts shows   Completed 11 th grade   3 grand children                Past Surgical History  Procedure Laterality Date  . Bladder suspension  1980  . Abdominal hysterectomy  1980    partial  . Cholecystectomy  2007  . Appendectomy  2007  . Spine surgery  2012    Has rods and pins  . Cataract extraction, bilateral    . Colonoscopy    . Upper gi endoscopy    . Video bronchoscopy with endobronchial ultrasound N/A 08/23/2014    Procedure: VIDEO BRONCHOSCOPY WITH ENDOBRONCHIAL ULTRASOUND with Biopsy;  Surgeon: Rigoberto Noel, MD;  Location: [facility] OR;  Service: Thoracic;  Laterality: N/A;    Family History  Problem Relation Age of Onset  . Heart disease Mother   . Diabetes Mother   . Alzheimer's disease Father   . Cancer Sister     colon  . Diabetes Sister   . Diabetes Daughter   . Diabetes Sister     Allergies  Allergen Reactions  . Metformin And Related Other (See Comments)    Kidney function    Current Outpatient Prescriptions on File Prior  to Visit  Medication Sig Dispense Refill  . alendronate (FOSAMAX) 70 MG tablet Take 1 tablet (70 mg total) by mouth every 7 (seven) days. Take with a full glass of water on an empty stomach. 4 tablet 5  . amLODipine (NORVASC) 10 MG tablet Take 10 mg by mouth daily.     Marland Kitchen aspirin 81 MG tablet Take 81 mg by mouth daily.    . Blood Glucose Monitoring Suppl (ONETOUCH VERIO) W/DEVICE KIT 1 each by Does not apply route daily. Please use to test blood sugar 4 times as instructed. Dx code: E11.40 1 kit 0  . Calcium Carbonate-Vitamin D (CALCIUM 600 + D PO) Take 1 tablet by mouth every morning.    . cilostazol (PLETAL) 100 MG tablet Take 1 tablet (100 mg total) by mouth 2 (two) times daily before a meal. 60 tablet 11  . cloNIDine (CATAPRES) 0.2 MG tablet TAKE ONE TABLET BY MOUTH TWICE DAILY 60 tablet 5  . co-enzyme Q-10 30 MG capsule Take 100 mg by mouth daily.    Marland Kitchen dexamethasone (DECADRON) 4 MG tablet 4 mg  by mouth twice a day the day before, day of and day after the chemotherapy every 3 weeks 40 tablet 1  . fenofibrate (TRICOR) 145 MG tablet TAKE ONE TABLET BY MOUTH ONCE DAILY 30 tablet 1  . ferrous sulfate 325 (65 FE) MG tablet Take 325 mg by mouth 3 (three) times daily with meals.     . folic acid (FOLVITE) 1 MG tablet Take 1 tablet (1 mg total) by mouth daily. 30 tablet 3  . glucose blood (ONETOUCH VERIO) test strip Use to test blood sugar 4 times daily as instructed. Dx code: E11.40 200 each 5  . insulin lispro (HUMALOG) 100 UNIT/ML injection To use in VGo 20 (up to 60 units daily) (Patient taking differently: Inject 10-12 units in the morning, 10-12 units at lunch and 10-12 units at supper) 60 mL 3  . insulin NPH Human (HUMULIN N,NOVOLIN N) 100 UNIT/ML injection Inject 15 units into the skin before breakfast, mixed with the Regular insulin and 10 units into the skin before dinner, mixed with the Regular insulin. (Patient taking differently: Inject 20 units into the skin before breakfast,  10 units at supper) 10 mL 2  . Insulin Syringe-Needle U-100 (RELION INSULIN SYR 0.3CC/30G) 30G X 5/16" 0.3 ML MISC Inject 1 each into the skin 2 (two) times daily.    . Insulin Syringe-Needle U-100 31G X 5/16" 0.5 ML MISC Use to inject insulin 3 times daily. 100 each 11  . omeprazole (PRILOSEC) 20 MG capsule TAKE ONE CAPSULE BY MOUTH IN THE MORNING 30 capsule 3  . ONETOUCH DELICA LANCETS 69G MISC Use to test blood sugar 4 times daily as instructed. 200 each 5  . potassium chloride SA (K-DUR,KLOR-CON) 20 MEQ tablet Take 1 tablet (20 mEq total) by mouth daily. 30 tablet 3  . prochlorperazine (COMPAZINE) 10 MG tablet Take 1 tablet (10 mg total) by mouth every 6 (six) hours as needed for nausea or vomiting. 60 tablet 0  . rosuvastatin (CRESTOR) 20 MG tablet Take 1 tablet (20 mg total) by mouth daily. 90 tablet 3   No current facility-administered medications on file prior to visit.    BP 128/60 mmHg  Pulse 68   Temp(Src) 98 F (36.7 C) (Oral)  Resp 16  Ht 4' 11" (1.499 m)  Wt 132 lb 12.8 oz (60.238 kg)  BMI 26.81 kg/m2  SpO2 99%  LMP 07/06/1978  Objective:   Physical Exam  Constitutional: She appears well-developed and well-nourished.  Cardiovascular: Normal rate, regular rhythm and normal heart sounds.   No murmur heard. Pulmonary/Chest: Effort normal and breath sounds normal. No respiratory distress. She has no wheezes.  Musculoskeletal: She exhibits no edema.  Psychiatric: She has a normal mood and affect. Her behavior is normal. Judgment and thought content normal.          Assessment & Plan:  15 minutes spent with pt today.  >50% of this time was spent counseling pt on her new diagnosis of lung cancer.

## 2014-09-10 NOTE — Patient Instructions (Signed)
Please complete lab work prior to leaving.   Please schedule a follow up appointment in 3 months.  

## 2014-09-10 NOTE — Assessment & Plan Note (Signed)
Obtain a1c, if low, consider decreasing insulin. Advised pt to contact us if further hypoglycemia.

## 2014-09-10 NOTE — Progress Notes (Signed)
Pre visit review using our clinic review tool, if applicable. No additional management support is needed unless otherwise documented below in the visit note. 

## 2014-09-10 NOTE — Telephone Encounter (Signed)
Opened in error

## 2014-09-10 NOTE — Assessment & Plan Note (Signed)
Support provided today. Advised pt to keep her upcoming appointments with oncology.

## 2014-09-12 ENCOUNTER — Other Ambulatory Visit: Payer: Medicare Other

## 2014-09-12 ENCOUNTER — Ambulatory Visit (HOSPITAL_BASED_OUTPATIENT_CLINIC_OR_DEPARTMENT_OTHER): Payer: Medicare Other

## 2014-09-12 ENCOUNTER — Other Ambulatory Visit (HOSPITAL_BASED_OUTPATIENT_CLINIC_OR_DEPARTMENT_OTHER): Payer: Medicare Other

## 2014-09-12 DIAGNOSIS — C3491 Malignant neoplasm of unspecified part of right bronchus or lung: Secondary | ICD-10-CM

## 2014-09-12 DIAGNOSIS — C7951 Secondary malignant neoplasm of bone: Secondary | ICD-10-CM

## 2014-09-12 DIAGNOSIS — Z5111 Encounter for antineoplastic chemotherapy: Secondary | ICD-10-CM

## 2014-09-12 DIAGNOSIS — C7972 Secondary malignant neoplasm of left adrenal gland: Secondary | ICD-10-CM

## 2014-09-12 LAB — CBC WITH DIFFERENTIAL/PLATELET
BASO%: 0.3 % (ref 0.0–2.0)
Basophils Absolute: 0 10*3/uL (ref 0.0–0.1)
EOS ABS: 0 10*3/uL (ref 0.0–0.5)
EOS%: 0 % (ref 0.0–7.0)
HCT: 27.9 % — ABNORMAL LOW (ref 34.8–46.6)
HEMOGLOBIN: 8.7 g/dL — AB (ref 11.6–15.9)
LYMPH#: 0.8 10*3/uL — AB (ref 0.9–3.3)
LYMPH%: 6.8 % — AB (ref 14.0–49.7)
MCH: 26.1 pg (ref 25.1–34.0)
MCHC: 31.3 g/dL — ABNORMAL LOW (ref 31.5–36.0)
MCV: 83.4 fL (ref 79.5–101.0)
MONO#: 0.2 10*3/uL (ref 0.1–0.9)
MONO%: 1.4 % (ref 0.0–14.0)
NEUT#: 10.9 10*3/uL — ABNORMAL HIGH (ref 1.5–6.5)
NEUT%: 91.5 % — ABNORMAL HIGH (ref 38.4–76.8)
PLATELETS: 568 10*3/uL — AB (ref 145–400)
RBC: 3.35 10*6/uL — ABNORMAL LOW (ref 3.70–5.45)
RDW: 15.7 % — ABNORMAL HIGH (ref 11.2–14.5)
WBC: 11.9 10*3/uL — ABNORMAL HIGH (ref 3.9–10.3)

## 2014-09-12 LAB — COMPREHENSIVE METABOLIC PANEL (CC13)
ALT: 12 U/L (ref 0–55)
ANION GAP: 14 meq/L — AB (ref 3–11)
AST: 10 U/L (ref 5–34)
Albumin: 3.5 g/dL (ref 3.5–5.0)
Alkaline Phosphatase: 62 U/L (ref 40–150)
BUN: 26.7 mg/dL — ABNORMAL HIGH (ref 7.0–26.0)
CALCIUM: 9.2 mg/dL (ref 8.4–10.4)
CO2: 18 mEq/L — ABNORMAL LOW (ref 22–29)
Chloride: 103 mEq/L (ref 98–109)
Creatinine: 1.8 mg/dL — ABNORMAL HIGH (ref 0.6–1.1)
EGFR: 28 mL/min/{1.73_m2} — ABNORMAL LOW (ref >90)
Glucose: 476 mg/dl — ABNORMAL HIGH (ref 70–140)
POTASSIUM: 4.2 meq/L (ref 3.5–5.1)
Sodium: 135 mEq/L — ABNORMAL LOW (ref 136–145)
Total Protein: 6.8 g/dL (ref 6.4–8.3)

## 2014-09-12 MED ORDER — DEXAMETHASONE SODIUM PHOSPHATE 100 MG/10ML IJ SOLN
Freq: Once | INTRAMUSCULAR | Status: AC
  Administered 2014-09-12: 15:00:00 via INTRAVENOUS
  Filled 2014-09-12: qty 8

## 2014-09-12 MED ORDER — SODIUM CHLORIDE 0.9 % IV SOLN: 390.0000 mg/m2 | Freq: Once | INTRAVENOUS | Status: DC

## 2014-09-12 MED ORDER — SODIUM CHLORIDE 0.9 % IV SOLN
Freq: Once | INTRAVENOUS | Status: AC
  Administered 2014-09-12: 15:00:00 via INTRAVENOUS

## 2014-09-12 MED ORDER — SODIUM CHLORIDE 0.9 % IV SOLN
278.5000 mg | Freq: Once | INTRAVENOUS | Status: AC
  Administered 2014-09-12: 280 mg via INTRAVENOUS
  Filled 2014-09-12: qty 28

## 2014-09-12 NOTE — Progress Notes (Signed)
Per Dr. Julien Nordmann, okay to tx with carboplatin today with creatinine 1.8, no alimta.

## 2014-09-12 NOTE — Patient Instructions (Signed)
[facility] Discharge Instructions for Patients Receiving Chemotherapy  Today you received the following chemotherapy agents: Carboplatin  To help prevent nausea and vomiting after your treatment, we encourage you to take your nausea medication as prescribed by your physician: Compazine 10 mg every 6 hrs as needed for nausea.   If you develop nausea and vomiting that is not controlled by your nausea medication, call the clinic.   BELOW ARE SYMPTOMS THAT SHOULD BE REPORTED IMMEDIATELY:  *FEVER GREATER THAN 100.5 F  *CHILLS WITH OR WITHOUT FEVER  NAUSEA AND VOMITING THAT IS NOT CONTROLLED WITH YOUR NAUSEA MEDICATION  *UNUSUAL SHORTNESS OF BREATH  *UNUSUAL BRUISING OR BLEEDING  TENDERNESS IN MOUTH AND THROAT WITH OR WITHOUT PRESENCE OF ULCERS  *URINARY PROBLEMS  *BOWEL PROBLEMS  UNUSUAL RASH Items with * indicate a potential emergency and should be followed up as soon as possible.  Feel free to call the clinic you have any questions or concerns. The clinic phone number is [phone].  Carboplatin injection What is this medicine? CARBOPLATIN (KAR boe pla tin) is a chemotherapy drug. It targets fast dividing cells, like cancer cells, and causes these cells to die. This medicine is used to treat ovarian cancer and many other cancers. This medicine may be used for other purposes; ask your health care provider or pharmacist if you have questions. COMMON BRAND NAME(S): Paraplatin What should I tell my health care provider before I take this medicine? They need to know if you have any of these conditions: -blood disorders -hearing problems -kidney disease -recent or ongoing radiation therapy -an unusual or allergic reaction to carboplatin, cisplatin, other chemotherapy, other medicines, foods, dyes, or preservatives -pregnant or trying to get pregnant -breast-feeding How should I use this medicine? This drug is usually given as an infusion into a vein. It is  administered in a hospital or clinic by a specially trained health care professional. Talk to your pediatrician regarding the use of this medicine in children. Special care may be needed. Overdosage: If you think you have taken too much of this medicine contact a poison control center or emergency room at once. NOTE: This medicine is only for you. Do not share this medicine with others. What if I miss a dose? It is important not to miss a dose. Call your doctor or health care professional if you are unable to keep an appointment. What may interact with this medicine? -medicines for seizures -medicines to increase blood counts like filgrastim, pegfilgrastim, sargramostim -some antibiotics like amikacin, gentamicin, neomycin, streptomycin, tobramycin -vaccines Talk to your doctor or health care professional before taking any of these medicines: -acetaminophen -aspirin -ibuprofen -ketoprofen -naproxen This list may not describe all possible interactions. Give your health care provider a list of all the medicines, herbs, non-prescription drugs, or dietary supplements you use. Also tell them if you smoke, drink alcohol, or use illegal drugs. Some items may interact with your medicine. What should I watch for while using this medicine? Your condition will be monitored carefully while you are receiving this medicine. You will need important blood work done while you are taking this medicine. This drug may make you feel generally unwell. This is not uncommon, as chemotherapy can affect healthy cells as well as cancer cells. Report any side effects. Continue your course of treatment even though you feel ill unless your doctor tells you to stop. In some cases, you may be given additional medicines to help with side effects. Follow all directions for their use.  Call your doctor or health care professional for advice if you get a fever, chills or sore throat, or other symptoms of a cold or flu. Do not  treat yourself. This drug decreases your body's ability to fight infections. Try to avoid being around people who are sick. This medicine may increase your risk to bruise or bleed. Call your doctor or health care professional if you notice any unusual bleeding. Be careful brushing and flossing your teeth or using a toothpick because you may get an infection or bleed more easily. If you have any dental work done, tell your dentist you are receiving this medicine. Avoid taking products that contain aspirin, acetaminophen, ibuprofen, naproxen, or ketoprofen unless instructed by your doctor. These medicines may hide a fever. Do not become pregnant while taking this medicine. Women should inform their doctor if they wish to become pregnant or think they might be pregnant. There is a potential for serious side effects to an unborn child. Talk to your health care professional or pharmacist for more information. Do not breast-feed an infant while taking this medicine. What side effects may I notice from receiving this medicine? Side effects that you should report to your doctor or health care professional as soon as possible: -allergic reactions like skin rash, itching or hives, swelling of the face, lips, or tongue -signs of infection - fever or chills, cough, sore throat, pain or difficulty passing urine -signs of decreased platelets or bleeding - bruising, pinpoint red spots on the skin, black, tarry stools, nosebleeds -signs of decreased red blood cells - unusually weak or tired, fainting spells, lightheadedness -breathing problems -changes in hearing -changes in vision -chest pain -high blood pressure -low blood counts - This drug may decrease the number of white blood cells, red blood cells and platelets. You may be at increased risk for infections and bleeding. -nausea and vomiting -pain, swelling, redness or irritation at the injection site -pain, tingling, numbness in the hands or feet -problems  with balance, talking, walking -trouble passing urine or change in the amount of urine Side effects that usually do not require medical attention (report to your doctor or health care professional if they continue or are bothersome): -hair loss -loss of appetite -metallic taste in the mouth or changes in taste This list may not describe all possible side effects. Call your doctor for medical advice about side effects. You may report side effects to FDA at 1-800-FDA-1088. Where should I keep my medicine? This drug is given in a hospital or clinic and will not be stored at home. NOTE: This sheet is a summary. It may not cover all possible information. If you have questions about this medicine, talk to your doctor, pharmacist, or health care provider.  2015, [employer]vier/Gold Standard. (2007-09-27 14:38:05)

## 2014-09-13 ENCOUNTER — Ambulatory Visit (INDEPENDENT_AMBULATORY_CARE_PROVIDER_SITE_OTHER): Payer: Medicare Other | Admitting: Pulmonary Disease

## 2014-09-13 ENCOUNTER — Encounter: Payer: Self-pay | Admitting: Pulmonary Disease

## 2014-09-13 ENCOUNTER — Telehealth: Payer: Self-pay | Admitting: *Deleted

## 2014-09-13 VITALS — BP 151/72 | HR 102 | Temp 97.8°F | Ht 59.0 in | Wt 133.0 lb

## 2014-09-13 DIAGNOSIS — C3491 Malignant neoplasm of unspecified part of right bronchus or lung: Secondary | ICD-10-CM

## 2014-09-13 NOTE — Progress Notes (Signed)
   Subjective:    Patient ID: Michaela Shaw, female    DOB: 1940/09/13, 74 y.o.   MRN: [id number]  HPI  74 year old nonsmoker with hypertension and diabetes  For FU of adenoCA lung  Significant tests/ events  CXR after a fall- showed increased density in the right mid and lower lung. CT chest 01/09/14 showed T11 compression fracture, acute and multiple right-sided lung nodules with calcified hilar lymph nodes. MRI thoracic spine- confirmed benign appearing compression fracture of T11, no spinal cord compression. CT chest 07/24/14 showed stable calcified mediastinal and hilar lymph nodes. Right hilar mass and subcarinal lymphadenopathy he appeared larger -30x19 mm. Nodules in the right lung had enlarged. There were numerous small nodules in the left lung. Calcified granulomas were noted in the spleen.  EBUS showed adenocarcinoma, not enough specimen to send for markers. She was seen by oncology, started on carboplatin/Alimta . Awaiting blood test for tumor markers.   Review of Systems neg for any significant sore throat, dysphagia, itching, sneezing, nasal congestion or excess/ purulent secretions, fever, chills, sweats, unintended wt loss, pleuritic or exertional cp, hempoptysis, orthopnea pnd or change in chronic leg swelling. Also denies presyncope, palpitations, heartburn, abdominal pain, nausea, vomiting, diarrhea or change in bowel or urinary habits, dysuria,hematuria, rash, arthralgias, visual complaints, headache, numbness weakness or ataxia.     Objective:   Physical Exam  Gen. Pleasant, well-nourished, in no distress ENT - no lesions, no post nasal drip Neck: No JVD, no thyromegaly, no carotid bruits Lungs: no use of accessory muscles, no dullness to percussion, clear without rales or rhonchi  Cardiovascular: Rhythm regular, heart sounds  normal, no murmurs or gallops, no peripheral edema Musculoskeletal: No deformities, no cyanosis or clubbing         Assessment & Plan:

## 2014-09-13 NOTE — Telephone Encounter (Signed)
No answer/busy for chemotherapy follow up call

## 2014-09-13 NOTE — Telephone Encounter (Signed)
-----   Message from Adalberto Cole, RN sent at 09/12/2014  3:21 PM EST ----- Regarding: chemo follow up 1st time carboplatin

## 2014-09-14 ENCOUNTER — Other Ambulatory Visit: Payer: Self-pay | Admitting: Internal Medicine

## 2014-09-14 ENCOUNTER — Ambulatory Visit (HOSPITAL_COMMUNITY)
Admission: RE | Admit: 2014-09-14 | Discharge: 2014-09-14 | Disposition: A | Discharge status: Home / Self Care | Payer: Medicare Other | Source: Ambulatory Visit | Attending: Internal Medicine | Admitting: Internal Medicine

## 2014-09-14 DIAGNOSIS — C3491 Malignant neoplasm of unspecified part of right bronchus or lung: Secondary | ICD-10-CM | POA: Insufficient documentation

## 2014-09-14 NOTE — Assessment & Plan Note (Signed)
She tolerated her first cycle well. She is surprisingly upbeat and has a positive attitude. We will await repeat imaging after completing her cycles of chemotherapy. We'll also await blood test for tumor markers-as per oncology

## 2014-09-19 ENCOUNTER — Other Ambulatory Visit (HOSPITAL_BASED_OUTPATIENT_CLINIC_OR_DEPARTMENT_OTHER): Payer: Medicare Other

## 2014-09-19 ENCOUNTER — Telehealth: Payer: Self-pay | Admitting: Nurse Practitioner

## 2014-09-19 ENCOUNTER — Ambulatory Visit (HOSPITAL_BASED_OUTPATIENT_CLINIC_OR_DEPARTMENT_OTHER): Payer: Medicare Other | Admitting: Nurse Practitioner

## 2014-09-19 ENCOUNTER — Encounter: Payer: Self-pay | Admitting: *Deleted

## 2014-09-19 VITALS — BP 154/58 | HR 96 | Temp 98.2°F | Resp 18 | Ht 59.0 in | Wt 131.0 lb

## 2014-09-19 DIAGNOSIS — C7951 Secondary malignant neoplasm of bone: Secondary | ICD-10-CM

## 2014-09-19 DIAGNOSIS — C3491 Malignant neoplasm of unspecified part of right bronchus or lung: Secondary | ICD-10-CM

## 2014-09-19 DIAGNOSIS — C7970 Secondary malignant neoplasm of unspecified adrenal gland: Secondary | ICD-10-CM

## 2014-09-19 LAB — CBC WITH DIFFERENTIAL/PLATELET
BASO%: 0.5 % (ref 0.0–2.0)
BASOS ABS: 0 10*3/uL (ref 0.0–0.1)
EOS%: 2.9 % (ref 0.0–7.0)
Eosinophils Absolute: 0.2 10*3/uL (ref 0.0–0.5)
HCT: 27.3 % — ABNORMAL LOW (ref 34.8–46.6)
HGB: 8.8 g/dL — ABNORMAL LOW (ref 11.6–15.9)
LYMPH%: 17 % (ref 14.0–49.7)
MCH: 26.7 pg (ref 25.1–34.0)
MCHC: 32.3 g/dL (ref 31.5–36.0)
MCV: 82.6 fL (ref 79.5–101.0)
MONO#: 0.6 10*3/uL (ref 0.1–0.9)
MONO%: 7.8 % (ref 0.0–14.0)
NEUT#: 5.8 10*3/uL (ref 1.5–6.5)
NEUT%: 71.8 % (ref 38.4–76.8)
Platelets: 536 10*3/uL — ABNORMAL HIGH (ref 145–400)
RBC: 3.3 10*6/uL — AB (ref 3.70–5.45)
RDW: 14.8 % — AB (ref 11.2–14.5)
WBC: 8 10*3/uL (ref 3.9–10.3)
lymph#: 1.4 10*3/uL (ref 0.9–3.3)

## 2014-09-19 LAB — COMPREHENSIVE METABOLIC PANEL (CC13)
ALBUMIN: 3 g/dL — AB (ref 3.5–5.0)
ALT: 12 U/L (ref 0–55)
AST: 18 U/L (ref 5–34)
Alkaline Phosphatase: 54 U/L (ref 40–150)
Anion Gap: 10 mEq/L (ref 3–11)
BILIRUBIN TOTAL: 0.3 mg/dL (ref 0.20–1.20)
BUN: 14.3 mg/dL (ref 7.0–26.0)
CALCIUM: 8.8 mg/dL (ref 8.4–10.4)
CHLORIDE: 104 meq/L (ref 98–109)
CO2: 22 meq/L (ref 22–29)
Creatinine: 1.3 mg/dL — ABNORMAL HIGH (ref 0.6–1.1)
EGFR: 41 mL/min/{1.73_m2} — ABNORMAL LOW (ref >90)
Glucose: 97 mg/dl (ref 70–140)
POTASSIUM: 3.5 meq/L (ref 3.5–5.1)
SODIUM: 136 meq/L (ref 136–145)
Total Protein: 6.3 g/dL — ABNORMAL LOW (ref 6.4–8.3)

## 2014-09-19 NOTE — Telephone Encounter (Signed)
Gave avs & calendar for March/April. Sent message to move treatment.

## 2014-09-19 NOTE — [facility] Oncology Navigator Note (Unsigned)
[place] to inquire about patients [employer] test results.  They stated it would be resulted out on 09/25/14, Dr. Julien Nordmann is aware.

## 2014-09-19 NOTE — Progress Notes (Addendum)
[facility] OFFICE PROGRESS NOTE   Diagnosis:  Stage IV (T2a, N2, M1 B) non-small cell lung cancer, adenocarcinoma presented with bilateral pulmonary nodules in addition to mediastinal lymphadenopathy and metastatic disease to the left adrenal gland and bones, diagnosed in February 2016.  CURRENT THERAPY: Carboplatin/Alimta every 3 weeks beginning 09/12/2014. Alimta held with cycle 1 due to renal dysfunction.  INTERVAL HISTORY:   Ms. Veale returns as scheduled. She completed cycle 1 carboplatin 09/12/2014. Alimta was held due to her renal function. She denies nausea/vomiting. No mouth sores. No diarrhea. She has noted some constipation. No rash. She has stable mild swelling of the left leg which has been present for 2 years. She denies shortness of breath. No fever. She has a "tickle" in her throat.  Objective:  Vital signs in last 24 hours:  Blood pressure 154/58, pulse 96, temperature 98.2 F (36.8 C), temperature source Oral, resp. rate 18, height 4\' 11"  (1.499 m), weight 131 lb (59.421 kg), last menstrual period 07/06/1978.    HEENT: No thrush or ulcers. Lymphatics: No palpable cervical or supra-clavicular lymph nodes. Resp: Lungs clear bilaterally. Cardio: Regular rate and rhythm. GI: Abdomen soft and nontender. No hepatomegaly. Vascular: Trace edema at the left lower leg. Calves soft and nontender. Neuro: Alert and oriented.  Skin: No rash.    Lab Results:  Lab Results  Component Value Date   WBC 8.0 09/19/2014   HGB 8.8* 09/19/2014   HCT 27.3* 09/19/2014   MCV 82.6 09/19/2014   PLT 536* 09/19/2014   NEUTROABS 5.8 09/19/2014    Imaging:  No results found.  Medications: I have reviewed the patient's current medications.  Assessment/Plan: 1. Stage IV (T2a, N2, M1 B) non-small cell lung cancer, adenocarcinoma, presented with bilateral pulmonary nodules in addition to mediastinal lymphadenopathy and metastatic disease to the left adrenal gland and  bones, diagnosed in February 2016.    Disposition: Ms. Lansdale appears stable. She has completed 1 cycle of carboplatin. Alimta was held with cycle 1 due to her renal function. The renal function is improved today. She will continue weekly labs. She will return for a follow-up visit and cycle 2 carboplatin/Alimta (pending the renal function from that day) on 10/03/2014.   We will follow-up on the [employer] molecular biomarker testing.   Patient seen with Dr. Julien Nordmann.  Ned Card ANP/GNP-BC   09/19/2014  2:47 PM   ADDENDUM: Hematology/Oncology Attending:  I had a face to face encounter with the patient. I recommended her care plan. This is a very pleasant 74 years old white female with metastatic non-small cell lung cancer, adenocarcinoma who is currently undergoing systemic chemotherapy with carboplatin and Alimta status post 1 cycle. Alimta was discontinued on cycle #1 secondary to worsening renal function and the patient received only carboplatin for cycle #1. She is feeling fine and tolerated the first week of her treatment fairly well. Her molecular studies are still pending. I recommended for the patient to continue with her weekly blood work and we will see her back for follow-up visit in 2 weeks for reevaluation before starting cycle #2.  Her serum creatinine is better today and the patient may be able to receive carboplatin and Alimta next cycle as a scheduled. She was advised to call immediately if she has any concerning symptoms in the interval.  Disclaimer: This note was dictated with voice recognition software. Similar sounding words can inadvertently be transcribed and may be missed upon review. Eilleen Kempf., MD 09/19/2014

## 2014-09-20 ENCOUNTER — Telehealth: Payer: Self-pay | Admitting: *Deleted

## 2014-09-20 NOTE — Telephone Encounter (Signed)
Per staff message and POF I have scheduled appts. Advised scheduler of appts. JMW  

## 2014-09-21 LAB — FUNGUS CULTURE W SMEAR: Fungal Smear: NONE SEEN

## 2014-09-26 ENCOUNTER — Other Ambulatory Visit (HOSPITAL_BASED_OUTPATIENT_CLINIC_OR_DEPARTMENT_OTHER): Payer: Medicare Other

## 2014-09-26 ENCOUNTER — Encounter: Payer: Self-pay | Admitting: *Deleted

## 2014-09-26 DIAGNOSIS — C3491 Malignant neoplasm of unspecified part of right bronchus or lung: Secondary | ICD-10-CM | POA: Diagnosis not present

## 2014-09-26 LAB — CBC WITH DIFFERENTIAL/PLATELET
BASO%: 1.3 % (ref 0.0–2.0)
BASOS ABS: 0.1 10*3/uL (ref 0.0–0.1)
EOS ABS: 0.1 10*3/uL (ref 0.0–0.5)
EOS%: 1.1 % (ref 0.0–7.0)
HCT: 32.5 % — ABNORMAL LOW (ref 34.8–46.6)
HEMOGLOBIN: 10.6 g/dL — AB (ref 11.6–15.9)
LYMPH%: 20.5 % (ref 14.0–49.7)
MCH: 26.8 pg (ref 25.1–34.0)
MCHC: 32.7 g/dL (ref 31.5–36.0)
MCV: 82.1 fL (ref 79.5–101.0)
MONO#: 0.6 10*3/uL (ref 0.1–0.9)
MONO%: 7.5 % (ref 0.0–14.0)
NEUT%: 69.6 % (ref 38.4–76.8)
NEUTROS ABS: 5.2 10*3/uL (ref 1.5–6.5)
Platelets: 511 10*3/uL — ABNORMAL HIGH (ref 145–400)
RBC: 3.96 10*6/uL (ref 3.70–5.45)
RDW: 14.7 % — AB (ref 11.2–14.5)
WBC: 7.5 10*3/uL (ref 3.9–10.3)
lymph#: 1.5 10*3/uL (ref 0.9–3.3)

## 2014-09-26 LAB — COMPREHENSIVE METABOLIC PANEL (CC13)
ALBUMIN: 3.4 g/dL — AB (ref 3.5–5.0)
ALT: 14 U/L (ref 0–55)
AST: 15 U/L (ref 5–34)
Alkaline Phosphatase: 70 U/L (ref 40–150)
Anion Gap: 13 mEq/L — ABNORMAL HIGH (ref 3–11)
BUN: 14.3 mg/dL (ref 7.0–26.0)
CHLORIDE: 103 meq/L (ref 98–109)
CO2: 23 mEq/L (ref 22–29)
Calcium: 9.9 mg/dL (ref 8.4–10.4)
Creatinine: 1.1 mg/dL (ref 0.6–1.1)
EGFR: 51 mL/min/{1.73_m2} — ABNORMAL LOW (ref >90)
Glucose: 139 mg/dl (ref 70–140)
POTASSIUM: 3.6 meq/L (ref 3.5–5.1)
Sodium: 139 mEq/L (ref 136–145)
Total Bilirubin: 0.34 mg/dL (ref 0.20–1.20)
Total Protein: 7.4 g/dL (ref 6.4–8.3)

## 2014-09-26 NOTE — [facility] Oncology Navigator Note (Unsigned)
Per Dr. Worthy Flank request, I called [employer] to obtain lab results, they will fax.

## 2014-09-27 ENCOUNTER — Telehealth: Payer: Self-pay

## 2014-09-27 NOTE — Telephone Encounter (Signed)
Michaela Shaw called requesting tumor marker results. It has been 3 weeks. Ms Rosana Hoes said it would determine if mother can take oral chemo rather than infusion.  No results found in computer nor on Dr [employer]. explaned to Ms Rosana Hoes that Dr Julien Nordmann is out of the office until Monday. Routed to Abelina Bachelor RN and Dr Julien Nordmann

## 2014-09-28 NOTE — Telephone Encounter (Signed)
Results are back but did not show any first line treatment actionable mutations. She will need to continue chemo for now. 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

## 2014-10-01 ENCOUNTER — Telehealth: Payer: Self-pay | Admitting: Physician Assistant

## 2014-10-01 ENCOUNTER — Ambulatory Visit (INDEPENDENT_AMBULATORY_CARE_PROVIDER_SITE_OTHER): Payer: Medicare Other | Admitting: Internal Medicine

## 2014-10-01 ENCOUNTER — Encounter: Payer: Self-pay | Admitting: Internal Medicine

## 2014-10-01 VITALS — BP 134/68 | HR 102 | Temp 98.7°F | Resp 12 | Wt 123.4 lb

## 2014-10-01 DIAGNOSIS — E1149 Type 2 diabetes mellitus with other diabetic neurological complication: Secondary | ICD-10-CM

## 2014-10-01 DIAGNOSIS — IMO0002 Reserved for concepts with insufficient information to code with codable children: Secondary | ICD-10-CM

## 2014-10-01 DIAGNOSIS — E1141 Type 2 diabetes mellitus with diabetic mononeuropathy: Secondary | ICD-10-CM

## 2014-10-01 DIAGNOSIS — E1165 Type 2 diabetes mellitus with hyperglycemia: Principal | ICD-10-CM

## 2014-10-01 NOTE — Telephone Encounter (Signed)
I left a message for pt to call me .

## 2014-10-01 NOTE — Progress Notes (Signed)
Patient ID: Michaela Shaw, female   DOB: July 12, 1940, 74 y.o.   MRN: [id number]  HPI: Michaela Shaw is a 74 y.o.-year-old female, returning for f/u for DM2, dx 2005, insulin-dependent since 2007, uncontrolled, with complications (cerebrovascular disease - h/o stroke in 2009, CKD, DR, PN). Last visit 2 mo ago.  Since last visit, she was dx 'ed with stage 4 NSCLC - metastatic to bone and adrenals. She is undergoing chemotx. Sugars in the 300s when on Dexamethasone.  Last hemoglobin A1c was: Lab Results  Component Value Date   HGBA1C 7.3* 09/10/2014   HGBA1C 8.7* 06/11/2014   HGBA1C 8.0* 03/14/2014   She is on a DM study med - once a week.  She used a VGo20 mechanical pump - now not covered by insurance. 3 clicks per smaller meal. 4 clicks for a larger meal. Tradjenta 5 mg in am. Glipizide 5 mg bid - started 04/2014, then increased to 10 mg bid.  Since last visit, due to cost reasons, we switched to: lease change the insulin regimen as follows: Insulin Before breakfast Before lunch Before dinner  Regular 10 - smaller meal 12 - larger meal 10 - smaller meal 12 - larger meal 10 - smaller meal 12 - larger meal  NPH 20 >> 10  10    Pt checks her sugars 1-3x a day and they are fluctuating: - am: 27, 35, 39 - 257 >> 71, 76-91, 150 this am >> 89-298 >> 82-205, 216, 276 >> 54, 61, 66-150, 186 - 2h after b'fast: n/c >> 44, 196-HI >> 138-347 >> 91-341, 435 - before lunch: 126 >> 160-230 >> 297-411 >> n/c >> 104-288 - 2h after lunch: n/c >> 229-429 >> 117, 164-257, 350 >> 111-222 - before dinner: n/c >> 160-230 >> 84, 121-485 >> 83-160, 334 >> 97-141, 341 - 2h after dinner: n/c >> 119-450 >> 111-210, 363 >> 86, 145-313 - bedtime: n/c >> 174-311 >> 191 Had 2x lows in am. Lowest sugar was 54 since last visit; No hypoglycemia awareness! Highest sugar was 400s  Pt's meals are: - Breakfast:1/2 bagel - Lunch: 1/2 bagel - Dinner: chicken + veggies - Snacks: 1  - Has CKD, last BUN/creatinine:   Lab Results  Component Value Date   BUN 14.3 09/26/2014   CREATININE 1.1 09/26/2014   - last set of lipids: Lab Results  Component Value Date   CHOL 153 03/14/2014   HDL 33.50* 03/14/2014   LDLCALC NOT CALC 12/11/2013   LDLDIRECT 84.8 03/14/2014   TRIG 259.0* 03/14/2014   CHOLHDL 5 03/14/2014  She is on Crestor, Tricor. - She has + DR. Last eye exam: 05/2014. - + numbness and tingling in her feet.  I reviewed pt's medications, allergies, PMH, social hx, family hx, and changes were documented in the history of present illness. Otherwise, unchanged from my initial visit note.  ROS: Constitutional: no weight loss, no fatigue, + lack of appetite Eyes: no blurry vision, no xerophthalmia ENT: no sore throat, no nodules palpated in throat, no dysphagia/odynophagia, no hoarseness Cardiovascular: no CP/SOB/palpitations/leg swelling Respiratory: no cough/SOB Gastrointestinal: no N/V/D/C Musculoskeletal: no muscle/joint aches Skin: no rashes Neurological: no tremors/numbness/tingling/dizziness  PE: BP 134/68 mmHg  Pulse 102  Temp(Src) 98.7 F (37.1 C) (Oral)  Resp 12  Wt 123 lb 6.4 oz (55.974 kg)  SpO2 96%  LMP 07/06/1978 Body mass index is 24.91 kg/(m^2).   Wt Readings from Last 3 Encounters:  10/01/14 123 lb 6.4 oz (55.974 kg)  09/19/14 131 lb (59.421  kg)  09/13/14 133 lb (60.328 kg)   Constitutional: normal weight, in NAD Eyes: PERRLA, EOMI, no exophthalmos ENT: moist mucous membranes, no thyromegaly, no cervical lymphadenopathy Cardiovascular: tachycardia, RR, No MRG Respiratory: CTA B Gastrointestinal: abdomen soft, NT, ND, BS+ Musculoskeletal: no deformities, strength intact in all 4 Skin: moist, warm, no rashes Neurological: no tremor with outstretched hands, DTR normal in all 4  ASSESSMENT: 1. DM2, insulin-dependent, uncontrolled, with complications - cerebrovascular disease - h/o stroke in 2009 - CKD - DR - PN  PLAN:  1. Patient with long-standing,  uncontrolled diabetes, now improved control on basal-bolus regimen with insulin N and R. However, she was recently dx'ed with stage 4 lung cancer, now on ChTx along with high doses of Dexamethasone. Sugars high(300s) while on the Dex, and still high even after finishing course. Will increase her insulins. I advised her to d/w her oncologist to see if the Dex dose can be reduced or skipped. If not, I gave her another regimen to use during the Dex days. - I advised her to: Patient Instructions   Please change the insulin regimen as follows: Insulin Before breakfast Before lunch Before dinner  Regular 12 - smaller meal 14 - larger meal 12 - smaller meal 14 - larger meal 12 - smaller meal 14 - larger meal  NPH 20  10   In the days that you are taking Dexamethasone, increase the insulin as follows: Insulin Before breakfast Before lunch Before dinner  Regular 14 - smaller meal 18 - larger meal 14 - smaller meal 18 - larger meal 14 - smaller meal 18 - larger meal  NPH 20  10   Please return in 1.5 month with your sugar log.  - continue checking sugars at different times of the day - check 3 times a day, rotating checks - she is UTD with yearly eye exams - Return to clinic in 1.5 mo with sugar log

## 2014-10-01 NOTE — Patient Instructions (Signed)
Please change the insulin regimen as follows: Insulin Before breakfast Before lunch Before dinner  Regular 12 - smaller meal 14 - larger meal 12 - smaller meal 14 - larger meal 12 - smaller meal 14 - larger meal  NPH 20  10   In the days that you are taking Dexamethasone, increase the insulin as follows: Insulin Before breakfast Before lunch Before dinner  Regular 14 - smaller meal 18 - larger meal 14 - smaller meal 18 - larger meal 14 - smaller meal 18 - larger meal  NPH 20  10   Please return in 1.5 month with your sugar log.

## 2014-10-01 NOTE — Telephone Encounter (Signed)
Pt notified of Dr Worthy Flank note.

## 2014-10-01 NOTE — Telephone Encounter (Signed)
Patient confirm appointment  for 03/31

## 2014-10-03 ENCOUNTER — Other Ambulatory Visit: Payer: Medicare Other

## 2014-10-03 ENCOUNTER — Ambulatory Visit: Payer: Medicare Other

## 2014-10-03 LAB — GUARDANT 360

## 2014-10-04 ENCOUNTER — Encounter: Payer: Self-pay | Admitting: Physician Assistant

## 2014-10-04 ENCOUNTER — Telehealth: Payer: Self-pay | Admitting: Physician Assistant

## 2014-10-04 ENCOUNTER — Ambulatory Visit (HOSPITAL_BASED_OUTPATIENT_CLINIC_OR_DEPARTMENT_OTHER): Payer: Medicare Other | Admitting: Physician Assistant

## 2014-10-04 ENCOUNTER — Other Ambulatory Visit (HOSPITAL_BASED_OUTPATIENT_CLINIC_OR_DEPARTMENT_OTHER): Payer: Medicare Other

## 2014-10-04 ENCOUNTER — Ambulatory Visit (HOSPITAL_BASED_OUTPATIENT_CLINIC_OR_DEPARTMENT_OTHER): Payer: Medicare Other

## 2014-10-04 ENCOUNTER — Telehealth: Payer: Self-pay | Admitting: *Deleted

## 2014-10-04 ENCOUNTER — Ambulatory Visit: Payer: Medicare Other | Admitting: Nutrition

## 2014-10-04 VITALS — BP 154/72 | HR 92 | Temp 97.9°F | Resp 18 | Ht 59.0 in | Wt 127.0 lb

## 2014-10-04 DIAGNOSIS — C3491 Malignant neoplasm of unspecified part of right bronchus or lung: Secondary | ICD-10-CM

## 2014-10-04 DIAGNOSIS — Z5111 Encounter for antineoplastic chemotherapy: Secondary | ICD-10-CM | POA: Diagnosis not present

## 2014-10-04 DIAGNOSIS — C3490 Malignant neoplasm of unspecified part of unspecified bronchus or lung: Secondary | ICD-10-CM

## 2014-10-04 DIAGNOSIS — C7972 Secondary malignant neoplasm of left adrenal gland: Secondary | ICD-10-CM

## 2014-10-04 DIAGNOSIS — C7951 Secondary malignant neoplasm of bone: Secondary | ICD-10-CM | POA: Diagnosis not present

## 2014-10-04 DIAGNOSIS — N289 Disorder of kidney and ureter, unspecified: Secondary | ICD-10-CM

## 2014-10-04 LAB — COMPREHENSIVE METABOLIC PANEL (CC13)
ALBUMIN: 3.4 g/dL — AB (ref 3.5–5.0)
ALK PHOS: 79 U/L (ref 40–150)
ALT: 16 U/L (ref 0–55)
AST: 18 U/L (ref 5–34)
Anion Gap: 13 mEq/L — ABNORMAL HIGH (ref 3–11)
BUN: 19.5 mg/dL (ref 7.0–26.0)
CHLORIDE: 100 meq/L (ref 98–109)
CO2: 22 mEq/L (ref 22–29)
Calcium: 10.1 mg/dL (ref 8.4–10.4)
Creatinine: 1.2 mg/dL — ABNORMAL HIGH (ref 0.6–1.1)
EGFR: 44 mL/min/{1.73_m2} — ABNORMAL LOW (ref >90)
Glucose: 255 mg/dl — ABNORMAL HIGH (ref 70–140)
POTASSIUM: 4.4 meq/L (ref 3.5–5.1)
Sodium: 135 mEq/L — ABNORMAL LOW (ref 136–145)
Total Bilirubin: 0.22 mg/dL (ref 0.20–1.20)
Total Protein: 7.1 g/dL (ref 6.4–8.3)

## 2014-10-04 LAB — CBC WITH DIFFERENTIAL/PLATELET
BASO%: 0.2 % (ref 0.0–2.0)
BASOS ABS: 0 10*3/uL (ref 0.0–0.1)
EOS%: 0 % (ref 0.0–7.0)
Eosinophils Absolute: 0 10*3/uL (ref 0.0–0.5)
HEMATOCRIT: 29.4 % — AB (ref 34.8–46.6)
HGB: 9.7 g/dL — ABNORMAL LOW (ref 11.6–15.9)
LYMPH#: 0.7 10*3/uL — AB (ref 0.9–3.3)
LYMPH%: 7.2 % — ABNORMAL LOW (ref 14.0–49.7)
MCH: 27.3 pg (ref 25.1–34.0)
MCHC: 33.1 g/dL (ref 31.5–36.0)
MCV: 82.4 fL (ref 79.5–101.0)
MONO#: 0.2 10*3/uL (ref 0.1–0.9)
MONO%: 2.3 % (ref 0.0–14.0)
NEUT#: 9.2 10*3/uL — ABNORMAL HIGH (ref 1.5–6.5)
NEUT%: 90.3 % — AB (ref 38.4–76.8)
PLATELETS: 706 10*3/uL — AB (ref 145–400)
RBC: 3.57 10*6/uL — ABNORMAL LOW (ref 3.70–5.45)
RDW: 14.7 % — ABNORMAL HIGH (ref 11.2–14.5)
WBC: 10.2 10*3/uL (ref 3.9–10.3)

## 2014-10-04 MED ORDER — SODIUM CHLORIDE 0.9 % IV SOLN
Freq: Once | INTRAVENOUS | Status: AC
  Administered 2014-10-04: 15:00:00 via INTRAVENOUS

## 2014-10-04 MED ORDER — SODIUM CHLORIDE 0.9 % IV SOLN
Freq: Once | INTRAVENOUS | Status: AC
  Administered 2014-10-04: 16:00:00 via INTRAVENOUS
  Filled 2014-10-04: qty 8

## 2014-10-04 MED ORDER — PEMETREXED DISODIUM CHEMO INJECTION 500 MG
390.0000 mg/m2 | Freq: Once | INTRAVENOUS | Status: AC
  Administered 2014-10-04: 600 mg via INTRAVENOUS
  Filled 2014-10-04: qty 24

## 2014-10-04 MED ORDER — SODIUM CHLORIDE 0.9 % IV SOLN
317.0000 mg | Freq: Once | INTRAVENOUS | Status: AC
  Administered 2014-10-04: 320 mg via INTRAVENOUS
  Filled 2014-10-04: qty 32

## 2014-10-04 NOTE — Progress Notes (Signed)
74 year old female diagnosed with non-small cell lung cancer.  She is a patient of Dr. Earlie Server.  Past medical history includes chickenpox, diabetes, hyperlipidemia, hypertension, seizures, stroke, GERD, and chronic kidney disease.  Medications include coenzyme Q, Decadron, TriCor, ferrous sulfate, Humalog.  Labs include sodium 135, glucose 255, creatinine 1.2 and albumin 3.4 on March 31.  Height: 4 feet 11 inches. Weight: 127 pounds March 31. Usual body weight: 141 pounds November 2015. BMI: 25.64.  Patient struggling with glycemic control.  Patient has been to [facility].D. who is adjusting medication. Patient reports she has no appetite.  She rarely will eat. She has tried boost glucose control and likes it.  Patient denies nutrition impact symptoms such as nausea, vomiting, constipation or diarrhea.  Nutrition diagnosis: Food and nutrition related knowledge deficit related to diagnosis of lung cancer as evidenced by no prior need for nutrition related information.  Intervention: Patient educated on the importance of increasing oral intake utilizing high calorie, high protein carbohydrate-controlled foods. Recommended patient drink oral nutrition supplements 3 times a day Samples were provided along with coupons. Educated patient on sources of protein and provided fact sheet on increasing calories and protein. Questions were answered and teach back method used.  Monitoring, evaluation, goals: Patient will tolerate increased calories and protein to minimize further weight loss and improve glycemic control.  Next visit: Patient will call me with questions or concerns.  **Disclaimer: This note was dictated with voice recognition software. Similar sounding words can inadvertently be transcribed and this note may contain transcription errors which may not have been corrected upon publication of note.**

## 2014-10-04 NOTE — Progress Notes (Addendum)
No images are attached to the encounter. No scans are attached to the encounter. No scans are attached to the encounter. [facility] SHARED VISIT PROGRESS NOTE  [name], NP [address] [place] [address]  DIAGNOSIS: Non-small cell carcinoma of lung, stage 4   Staging form: Lung, AJCC 7th Edition     Clinical stage from 09/06/2014: Stage IV (T2a, N2, M1b) - Signed by Curt Bears, MD on 09/06/2014       Staging comments: Adenocarcinoma  Positive for BRAF  PRIOR THERAPY: none   CURRENT THERAPY: Systemic chemotherapy with carboplatin for an AUC of 5 and Alimta at 400 mg/m given every 3 weeks. Status post 1 cycle.  INTERVAL HISTORY: Michaela Shaw 74 y.o. female returns for a scheduled regular symptom management visit for followup of her recently diagnosed stage IV non-small cell lung cancer, adenocarcinoma. She is currently being treated with systemic chemotherapy in the form of carboplatin and Alimta. She is having significantly elevated blood sugar readings related to the dexamethasone. Her blood sugars have been greater than 420 and her endocrinologist is wondering if she can do with less dexamethasone. She reports a dry cough over the past 3 weeks that is slowly getting better. She is utilizing cough drops and diabetic tests and cough syrup for symptom management with good results. She denied fever, chills, nausea, vomiting, diarrhea or constipation. She's had no significant weight loss or night sweats. She presents to proceed with cycle #2.  MEDICAL HISTORY: Past Medical History  Diagnosis Date  . History of chicken pox   . Diabetes mellitus without complication   . Hyperlipidemia   . Hypertension   . 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 Seizures 2005  . Stroke 2011    "mild" per pt.   Marland Kitchen GERD (gastroesophageal reflux disease)   . Leg swelling     Left leg; cause is unknown; however pt  stated it goes away at rest  . Impaired circulation of right leg     Takes Pletal  . CKD (chronic kidney disease)     ALLERGIES:  is allergic to metformin and related.  MEDICATIONS:  Current Outpatient Prescriptions  Medication Sig Dispense Refill  . alendronate (FOSAMAX) 70 MG tablet Take 1 tablet (70 mg total) by mouth every 7 (seven) days. Take with a full glass of water on an empty stomach. 4 tablet 5  . amLODipine (NORVASC) 10 MG tablet Take 10 mg by mouth daily.     Marland Kitchen aspirin 81 MG tablet Take 81 mg by mouth daily.    . Blood Glucose Monitoring Suppl (ONETOUCH VERIO) W/DEVICE KIT 1 each by Does not apply route daily. Please use to test blood sugar 4 times as instructed. Dx code: E11.40 1 kit 0  . Calcium Carbonate-Vitamin D (CALCIUM 600 + D PO) Take 1 tablet by mouth every morning.    . cilostazol (PLETAL) 100 MG tablet Take 1 tablet (100 mg total) by mouth 2 (two) times daily before a meal. 60 tablet 11  . cloNIDine (CATAPRES) 0.2 MG tablet TAKE ONE TABLET BY MOUTH TWICE DAILY 60 tablet 5  . co-enzyme Q-10 30 MG capsule Take 100 mg by mouth daily.    Marland Kitchen dexamethasone (DECADRON) 4 MG tablet 4 mg by mouth twice a day the day before, day of and day after the chemotherapy every 3 weeks 40 tablet 1  . fenofibrate (TRICOR) 145 MG tablet TAKE ONE TABLET BY  MOUTH ONCE DAILY 30 tablet 1  . ferrous sulfate 325 (65 FE) MG tablet Take 325 mg by mouth 3 (three) times daily with meals.     . folic acid (FOLVITE) 1 MG tablet Take 1 tablet (1 mg total) by mouth daily. 30 tablet 3  . glucose blood (ONETOUCH VERIO) test strip Use to test blood sugar 4 times daily as instructed. Dx code: E11.40 200 each 5  . insulin lispro (HUMALOG) 100 UNIT/ML injection To use in VGo 20 (up to 60 units daily) (Patient taking differently: Inject 10-12 units in the morning, 10-12 units at lunch and 10-12 units at supper) 60 mL 3  . insulin NPH Human (HUMULIN N,NOVOLIN N) 100 UNIT/ML injection Inject 15 units into the skin  before breakfast, mixed with the Regular insulin and 10 units into the skin before dinner, mixed with the Regular insulin. (Patient taking differently: Inject 20 units into the skin before breakfast,  10 units at supper) 10 mL 2  . Insulin Syringe-Needle U-100 (RELION INSULIN SYR 0.3CC/30G) 30G X 5/16" 0.3 ML MISC Inject 1 each into the skin 2 (two) times daily.    . Insulin Syringe-Needle U-100 31G X 5/16" 0.5 ML MISC Use to inject insulin 3 times daily. 100 each 11  . omeprazole (PRILOSEC) 20 MG capsule TAKE ONE CAPSULE BY MOUTH IN THE MORNING 30 capsule 3  . ONETOUCH DELICA LANCETS 07H MISC Use to test blood sugar 4 times daily as instructed. 200 each 5  . potassium chloride SA (K-DUR,KLOR-CON) 20 MEQ tablet Take 1 tablet (20 mEq total) by mouth daily. 30 tablet 3  . prochlorperazine (COMPAZINE) 10 MG tablet Take 1 tablet (10 mg total) by mouth every 6 (six) hours as needed for nausea or vomiting. 60 tablet 0  . rosuvastatin (CRESTOR) 20 MG tablet Take 1 tablet (20 mg total) by mouth daily. 90 tablet 3   No current facility-administered medications for this visit.   Facility-Administered Medications Ordered in Other Visits  Medication Dose Route Frequency Provider Last Rate Last Dose  . CARBOplatin (PARAPLATIN) 320 mg in sodium chloride 0.9 % 100 mL chemo infusion  320 mg Intravenous Once Curt Bears, MD      . ondansetron (ZOFRAN) 16 mg, dexamethasone (DECADRON) 20 mg in sodium chloride 0.9 % 50 mL IVPB   Intravenous Once Curt Bears, MD      . PEMEtrexed (ALIMTA) 600 mg in sodium chloride 0.9 % 100 mL chemo infusion  390 mg/m2 (Treatment Plan Actual) Intravenous Once Curt Bears, MD        SURGICAL HISTORY:  Past Surgical History  Procedure Laterality Date  . Bladder suspension  1980  . Abdominal hysterectomy  1980    partial  . Cholecystectomy  2007  . Appendectomy  2007  . Spine surgery  2012    Has rods and pins  . Cataract extraction, bilateral    . Colonoscopy    .  Upper gi endoscopy    . Video bronchoscopy with endobronchial ultrasound N/A 08/23/2014    Procedure: VIDEO BRONCHOSCOPY WITH ENDOBRONCHIAL ULTRASOUND with Biopsy;  Surgeon: Rigoberto Noel, MD;  Location: [facility];  Service: Thoracic;  Laterality: N/A;    REVIEW OF SYSTEMS:  Review of Systems  Constitutional: Negative for fever, chills, weight loss, malaise/fatigue and diaphoresis.  HENT: Negative for congestion, ear discharge, ear pain, hearing loss, nosebleeds, sore throat and tinnitus.   Eyes: Negative for blurred vision, double vision, photophobia, pain, discharge and redness.  Respiratory: Positive for cough.  Negative for hemoptysis, sputum production, shortness of breath, wheezing and stridor.   Cardiovascular: Negative for chest pain, palpitations, orthopnea, claudication, leg swelling and PND.  Gastrointestinal: Negative for heartburn, nausea, vomiting, abdominal pain, diarrhea, constipation, blood in stool and melena.  Genitourinary: Negative.   Musculoskeletal: Negative.   Skin: Negative.   Neurological: Negative for dizziness, tingling, focal weakness, seizures, weakness and headaches.  Endo/Heme/Allergies: Does not bruise/bleed easily.  Psychiatric/Behavioral: Negative for depression. The patient is not nervous/anxious and does not have insomnia.      PHYSICAL EXAMINATION: Physical Exam  Constitutional: She is oriented to person, place, and time and well-developed, well-nourished, and in no distress.  HENT:  Head: Normocephalic and atraumatic.  Mouth/Throat: Oropharynx is clear and moist.  Eyes: Pupils are equal, round, and reactive to light.  Neck: Normal range of motion. Neck supple. No JVD present. No tracheal deviation present. No thyromegaly present.  Cardiovascular: Normal rate, regular rhythm, normal heart sounds and intact distal pulses.  Exam reveals no gallop and no friction rub.   No murmur heard. Pulmonary/Chest: Effort normal and breath sounds normal. No respiratory  distress. She has no wheezes. She has no rales.  Abdominal: Soft. Bowel sounds are normal. She exhibits no distension and no mass. There is no tenderness.  Musculoskeletal: Normal range of motion. She exhibits no edema or tenderness.  Lymphadenopathy:    She has no cervical adenopathy.  Neurological: She is alert and oriented to person, place, and time. She has normal reflexes. Gait normal.  Skin: Skin is warm and dry. No rash noted.    ECOG PERFORMANCE STATUS: 0 - Asymptomatic  Blood pressure 154/72, pulse 92, temperature 97.9 F (36.6 C), temperature source Oral, resp. rate 18, height _0  (1.499 m), weight 127 lb (57.607 kg), last menstrual period 07/06/1978, SpO2 99 %.  LABORATORY DATA: Lab Results  Component Value Date   WBC 10.2 10/04/2014   HGB 9.7* 10/04/2014   HCT 29.4* 10/04/2014   MCV 82.4 10/04/2014   PLT 706* 10/04/2014      Chemistry      Component Value Date/Time   NA 135* 10/04/2014 1345   NA 136 08/20/2014 0843   K 4.4 10/04/2014 1345   K 4.1 08/20/2014 0843   CL 103 08/20/2014 0843   CO2 22 10/04/2014 1345   CO2 26 08/20/2014 0843   BUN 19.5 10/04/2014 1345   BUN 17 08/20/2014 0843   CREATININE 1.2* 10/04/2014 1345   CREATININE 1.73* 08/20/2014 0843   CREATININE 1.61* 12/11/2013 1043      Component Value Date/Time   CALCIUM 10.1 10/04/2014 1345   CALCIUM 9.4 08/20/2014 0843   ALKPHOS 79 10/04/2014 1345   ALKPHOS 54 05/04/2014 1033   AST 18 10/04/2014 1345   AST 21 05/04/2014 1033   ALT 16 10/04/2014 1345   ALT 12 05/04/2014 1033   BILITOT 0.22 10/04/2014 1345   BILITOT 0.6 05/04/2014 1033       RADIOGRAPHIC STUDIES:  Mr Brain Wo Contrast  09/14/2014   CLINICAL DATA:  Stage IV non-small cell adenocarcinoma of the right lung.  EXAM: MRI HEAD WITHOUT CONTRAST  TECHNIQUE: Multiplanar, multiecho pulse sequences of the brain and surrounding structures were obtained without intravenous contrast.  COMPARISON:  MRI brain 01/28/2010  FINDINGS: A  remote lacunar infarct is present within the left caudate head. There smaller remote lacunar infarcts in the right lentiform nucleus in bilateral thalami. Mild periventricular white matter changes bilaterally advanced. There is mild diffuse atrophy.  A remote infarct  is noted within the right caudate head and pons.  No acute infarct, hemorrhage, or mass lesion is present. There is no evidence for metastatic disease the brain.  Contrast could not be given as the patient's GFR was under 30. No definite metastatic lesions are evident.  The skullbase is within normal limits. Midline structures demonstrate a relatively empty sella. There is slight leftward deviate the shin of the pituitary infundibulum and down or distortion of the optic chiasm.  IMPRESSION: 1. No definite metastatic disease to the brain. IV contrast could not be given due to renal insufficiency. 2. Stable remote lacunar infarcts in the basal ganglia and right cerebral peduncle. 3. Stable atrophy and slight progression of periventricular white matter disease. 4. Stable appearance of enlarged empty sella. This could be related to a previous intracranial hypertension. No mass lesion is evident.   Electronically Signed   By: San Morelle M.D.   On: 09/14/2014 18:20     ASSESSMENT/PLAN:  No problem-specific assessment & plan notes found for this encounter.  patient is a very pleasant 74 year old Caucasian female diagnosed with stage IV disease T2a, N2, M1 B) non-small cell lung cancer, adenocarcinoma presenting with bilateral pulmonary nodules in addition to mediastinal lymphadenopathy and metastatic disease to the left renal gland and bones. This was diagnosed in February 2016. She's currently being treated with systemic chemotherapy in the form of carboplatin for an AUC of 5 and Alimta 400 milligrams per meter squared (due to her renal function) given every 3 weeks. Status post 1 cycle. Patient was discussed with and also seen by Dr. Julien Nordmann.  Regarding her elevated blood sugars related to the dexamethasone, we will have her decrease her dexamethasone to 4 mg taken once daily the day before, the day of, and the day after chemotherapy. She will continue to monitor her blood sugars closely. She will proceed with cycle #2 of her systemic chemotherapy today as scheduled and follow-up in 3 weeks prior to the start of cycle #3. She will continue with weekly labs in the interim.  Awilda Metro E, PA-C 10/04/2014  All questions were answered. The patient knows to call the clinic with any problems, questions or concerns. We can certainly see the patient much sooner if necessary.  ADDENDUM: Hematology/Oncology Attending: I had a face to face encounter with the patient. I recommended her care plan. This is a very pleasant 74 years old white female recently diagnosed with metastatic non-small cell lung cancer, adenocarcinoma with positive BRAF mutation, but negative EGFR mutation and negative ALK gene translocation. The patient is currently undergoing systemic chemotherapy with carboplatin and Alimta status post 1 cycle. She tolerated the first cycle of her treatment fairly well with no significant adverse effects. I recommended for the patient to proceed with cycle #2 today as a scheduled but we will reduce the dose of Alimta to 400 MG/M2 secondary to her renal insufficiency. The patient would come back for follow-up visit in 3 weeks for reevaluation before starting the next cycle of her treatment. She was advised to call immediately if she has any concerning symptoms in the interval.  Disclaimer: This note was dictated with voice recognition software. Similar sounding words can inadvertently be transcribed and may be missed upon review. Eilleen Kempf., MD 10/09/2014

## 2014-10-04 NOTE — Patient Instructions (Signed)
[facility] Discharge Instructions for Patients Receiving Chemotherapy  Today you received the following chemotherapy agents: Alimta/Carboplatin  To help prevent nausea and vomiting after your treatment, we encourage you to take your nausea medication as directed.   If you develop nausea and vomiting that is not controlled by your nausea medication, call the clinic.   BELOW ARE SYMPTOMS THAT SHOULD BE REPORTED IMMEDIATELY:  *FEVER GREATER THAN 100.5 F  *CHILLS WITH OR WITHOUT FEVER  NAUSEA AND VOMITING THAT IS NOT CONTROLLED WITH YOUR NAUSEA MEDICATION  *UNUSUAL SHORTNESS OF BREATH  *UNUSUAL BRUISING OR BLEEDING  TENDERNESS IN MOUTH AND THROAT WITH OR WITHOUT PRESENCE OF ULCERS  *URINARY PROBLEMS  *BOWEL PROBLEMS  UNUSUAL RASH Items with * indicate a potential emergency and should be followed up as soon as possible.  Feel free to call the clinic you have any questions or concerns. The clinic phone number is [phone].  Please show the CHEMO ALERT CARD at check-in to the Emergency Department and triage nurse.   

## 2014-10-04 NOTE — Telephone Encounter (Signed)
Error

## 2014-10-04 NOTE — Telephone Encounter (Signed)
Per staff message and POF I have scheduled appts. Advised scheduler of appts. JMW  

## 2014-10-04 NOTE — Telephone Encounter (Signed)
Gave avs & calendar for April/May. Sent message to schedule treatment.

## 2014-10-05 ENCOUNTER — Encounter: Payer: Self-pay | Admitting: *Deleted

## 2014-10-05 LAB — AFB CULTURE WITH SMEAR (NOT AT ARMC): Acid Fast Smear: NONE SEEN

## 2014-10-05 NOTE — [facility] Oncology Navigator Note (Signed)
Patient at [facility] for chemotherapy treatment.  I checked in with patient during her infusion.  She reports that she is doing well.  She denies any questions or concerns at this time.  I gave her my contact information and encouraged her to call for any needs.

## 2014-10-08 NOTE — Patient Instructions (Signed)
Continue weekly labs as scheduled Follow-up in 3 weeks prior to next scheduled cycle of chemotherapy

## 2014-10-09 ENCOUNTER — Telehealth: Payer: Self-pay | Admitting: Internal Medicine

## 2014-10-09 NOTE — Telephone Encounter (Signed)
returned call and left voicemail for pt to call back to r/s appt with d.t. needed.Marland KitchenMarland Kitchen

## 2014-10-09 NOTE — Telephone Encounter (Signed)
returned call and r/s 4.6 appt to afternoon per pt request....pt ok and aware

## 2014-10-10 ENCOUNTER — Other Ambulatory Visit: Payer: Self-pay | Admitting: Physician Assistant

## 2014-10-10 ENCOUNTER — Telehealth: Payer: Self-pay

## 2014-10-10 ENCOUNTER — Other Ambulatory Visit: Payer: Medicare Other

## 2014-10-10 ENCOUNTER — Other Ambulatory Visit (HOSPITAL_BASED_OUTPATIENT_CLINIC_OR_DEPARTMENT_OTHER): Payer: Medicare Other

## 2014-10-10 DIAGNOSIS — C7970 Secondary malignant neoplasm of unspecified adrenal gland: Secondary | ICD-10-CM

## 2014-10-10 DIAGNOSIS — C3491 Malignant neoplasm of unspecified part of right bronchus or lung: Secondary | ICD-10-CM | POA: Diagnosis not present

## 2014-10-10 DIAGNOSIS — C3490 Malignant neoplasm of unspecified part of unspecified bronchus or lung: Secondary | ICD-10-CM

## 2014-10-10 DIAGNOSIS — R059 Cough, unspecified: Secondary | ICD-10-CM

## 2014-10-10 DIAGNOSIS — R05 Cough: Secondary | ICD-10-CM

## 2014-10-10 LAB — CBC WITH DIFFERENTIAL/PLATELET
BASO%: 0.3 % (ref 0.0–2.0)
BASOS ABS: 0 10*3/uL (ref 0.0–0.1)
EOS ABS: 0.1 10*3/uL (ref 0.0–0.5)
EOS%: 2.7 % (ref 0.0–7.0)
HCT: 32.1 % — ABNORMAL LOW (ref 34.8–46.6)
HEMOGLOBIN: 10.7 g/dL — AB (ref 11.6–15.9)
LYMPH%: 31.9 % (ref 14.0–49.7)
MCH: 27.6 pg (ref 25.1–34.0)
MCHC: 33.3 g/dL (ref 31.5–36.0)
MCV: 82.7 fL (ref 79.5–101.0)
MONO#: 0 10*3/uL — AB (ref 0.1–0.9)
MONO%: 0.5 % (ref 0.0–14.0)
NEUT%: 64.6 % (ref 38.4–76.8)
NEUTROS ABS: 2.4 10*3/uL (ref 1.5–6.5)
Platelets: 473 10*3/uL — ABNORMAL HIGH (ref 145–400)
RBC: 3.88 10*6/uL (ref 3.70–5.45)
RDW: 13.6 % (ref 11.2–14.5)
WBC: 3.8 10*3/uL — AB (ref 3.9–10.3)
lymph#: 1.2 10*3/uL (ref 0.9–3.3)

## 2014-10-10 LAB — COMPREHENSIVE METABOLIC PANEL (CC13)
ALBUMIN: 3.3 g/dL — AB (ref 3.5–5.0)
ALK PHOS: 84 U/L (ref 40–150)
ALT: 14 U/L (ref 0–55)
AST: 18 U/L (ref 5–34)
Anion Gap: 13 mEq/L — ABNORMAL HIGH (ref 3–11)
BUN: 17.8 mg/dL (ref 7.0–26.0)
CO2: 24 mEq/L (ref 22–29)
Calcium: 9.6 mg/dL (ref 8.4–10.4)
Chloride: 100 mEq/L (ref 98–109)
Creatinine: 1.2 mg/dL — ABNORMAL HIGH (ref 0.6–1.1)
EGFR: 44 mL/min/{1.73_m2} — ABNORMAL LOW (ref >90)
Glucose: 203 mg/dl — ABNORMAL HIGH (ref 70–140)
POTASSIUM: 4 meq/L (ref 3.5–5.1)
SODIUM: 137 meq/L (ref 136–145)
Total Bilirubin: 0.53 mg/dL (ref 0.20–1.20)
Total Protein: 7.1 g/dL (ref 6.4–8.3)

## 2014-10-10 MED ORDER — HYDROCODONE-HOMATROPINE 5-1.5 MG/5ML PO SYRP: 5.0000 mL | ORAL_SOLUTION | Freq: Four times a day (QID) | ORAL | Status: DC | PRN

## 2014-10-10 NOTE — Telephone Encounter (Signed)
Addendum note: pt was told Hycodan cough syrup will make her sleepy and she was made aware of keeping check on blood sugars.

## 2014-10-10 NOTE — Telephone Encounter (Signed)
Awilda Metro  PA, printed electronic prescription c/o Hycodan  cough syrup, print out given to patient and understood to keep check on blood sugars.

## 2014-10-17 ENCOUNTER — Other Ambulatory Visit (HOSPITAL_BASED_OUTPATIENT_CLINIC_OR_DEPARTMENT_OTHER): Payer: Medicare Other

## 2014-10-17 ENCOUNTER — Ambulatory Visit: Payer: Medicare Other

## 2014-10-17 ENCOUNTER — Other Ambulatory Visit: Payer: Self-pay | Admitting: Internal Medicine

## 2014-10-17 ENCOUNTER — Telehealth: Payer: Self-pay | Admitting: Medical Oncology

## 2014-10-17 VITALS — BP 167/90 | HR 115 | Temp 98.3°F

## 2014-10-17 DIAGNOSIS — T451X5A Adverse effect of antineoplastic and immunosuppressive drugs, initial encounter: Principal | ICD-10-CM

## 2014-10-17 DIAGNOSIS — C3491 Malignant neoplasm of unspecified part of right bronchus or lung: Secondary | ICD-10-CM

## 2014-10-17 DIAGNOSIS — D701 Agranulocytosis secondary to cancer chemotherapy: Secondary | ICD-10-CM

## 2014-10-17 DIAGNOSIS — D72819 Decreased white blood cell count, unspecified: Secondary | ICD-10-CM

## 2014-10-17 LAB — COMPREHENSIVE METABOLIC PANEL (CC13)
ALT: 25 U/L (ref 0–55)
ANION GAP: 14 meq/L — AB (ref 3–11)
AST: 19 U/L (ref 5–34)
Albumin: 3.2 g/dL — ABNORMAL LOW (ref 3.5–5.0)
Alkaline Phosphatase: 103 U/L (ref 40–150)
BILIRUBIN TOTAL: 0.47 mg/dL (ref 0.20–1.20)
BUN: 22.1 mg/dL (ref 7.0–26.0)
CO2: 26 meq/L (ref 22–29)
CREATININE: 1.1 mg/dL (ref 0.6–1.1)
Calcium: 10 mg/dL (ref 8.4–10.4)
Chloride: 99 mEq/L (ref 98–109)
EGFR: 48 mL/min/{1.73_m2} — ABNORMAL LOW (ref >90)
GLUCOSE: 330 mg/dL — AB (ref 70–140)
Potassium: 3.6 mEq/L (ref 3.5–5.1)
Sodium: 139 mEq/L (ref 136–145)
TOTAL PROTEIN: 7.1 g/dL (ref 6.4–8.3)

## 2014-10-17 LAB — CBC WITH DIFFERENTIAL/PLATELET
BASO%: 0 % (ref 0.0–2.0)
Basophils Absolute: 0 10*3/uL (ref 0.0–0.1)
EOS%: 3.7 % (ref 0.0–7.0)
Eosinophils Absolute: 0 10*3/uL (ref 0.0–0.5)
HCT: 28.9 % — ABNORMAL LOW (ref 34.8–46.6)
HGB: 9.7 g/dL — ABNORMAL LOW (ref 11.6–15.9)
LYMPH#: 0.7 10*3/uL — AB (ref 0.9–3.3)
LYMPH%: 80.5 % — AB (ref 14.0–49.7)
MCH: 27.3 pg (ref 25.1–34.0)
MCHC: 33.6 g/dL (ref 31.5–36.0)
MCV: 81.4 fL (ref 79.5–101.0)
MONO#: 0.1 10*3/uL (ref 0.1–0.9)
MONO%: 9.8 % (ref 0.0–14.0)
NEUT#: 0.1 10*3/uL — CL (ref 1.5–6.5)
NEUT%: 6 % — AB (ref 38.4–76.8)
Platelets: 50 10*3/uL — ABNORMAL LOW (ref 145–400)
RBC: 3.55 10*6/uL — ABNORMAL LOW (ref 3.70–5.45)
RDW: 12.8 % (ref 11.2–14.5)
WBC: 0.8 10*3/uL — CL (ref 3.9–10.3)
nRBC: 0 % (ref 0–0)

## 2014-10-17 MED ORDER — TBO-FILGRASTIM 300 MCG/0.5ML ~~LOC~~ SOSY
300.0000 ug | PREFILLED_SYRINGE | Freq: Once | SUBCUTANEOUS | Status: DC
  Filled 2014-10-17: qty 0.5

## 2014-10-17 NOTE — Telephone Encounter (Signed)
I left a message to call me back regarding low WBC

## 2014-10-17 NOTE — Patient Instructions (Signed)

## 2014-10-17 NOTE — Telephone Encounter (Signed)
I spoke to daughter and she will bring pt back for granix injections and is aware of appt .

## 2014-10-17 NOTE — Progress Notes (Signed)
Pt c/o sore throat and unable to swallow solid foods for the past week.  She has tolerated some liquids.  Michaela Mac, NP aware, asked for pt to be added to 10/18/14 schedule at 9:30 am.  Patient is aware.  Patient will go to ER tonight if symptoms are worse but is trying to avoid the ER due to white count.  Patient given a mask and neutropenic precautions explained. Urgent POF sent to scheduler.

## 2014-10-18 ENCOUNTER — Other Ambulatory Visit: Payer: Self-pay | Admitting: Nurse Practitioner

## 2014-10-18 ENCOUNTER — Encounter: Payer: Self-pay | Admitting: Nurse Practitioner

## 2014-10-18 ENCOUNTER — Ambulatory Visit (HOSPITAL_BASED_OUTPATIENT_CLINIC_OR_DEPARTMENT_OTHER): Payer: Medicare Other | Admitting: Nurse Practitioner

## 2014-10-18 ENCOUNTER — Ambulatory Visit (HOSPITAL_BASED_OUTPATIENT_CLINIC_OR_DEPARTMENT_OTHER): Payer: Medicare Other

## 2014-10-18 ENCOUNTER — Ambulatory Visit (HOSPITAL_COMMUNITY)
Admission: RE | Admit: 2014-10-18 | Discharge: 2014-10-18 | Disposition: A | Discharge status: Home / Self Care | Payer: Medicare Other | Source: Ambulatory Visit | Attending: Internal Medicine | Admitting: Internal Medicine

## 2014-10-18 VITALS — BP 178/85 | HR 109 | Temp 98.6°F

## 2014-10-18 VITALS — BP 178/85 | HR 117 | Temp 98.6°F | Resp 18 | Ht 59.0 in | Wt 116.0 lb

## 2014-10-18 DIAGNOSIS — T451X5A Adverse effect of antineoplastic and immunosuppressive drugs, initial encounter: Principal | ICD-10-CM

## 2014-10-18 DIAGNOSIS — K209 Esophagitis, unspecified without bleeding: Secondary | ICD-10-CM | POA: Insufficient documentation

## 2014-10-18 DIAGNOSIS — R531 Weakness: Secondary | ICD-10-CM

## 2014-10-18 DIAGNOSIS — E1149 Type 2 diabetes mellitus with other diabetic neurological complication: Secondary | ICD-10-CM

## 2014-10-18 DIAGNOSIS — C3491 Malignant neoplasm of unspecified part of right bronchus or lung: Secondary | ICD-10-CM | POA: Diagnosis not present

## 2014-10-18 DIAGNOSIS — C7951 Secondary malignant neoplasm of bone: Secondary | ICD-10-CM | POA: Diagnosis not present

## 2014-10-18 DIAGNOSIS — N189 Chronic kidney disease, unspecified: Secondary | ICD-10-CM

## 2014-10-18 DIAGNOSIS — D701 Agranulocytosis secondary to cancer chemotherapy: Secondary | ICD-10-CM

## 2014-10-18 DIAGNOSIS — E860 Dehydration: Secondary | ICD-10-CM | POA: Diagnosis not present

## 2014-10-18 DIAGNOSIS — E1165 Type 2 diabetes mellitus with hyperglycemia: Principal | ICD-10-CM

## 2014-10-18 DIAGNOSIS — R630 Anorexia: Secondary | ICD-10-CM | POA: Diagnosis not present

## 2014-10-18 DIAGNOSIS — D696 Thrombocytopenia, unspecified: Secondary | ICD-10-CM

## 2014-10-18 DIAGNOSIS — E1141 Type 2 diabetes mellitus with diabetic mononeuropathy: Secondary | ICD-10-CM

## 2014-10-18 DIAGNOSIS — C3490 Malignant neoplasm of unspecified part of unspecified bronchus or lung: Secondary | ICD-10-CM

## 2014-10-18 DIAGNOSIS — IMO0002 Reserved for concepts with insufficient information to code with codable children: Secondary | ICD-10-CM

## 2014-10-18 DIAGNOSIS — C7972 Secondary malignant neoplasm of left adrenal gland: Secondary | ICD-10-CM

## 2014-10-18 MED ORDER — SODIUM CHLORIDE 0.9 % IV SOLN
INTRAVENOUS | Status: AC
  Administered 2014-10-18: 13:00:00 via INTRAVENOUS

## 2014-10-18 MED ORDER — SUCRALFATE 1 GM/10ML PO SUSP: 1.0000 g | Freq: Three times a day (TID) | ORAL | Status: AC

## 2014-10-18 MED ORDER — MAGIC MOUTHWASH W/LIDOCAINE: 5.0000 mL | Freq: Four times a day (QID) | ORAL | Status: AC | PRN

## 2014-10-18 MED ORDER — TBO-FILGRASTIM 300 MCG/0.5ML ~~LOC~~ SOSY
300.0000 ug | PREFILLED_SYRINGE | Freq: Once | SUBCUTANEOUS | Status: AC
  Administered 2014-10-18: 300 ug via SUBCUTANEOUS
  Filled 2014-10-18: qty 0.5

## 2014-10-18 NOTE — Assessment & Plan Note (Signed)
Patient has a history of known uncontrolled diabetes.  Blood sugar today with lab draw was 330.  Patient reports she has not been taking any of her previously prescribed diabetic medications.  Patient was advised to go home and take her diabetic medications as prescribed.  She should also check her blood sugar often.

## 2014-10-18 NOTE — Progress Notes (Addendum)
Diagnosis: Non Small cell Carcinoma of lung stage 4 MD Name:Mohamed Procedure note: Received 1L IV fluids Normal Saline Condition during procedure: Tolerated well. Condition at discharge: Alert, oriented and ambulatory; no complaints

## 2014-10-18 NOTE — Assessment & Plan Note (Signed)
Patient received cycle 2 of her carboplatin/Alimta chemotherapy regimen on 10/04/2014.  She is scheduled for cycle 3 of the same regimen on 10/25/2014.

## 2014-10-18 NOTE — Assessment & Plan Note (Signed)
Labs drawn today reveal a WBC of 0.8 and an ANC of 0.1.  Patient has been ordered Granix 300 g subcutaneously for a total of 3 days.  She will receive her last Granix injection tomorrow.  She was once again reminded of all neutropenic precautions.  Patient was wearing a mask while at the [facility] today.

## 2014-10-18 NOTE — Assessment & Plan Note (Signed)
Patient continues to complain of sore throat/esophagitis symptoms.  Patient has had little oral intake; feels dehydrated.  Patient will receive 1 L normal saline IV fluid rehydration today.  She was also encouraged to push fluids is much as possible.

## 2014-10-18 NOTE — Assessment & Plan Note (Signed)
Patient complains of minimal appetite secondary to chronic esophagitis discomfort.  Patient was prescribed Carafate and Magic mouthwash with lidocaine.  She was also advised to increase her Prilosec to twice daily.  She was given Aquaphor for her lips and inside of her mouth as needed.  She was encouraged to eat multiple small meals and to push fluids as much as possible.

## 2014-10-18 NOTE — Progress Notes (Signed)
SYMPTOM MANAGEMENT CLINIC   HPI: Michaela Shaw 74 y.o. female diagnosed with lung cancer.  Currently undergoing carboplatin/Alimta chemotherapy regimen.  Patient complaining of chronic sore throats/esophagitis symptoms.  She states she has very poor appetite and little oral intake.  She feels dehydrated today.  She denies any recent fevers or chills.  HPI  ROS  Past Medical History  Diagnosis Date  . History of chicken pox   . Diabetes mellitus without complication   . Hyperlipidemia   . Hypertension   . 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 Seizures 2005  . Stroke 2011    "mild" per pt.   Marland Kitchen GERD (gastroesophageal reflux disease)   . Leg swelling     Left leg; cause is unknown; however pt stated it goes away at rest  . Impaired circulation of right leg     Takes Pletal  . CKD (chronic kidney disease)     Past Surgical History  Procedure Laterality Date  . Bladder suspension  1980  . Abdominal hysterectomy  1980    partial  . Cholecystectomy  2007  . Appendectomy  2007  . Spine surgery  2012    Has rods and pins  . Cataract extraction, bilateral    . Colonoscopy    . Upper gi endoscopy    . Video bronchoscopy with endobronchial ultrasound N/A 08/23/2014    Procedure: VIDEO BRONCHOSCOPY WITH ENDOBRONCHIAL ULTRASOUND with Biopsy;  Surgeon: Rigoberto Noel, MD;  Location: [facility];  Service: Thoracic;  Laterality: N/A;    has Type 2 diabetes mellitus with neurological manifestations, uncontrolled; HTN (hypertension); Other and unspecified hyperlipidemia; Diabetic retinopathy; Compression fracture; Early satiety; PVD (peripheral vascular disease); Chronic renal insufficiency; Anemia, iron deficiency; Leg swelling; Non-small cell carcinoma of lung, stage 4; Chemotherapy induced neutropenia; Anorexia; Dehydration; Esophagitis; Thrombocytopenia; and Weakness on her problem list.    is allergic to metformin and related.      Medication List       This list is accurate as of: 10/18/14  7:37 PM.  Always use your most recent med list.               alendronate 70 MG tablet  Commonly known as:  FOSAMAX  Take 1 tablet (70 mg total) by mouth every 7 (seven) days. Take with a full glass of water on an empty stomach.     amLODipine 10 MG tablet  Commonly known as:  NORVASC  Take 10 mg by mouth daily.     aspirin 81 MG tablet  Take 81 mg by mouth daily.     CALCIUM 600 + D PO  Take 1 tablet by mouth every morning.     cilostazol 100 MG tablet  Commonly known as:  PLETAL  Take 1 tablet (100 mg total) by mouth 2 (two) times daily before a meal.     cloNIDine 0.2 MG tablet  Commonly known as:  CATAPRES  TAKE ONE TABLET BY MOUTH TWICE DAILY     co-enzyme Q-10 30 MG capsule  Take 100 mg by mouth daily.     dexamethasone 4 MG tablet  Commonly known as:  DECADRON  4 mg by mouth twice a day the day before, day of and day after the chemotherapy every 3 weeks     fenofibrate 145 MG tablet  Commonly known as:  TRICOR  TAKE ONE TABLET BY MOUTH ONCE DAILY     ferrous sulfate 325 (65 FE) MG tablet  Take 325 mg by mouth 3 (three) times daily with meals.     folic acid 1 MG tablet  Commonly known as:  FOLVITE  Take 1 tablet (1 mg total) by mouth daily.     glucose blood test strip  Commonly known as:  ONETOUCH VERIO  Use to test blood sugar 4 times daily as instructed. Dx code: E11.40     HYDROcodone-homatropine 5-1.5 MG/5ML syrup  Commonly known as:  HYCODAN  Take 5 mLs by mouth every 6 (six) hours as needed for cough.     insulin lispro 100 UNIT/ML injection  Commonly known as:  HUMALOG  To use in VGo 20 (up to 60 units daily)     insulin NPH Human 100 UNIT/ML injection  Commonly known as:  HUMULIN N,NOVOLIN N  Inject 15 units into the skin before breakfast, mixed with the Regular insulin and 10 units into the skin before dinner, mixed with the Regular insulin.     magic mouthwash w/lidocaine  Soln  Take 5 mLs by mouth 4 (four) times daily as needed for mouth pain.     omeprazole 20 MG capsule  Commonly known as:  PRILOSEC  TAKE ONE CAPSULE BY MOUTH IN THE MORNING     ONETOUCH DELICA LANCETS 21J Misc  Use to test blood sugar 4 times daily as instructed.     ONETOUCH VERIO W/DEVICE Kit  1 each by Does not apply route daily. Please use to test blood sugar 4 times as instructed. Dx code: E11.40     potassium chloride SA 20 MEQ tablet  Commonly known as:  K-DUR,KLOR-CON  Take 1 tablet (20 mEq total) by mouth daily.     prochlorperazine 10 MG tablet  Commonly known as:  COMPAZINE  Take 1 tablet (10 mg total) by mouth every 6 (six) hours as needed for nausea or vomiting.     RELION INSULIN SYR 0.3CC/30G 30G X 5/16" 0.3 ML Misc  Generic drug:  Insulin Syringe-Needle U-100  Inject 1 each into the skin 2 (two) times daily.     Insulin Syringe-Needle U-100 31G X 5/16" 0.5 ML Misc  Use to inject insulin 3 times daily.     rosuvastatin 20 MG tablet  Commonly known as:  CRESTOR  Take 1 tablet (20 mg total) by mouth daily.     sucralfate 1 GM/10ML suspension  Commonly known as:  CARAFATE  Take 10 mLs (1 g total) by mouth 4 (four) times daily -  with meals and at bedtime.         PHYSICAL EXAMINATION  Oncology Vitals 10/18/2014 10/18/2014 10/17/2014 10/04/2014 10/01/2014 09/19/2014 09/13/2014  Height 150 cm - - 150 cm - 150 cm 150 cm  Weight 52.617 kg - - 57.607 kg 55.974 kg 59.421 kg 60.328 kg  Weight (lbs) 116 lbs - - 127 lbs 123 lbs 6 oz 131 lbs 133 lbs  BMI (kg/m2) 23.43 kg/m2 - - 25.65 kg/m2 - 26.46 kg/m2 26.86 kg/m2  Temp 98.6 98.6 98.3 97.9 98.7 98.2 97.8  Pulse 117 109 115 92 102 96 102  Resp 18 - - 18 12 18  -  SpO2 97 - - 99 96 - 95  BSA (m2) 1.48 m2 - - 1.55 m2 - 1.57 m2 1.58 m2   BP Readings from Last 3 Encounters:  10/18/14 178/85  10/18/14 178/85  10/17/14 167/90    Physical Exam  Constitutional: She is oriented to person, place, and time. She appears  dehydrated. She appears unhealthy.  Patient  appears fatigued, weak, frail, and chronically ill.  HENT:  Head: Normocephalic and atraumatic.  Right Ear: External ear normal.  Left Ear: External ear normal.  No oral lesions on exam.  Posterior oropharynx with erythema; but no exudate.  Eyes: Conjunctivae and EOM are normal. Pupils are equal, round, and reactive to light. Right eye exhibits no discharge. Left eye exhibits no discharge. No scleral icterus.  Neck: Normal range of motion. Neck supple. No JVD present. No tracheal deviation present. No thyromegaly present.  Cardiovascular: Normal heart sounds and intact distal pulses.   Tachycardic rate  Pulmonary/Chest: Effort normal and breath sounds normal. No respiratory distress. She has no wheezes. She has no rales. She exhibits no tenderness.  Abdominal: Soft. Bowel sounds are normal. She exhibits no distension and no mass. There is no tenderness. There is no rebound and no guarding.  Musculoskeletal: Normal range of motion. She exhibits no edema or tenderness.  Lymphadenopathy:    She has no cervical adenopathy.  Neurological: She is alert and oriented to person, place, and time.  Skin: Skin is warm and dry. No rash noted. No erythema. There is pallor.  Psychiatric: Affect normal.  Nursing note and vitals reviewed.   LABORATORY DATA:. Appointment on 10/17/2014  Component Date Value Ref Range Status  . WBC 10/17/2014 0.8* 3.9 - 10.3 10e3/uL Final  . NEUT# 10/17/2014 0.1* 1.5 - 6.5 10e3/uL Final  . HGB 10/17/2014 9.7* 11.6 - 15.9 g/dL Final  . HCT 10/17/2014 28.9* 34.8 - 46.6 % Final  . Platelets 10/17/2014 50* 145 - 400 10e3/uL Final  . MCV 10/17/2014 81.4  79.5 - 101.0 fL Final  . MCH 10/17/2014 27.3  25.1 - 34.0 pg Final  . MCHC 10/17/2014 33.6  31.5 - 36.0 g/dL Final  . RBC 10/17/2014 3.55* 3.70 - 5.45 10e6/uL Final  . RDW 10/17/2014 12.8  11.2 - 14.5 % Final  . lymph# 10/17/2014 0.7* 0.9 - 3.3 10e3/uL Final  . MONO# 10/17/2014  0.1  0.1 - 0.9 10e3/uL Final  . Eosinophils Absolute 10/17/2014 0.0  0.0 - 0.5 10e3/uL Final  . Basophils Absolute 10/17/2014 0.0  0.0 - 0.1 10e3/uL Final  . NEUT% 10/17/2014 6.0* 38.4 - 76.8 % Final  . LYMPH% 10/17/2014 80.5* 14.0 - 49.7 % Final  . MONO% 10/17/2014 9.8  0.0 - 14.0 % Final  . EOS% 10/17/2014 3.7  0.0 - 7.0 % Final  . BASO% 10/17/2014 0.0  0.0 - 2.0 % Final  . nRBC 10/17/2014 0  0 - 0 % Final  . Sodium 10/17/2014 139  136 - 145 mEq/L Final  . Potassium 10/17/2014 3.6  3.5 - 5.1 mEq/L Final  . Chloride 10/17/2014 99  98 - 109 mEq/L Final  . CO2 10/17/2014 26  22 - 29 mEq/L Final  . Glucose 10/17/2014 330* 70 - 140 mg/dl Final  . BUN 10/17/2014 22.1  7.0 - 26.0 mg/dL Final  . Creatinine 10/17/2014 1.1  0.6 - 1.1 mg/dL Final  . Total Bilirubin 10/17/2014 0.47  0.20 - 1.20 mg/dL Final  . Alkaline Phosphatase 10/17/2014 103  40 - 150 U/L Final  . AST 10/17/2014 19  5 - 34 U/L Final  . ALT 10/17/2014 25  0 - 55 U/L Final  . Total Protein 10/17/2014 7.1  6.4 - 8.3 g/dL Final  . Albumin 10/17/2014 3.2* 3.5 - 5.0 g/dL Final  . Calcium 10/17/2014 10.0  8.4 - 10.4 mg/dL Final  . Anion Gap 10/17/2014 14* 3 - 11 mEq/L Final  .  EGFR 10/17/2014 48* >90 ml/min/1.73 m2 Final   eGFR is calculated using the CKD-EPI Creatinine Equation (2009)     RADIOGRAPHIC STUDIES: No results found.  ASSESSMENT/PLAN:    Type 2 diabetes mellitus with neurological manifestations, uncontrolled Patient has a history of known uncontrolled diabetes.  Blood sugar today with lab draw was 330.  Patient reports she has not been taking any of her previously prescribed diabetic medications.  Patient was advised to go home and take her diabetic medications as prescribed.  She should also check her blood sugar often.   Non-small cell carcinoma of lung, stage 4 Patient received cycle 2 of her carboplatin/Alimta chemotherapy regimen on 10/04/2014.  She is scheduled for cycle 3 of the same regimen on  10/25/2014.     Chemotherapy induced neutropenia Labs drawn today reveal a WBC of 0.8 and an ANC of 0.1.  Patient has been ordered Granix 300 g subcutaneously for a total of 3 days.  She will receive her last Granix injection tomorrow.  She was once again reminded of all neutropenic precautions.  Patient was wearing a mask while at the [facility] today.   Anorexia Patient complains of minimal appetite secondary to chronic esophagitis discomfort.  Patient was prescribed Carafate and Magic mouthwash with lidocaine.  She was also advised to increase her Prilosec to twice daily.  She was given Aquaphor for her lips and inside of her mouth as needed.  She was encouraged to eat multiple small meals and to push fluids as much as possible.   Dehydration Patient continues to complain of sore throat/esophagitis symptoms.  Patient has had little oral intake; feels dehydrated.  Patient will receive 1 L normal saline IV fluid rehydration today.  She was also encouraged to push fluids is much as possible.   Esophagitis Patient is complaining of continued, chronic sore throat pain.  She denies any oral lesions.  On exam-no specific oral lesions noted.  However, posterior oropharynx positive for erythema.  No exudate noted on exam.  Patient has a healing lesion to her lower lip as well.  Advised patient to increase her Prilosec to twice daily.  Also prescribed Carafate and Magic mouthwash with lidocaine for the patient.  Gave patient samples of Aquaphor to use to her lips and inside of her mouth on an as-needed basis.   Thrombocytopenia Chemotherapy-induced thrombocytopenia with a platelet count down to 50.  Patient denies any worsening issues with either easy bleeding or bruising.  Will continue to monitor closely.   Weakness Patient complaining of esophagitis discomfort; and subsequent poor oral intake/dehydration.  Patient does appear much weaker on exam today.  Patient was encouraged to push  fluids is much as possible; and will receive 1 L normal saline IV fluid rehydration while cancer Center today.   Patient stated understanding of all instructions; and was in agreement with this plan of care. The patient knows to call the clinic with any problems, questions or concerns.   Review/collaboration with Dr. Julien Nordmann regarding all aspects of patient's visit today.   Total time spent with patient was 40 minutes;  with greater than 75 percent of that time spent in face to face counseling regarding patient's symptoms,  and coordination of care and follow up.  Disclaimer: This note was dictated with voice recognition software. Similar sounding words can inadvertently be transcribed and may not be corrected upon review.   Drue Second, NP 10/18/2014

## 2014-10-18 NOTE — Assessment & Plan Note (Signed)
Chemotherapy-induced thrombocytopenia with a platelet count down to 50.  Patient denies any worsening issues with either easy bleeding or bruising.  Will continue to monitor closely.

## 2014-10-18 NOTE — Assessment & Plan Note (Signed)
Patient is complaining of continued, chronic sore throat pain.  She denies any oral lesions.  On exam-no specific oral lesions noted.  However, posterior oropharynx positive for erythema.  No exudate noted on exam.  Patient has a healing lesion to her lower lip as well.  Advised patient to increase her Prilosec to twice daily.  Also prescribed Carafate and Magic mouthwash with lidocaine for the patient.  Gave patient samples of Aquaphor to use to her lips and inside of her mouth on an as-needed basis.

## 2014-10-18 NOTE — Assessment & Plan Note (Signed)
Patient complaining of esophagitis discomfort; and subsequent poor oral intake/dehydration.  Patient does appear much weaker on exam today.  Patient was encouraged to push fluids is much as possible; and will receive 1 L normal saline IV fluid rehydration while cancer Center today.

## 2014-10-19 ENCOUNTER — Ambulatory Visit (HOSPITAL_BASED_OUTPATIENT_CLINIC_OR_DEPARTMENT_OTHER): Payer: Medicare Other

## 2014-10-19 ENCOUNTER — Telehealth: Payer: Self-pay | Admitting: Nurse Practitioner

## 2014-10-19 VITALS — BP 164/76 | HR 121 | Temp 98.2°F

## 2014-10-19 DIAGNOSIS — D701 Agranulocytosis secondary to cancer chemotherapy: Secondary | ICD-10-CM | POA: Diagnosis not present

## 2014-10-19 DIAGNOSIS — C3491 Malignant neoplasm of unspecified part of right bronchus or lung: Secondary | ICD-10-CM

## 2014-10-19 DIAGNOSIS — T451X5A Adverse effect of antineoplastic and immunosuppressive drugs, initial encounter: Principal | ICD-10-CM

## 2014-10-19 MED ORDER — TBO-FILGRASTIM 300 MCG/0.5ML ~~LOC~~ SOSY
300.0000 ug | PREFILLED_SYRINGE | Freq: Once | SUBCUTANEOUS | Status: AC
  Administered 2014-10-19: 300 ug via SUBCUTANEOUS
  Filled 2014-10-19: qty 0.5

## 2014-10-19 NOTE — Telephone Encounter (Signed)
Called pt to follow up from [facility]. Left message to call back

## 2014-10-24 ENCOUNTER — Other Ambulatory Visit: Payer: Medicare Other

## 2014-10-25 ENCOUNTER — Ambulatory Visit (HOSPITAL_BASED_OUTPATIENT_CLINIC_OR_DEPARTMENT_OTHER): Payer: Medicare Other | Admitting: Physician Assistant

## 2014-10-25 ENCOUNTER — Telehealth: Payer: Self-pay | Admitting: Physician Assistant

## 2014-10-25 ENCOUNTER — Ambulatory Visit (HOSPITAL_BASED_OUTPATIENT_CLINIC_OR_DEPARTMENT_OTHER): Payer: Medicare Other

## 2014-10-25 ENCOUNTER — Encounter: Payer: Self-pay | Admitting: Physician Assistant

## 2014-10-25 ENCOUNTER — Other Ambulatory Visit (HOSPITAL_BASED_OUTPATIENT_CLINIC_OR_DEPARTMENT_OTHER): Payer: Medicare Other

## 2014-10-25 VITALS — BP 166/77 | HR 101 | Resp 20

## 2014-10-25 VITALS — BP 173/82 | HR 108 | Temp 97.4°F | Resp 18 | Ht 59.0 in | Wt 116.3 lb

## 2014-10-25 DIAGNOSIS — C3491 Malignant neoplasm of unspecified part of right bronchus or lung: Secondary | ICD-10-CM

## 2014-10-25 DIAGNOSIS — Z5111 Encounter for antineoplastic chemotherapy: Secondary | ICD-10-CM | POA: Diagnosis not present

## 2014-10-25 DIAGNOSIS — N189 Chronic kidney disease, unspecified: Secondary | ICD-10-CM

## 2014-10-25 DIAGNOSIS — C7970 Secondary malignant neoplasm of unspecified adrenal gland: Secondary | ICD-10-CM | POA: Diagnosis not present

## 2014-10-25 DIAGNOSIS — E860 Dehydration: Secondary | ICD-10-CM

## 2014-10-25 DIAGNOSIS — R000 Tachycardia, unspecified: Secondary | ICD-10-CM | POA: Diagnosis not present

## 2014-10-25 DIAGNOSIS — C7951 Secondary malignant neoplasm of bone: Secondary | ICD-10-CM | POA: Diagnosis not present

## 2014-10-25 LAB — CBC WITH DIFFERENTIAL/PLATELET
BASO%: 1.1 % (ref 0.0–2.0)
BASOS ABS: 0.1 10*3/uL (ref 0.0–0.1)
EOS ABS: 0 10*3/uL (ref 0.0–0.5)
EOS%: 0.1 % (ref 0.0–7.0)
HCT: 25.8 % — ABNORMAL LOW (ref 34.8–46.6)
HGB: 8.7 g/dL — ABNORMAL LOW (ref 11.6–15.9)
LYMPH%: 10.8 % — AB (ref 14.0–49.7)
MCH: 26.3 pg (ref 25.1–34.0)
MCHC: 33.7 g/dL (ref 31.5–36.0)
MCV: 78.3 fL — ABNORMAL LOW (ref 79.5–101.0)
MONO#: 1.1 10*3/uL — ABNORMAL HIGH (ref 0.1–0.9)
MONO%: 8.2 % (ref 0.0–14.0)
NEUT#: 10.3 10*3/uL — ABNORMAL HIGH (ref 1.5–6.5)
NEUT%: 79.8 % — AB (ref 38.4–76.8)
PLATELETS: 495 10*3/uL — AB (ref 145–400)
RBC: 3.29 10*6/uL — AB (ref 3.70–5.45)
RDW: 13.8 % (ref 11.2–14.5)
WBC: 13 10*3/uL — AB (ref 3.9–10.3)
lymph#: 1.4 10*3/uL (ref 0.9–3.3)

## 2014-10-25 LAB — COMPREHENSIVE METABOLIC PANEL (CC13)
ALBUMIN: 2.7 g/dL — AB (ref 3.5–5.0)
ALK PHOS: 133 U/L (ref 40–150)
ALT: 18 U/L (ref 0–55)
AST: 17 U/L (ref 5–34)
Anion Gap: 17 mEq/L — ABNORMAL HIGH (ref 3–11)
BILIRUBIN TOTAL: 0.31 mg/dL (ref 0.20–1.20)
BUN: 14.8 mg/dL (ref 7.0–26.0)
CO2: 20 mEq/L — ABNORMAL LOW (ref 22–29)
CREATININE: 1.2 mg/dL — AB (ref 0.6–1.1)
Calcium: 8.1 mg/dL — ABNORMAL LOW (ref 8.4–10.4)
Chloride: 98 mEq/L (ref 98–109)
EGFR: 44 mL/min/{1.73_m2} — AB (ref >90)
GLUCOSE: 281 mg/dL — AB (ref 70–140)
Potassium: 3.1 mEq/L — ABNORMAL LOW (ref 3.5–5.1)
Sodium: 135 mEq/L — ABNORMAL LOW (ref 136–145)
Total Protein: 7.3 g/dL (ref 6.4–8.3)

## 2014-10-25 MED ORDER — SODIUM CHLORIDE 0.9 % IV SOLN
Freq: Once | INTRAVENOUS | Status: AC
  Administered 2014-10-25: 15:00:00 via INTRAVENOUS

## 2014-10-25 MED ORDER — SODIUM CHLORIDE 0.9 % IV SOLN
Freq: Once | INTRAVENOUS | Status: AC
  Administered 2014-10-25: 14:00:00 via INTRAVENOUS
  Filled 2014-10-25: qty 8

## 2014-10-25 MED ORDER — SODIUM CHLORIDE 0.9 % IV SOLN
317.0000 mg | Freq: Once | INTRAVENOUS | Status: AC
  Administered 2014-10-25: 320 mg via INTRAVENOUS
  Filled 2014-10-25: qty 32

## 2014-10-25 MED ORDER — PEMETREXED DISODIUM CHEMO INJECTION 500 MG
390.0000 mg/m2 | Freq: Once | INTRAVENOUS | Status: AC
  Administered 2014-10-25: 600 mg via INTRAVENOUS
  Filled 2014-10-25: qty 24

## 2014-10-25 MED ORDER — SODIUM CHLORIDE 0.9 % IV SOLN
Freq: Once | INTRAVENOUS | Status: AC
  Administered 2014-10-25: 13:00:00 via INTRAVENOUS

## 2014-10-25 MED ORDER — CYANOCOBALAMIN 1000 MCG/ML IJ SOLN
INTRAMUSCULAR | Status: AC
  Filled 2014-10-25: qty 1

## 2014-10-25 MED ORDER — CYANOCOBALAMIN 1000 MCG/ML IJ SOLN
1000.0000 ug | Freq: Once | INTRAMUSCULAR | Status: AC
  Administered 2014-10-25: 1000 ug via INTRAMUSCULAR

## 2014-10-25 NOTE — Patient Instructions (Signed)
[facility] Discharge Instructions for Patients Receiving Chemotherapy  Today you received the following chemotherapy agents: Alimta/Carboplatin  To help prevent nausea and vomiting after your treatment, we encourage you to take your nausea medication as directed.   If you develop nausea and vomiting that is not controlled by your nausea medication, call the clinic.   BELOW ARE SYMPTOMS THAT SHOULD BE REPORTED IMMEDIATELY:  *FEVER GREATER THAN 100.5 F  *CHILLS WITH OR WITHOUT FEVER  NAUSEA AND VOMITING THAT IS NOT CONTROLLED WITH YOUR NAUSEA MEDICATION  *UNUSUAL SHORTNESS OF BREATH  *UNUSUAL BRUISING OR BLEEDING  TENDERNESS IN MOUTH AND THROAT WITH OR WITHOUT PRESENCE OF ULCERS  *URINARY PROBLEMS  *BOWEL PROBLEMS  UNUSUAL RASH Items with * indicate a potential emergency and should be followed up as soon as possible.  Feel free to call the clinic you have any questions or concerns. The clinic phone number is [phone].  Please show the CHEMO ALERT CARD at check-in to the Emergency Department and triage nurse.   

## 2014-10-25 NOTE — Progress Notes (Addendum)
No images are attached to the encounter. No scans are attached to the encounter. No scans are attached to the encounter. [place] SHARED VISIT PROGRESS NOTE  [name], NP [address] [place] [address]  DIAGNOSIS: Non-small cell carcinoma of lung, stage 4   Staging form: Lung, AJCC 7th Edition     Clinical stage from 09/06/2014: Stage IV (T2a, N2, M1b) - Signed by Curt Bears, MD on 09/06/2014       Staging comments: Adenocarcinoma  Positive for BRAF  PRIOR THERAPY: none   CURRENT THERAPY: Systemic chemotherapy with carboplatin for an AUC of 5 and Alimta at 400 mg/m given every 3 weeks. Status post 2 cycles.  INTERVAL HISTORY: Michaela Shaw 74 y.o. female returns for a scheduled regular symptom management visit for followup of her recently diagnosed stage IV non-small cell lung cancer, adenocarcinoma. She is currently being treated with systemic chemotherapy in the form of carboplatin and Alimta. Her blood sugars have been a bit better since decreasing her dexamethasone to 4 mg by mouth the day before, of and after chemotherapy.She reports pain and aching form the [place]. She reports having no appetite. She did have some mouth sores but they resolved with magic mouthwash.  She denied fever, chills, nausea, vomiting, diarrhea or constipation. She's had no significant weight loss or night sweats. She presents to proceed with cycle #3.  MEDICAL HISTORY: Past Medical History  Diagnosis Date  . History of chicken pox   . Diabetes mellitus without complication   . Hyperlipidemia   . Hypertension   . 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 Seizures 2005  . Stroke 2011    "mild" per pt.   Marland Kitchen GERD (gastroesophageal reflux disease)   . Leg swelling     Left leg; cause is unknown; however pt stated it goes away at rest  . Impaired circulation of right leg     Takes Pletal  . CKD (chronic kidney  disease)     ALLERGIES:  is allergic to metformin and related.  MEDICATIONS:  Current Outpatient Prescriptions  Medication Sig Dispense Refill  . alendronate (FOSAMAX) 70 MG tablet Take 1 tablet (70 mg total) by mouth every 7 (seven) days. Take with a full glass of water on an empty stomach. 4 tablet 5  . Alum & Mag Hydroxide-Simeth (MAGIC MOUTHWASH W/LIDOCAINE) SOLN Take 5 mLs by mouth 4 (four) times daily as needed for mouth pain. 120 mL 1  . amLODipine (NORVASC) 10 MG tablet Take 10 mg by mouth daily.     Marland Kitchen aspirin 81 MG tablet Take 81 mg by mouth daily.    . Blood Glucose Monitoring Suppl (ONETOUCH VERIO) W/DEVICE KIT 1 each by Does not apply route daily. Please use to test blood sugar 4 times as instructed. Dx code: E11.40 1 kit 0  . Calcium Carbonate-Vitamin D (CALCIUM 600 + D PO) Take 1 tablet by mouth every morning.    . cilostazol (PLETAL) 100 MG tablet Take 1 tablet (100 mg total) by mouth 2 (two) times daily before a meal. 60 tablet 11  . cloNIDine (CATAPRES) 0.2 MG tablet TAKE ONE TABLET BY MOUTH TWICE DAILY 60 tablet 5  . co-enzyme Q-10 30 MG capsule Take 100 mg by mouth daily.    Marland Kitchen dexamethasone (DECADRON) 4 MG tablet 4 mg by mouth twice a day the day before, day of and day after the chemotherapy every 3 weeks 40 tablet  1  . fenofibrate (TRICOR) 145 MG tablet TAKE ONE TABLET BY MOUTH ONCE DAILY 30 tablet 1  . ferrous sulfate 325 (65 FE) MG tablet Take 325 mg by mouth 3 (three) times daily with meals.     . folic acid (FOLVITE) 1 MG tablet Take 1 tablet (1 mg total) by mouth daily. 30 tablet 3  . glucose blood (ONETOUCH VERIO) test strip Use to test blood sugar 4 times daily as instructed. Dx code: E11.40 200 each 5  . HYDROcodone-homatropine (HYCODAN) 5-1.5 MG/5ML syrup Take 5 mLs by mouth every 6 (six) hours as needed for cough. 240 mL 0  . insulin lispro (HUMALOG) 100 UNIT/ML injection To use in VGo 20 (up to 60 units daily) (Patient taking differently: Inject 10-12 units in  the morning, 10-12 units at lunch and 10-12 units at supper) 60 mL 3  . insulin NPH Human (HUMULIN N,NOVOLIN N) 100 UNIT/ML injection Inject 15 units into the skin before breakfast, mixed with the Regular insulin and 10 units into the skin before dinner, mixed with the Regular insulin. (Patient taking differently: Inject 20 units into the skin before breakfast,  10 units at supper) 10 mL 2  . Insulin Syringe-Needle U-100 (RELION INSULIN SYR 0.3CC/30G) 30G X 5/16" 0.3 ML MISC Inject 1 each into the skin 2 (two) times daily.    . Insulin Syringe-Needle U-100 31G X 5/16" 0.5 ML MISC Use to inject insulin 3 times daily. 100 each 11  . nystatin (MYCOSTATIN) 100000 UNIT/ML suspension     . omeprazole (PRILOSEC) 20 MG capsule TAKE ONE CAPSULE BY MOUTH IN THE MORNING 30 capsule 3  . ONETOUCH DELICA LANCETS 33A MISC Use to test blood sugar 4 times daily as instructed. 200 each 5  . potassium chloride SA (K-DUR,KLOR-CON) 20 MEQ tablet Take 1 tablet (20 mEq total) by mouth daily. 30 tablet 3  . prochlorperazine (COMPAZINE) 10 MG tablet Take 1 tablet (10 mg total) by mouth every 6 (six) hours as needed for nausea or vomiting. 60 tablet 0  . rosuvastatin (CRESTOR) 20 MG tablet Take 1 tablet (20 mg total) by mouth daily. 90 tablet 3  . sucralfate (CARAFATE) 1 GM/10ML suspension Take 10 mLs (1 g total) by mouth 4 (four) times daily -  with meals and at bedtime. 420 mL 1   No current facility-administered medications for this visit.    SURGICAL HISTORY:  Past Surgical History  Procedure Laterality Date  . Bladder suspension  1980  . Abdominal hysterectomy  1980    partial  . Cholecystectomy  2007  . Appendectomy  2007  . Spine surgery  2012    Has rods and pins  . Cataract extraction, bilateral    . Colonoscopy    . Upper gi endoscopy    . Video bronchoscopy with endobronchial ultrasound N/A 08/23/2014    Procedure: VIDEO BRONCHOSCOPY WITH ENDOBRONCHIAL ULTRASOUND with Biopsy;  Surgeon: Rigoberto Noel,  MD;  Location: [facility] OR;  Service: Thoracic;  Laterality: N/A;    REVIEW OF SYSTEMS:  ROS   PHYSICAL EXAMINATION: Physical Exam  Constitutional: She is oriented to person, place, and time and well-developed, well-nourished, and in no distress.  HENT:  Head: Normocephalic and atraumatic.  Mouth/Throat: Oropharynx is clear and moist.  Eyes: Pupils are equal, round, and reactive to light.  Neck: Normal range of motion. Neck supple. No JVD present. No tracheal deviation present. No thyromegaly present.  Cardiovascular: Normal rate, regular rhythm, normal heart sounds and intact distal pulses.  Exam reveals no gallop and no friction rub.   No murmur heard. Pulmonary/Chest: Effort normal and breath sounds normal. No respiratory distress. She has no wheezes. She has no rales.  Abdominal: Soft. Bowel sounds are normal. She exhibits no distension and no mass. There is no tenderness.  Musculoskeletal: Normal range of motion. She exhibits no edema or tenderness.  Lymphadenopathy:    She has no cervical adenopathy.  Neurological: She is alert and oriented to person, place, and time. She has normal reflexes. Gait normal.  Skin: Skin is warm and dry. No rash noted.    ECOG PERFORMANCE STATUS: 1 - Symptomatic but completely ambulatory  Blood pressure 173/82, pulse 108, temperature 97.4 F (36.3 C), temperature source Oral, resp. rate 18, height 4' 11"  (1.499 m), weight 116 lb 4.8 oz (52.753 kg), last menstrual period 07/06/1978, SpO2 98 %.  LABORATORY DATA: Lab Results  Component Value Date   WBC 13.0* 10/25/2014   HGB 8.7* 10/25/2014   HCT 25.8* 10/25/2014   MCV 78.3* 10/25/2014   PLT 495* 10/25/2014      Chemistry      Component Value Date/Time   NA 135* 10/25/2014 1016   NA 136 08/20/2014 0843   K 3.1* 10/25/2014 1016   K 4.1 08/20/2014 0843   CL 103 08/20/2014 0843   CO2 20* 10/25/2014 1016   CO2 26 08/20/2014 0843   BUN 14.8 10/25/2014 1016   BUN 17 08/20/2014 0843   CREATININE  1.2* 10/25/2014 1016   CREATININE 1.73* 08/20/2014 0843   CREATININE 1.61* 12/11/2013 1043      Component Value Date/Time   CALCIUM 8.1* 10/25/2014 1016   CALCIUM 9.4 08/20/2014 0843   ALKPHOS 133 10/25/2014 1016   ALKPHOS 54 05/04/2014 1033   AST 17 10/25/2014 1016   AST 21 05/04/2014 1033   ALT 18 10/25/2014 1016   ALT 12 05/04/2014 1033   BILITOT 0.31 10/25/2014 1016   BILITOT 0.6 05/04/2014 1033       RADIOGRAPHIC STUDIES:  No results found.   ASSESSMENT/PLAN:  No problem-specific assessment & plan notes found for this encounter.  patient is a very pleasant 74 year old Caucasian female diagnosed with stage IV disease T2a, N2, M1 B) non-small cell lung cancer, adenocarcinoma presenting with bilateral pulmonary nodules in addition to mediastinal lymphadenopathy and metastatic disease to the left renal gland and bones. This was diagnosed in February 2016. She's currently being treated with systemic chemotherapy in the form of carboplatin for an AUC of 5 and Alimta 400 milligrams per meter squared (due to her renal function) given every 3 weeks. Status post 2 cycles. Patient was discussed with and also seen by Dr. Julien Nordmann. She will continue to monitor her blood sugars closely. She will proceed with cycle #3 of her systemic chemotherapy today as scheduled and follow-up in 3 weeks prior to the start of cycle #4 with a restaging CT scan of her chest, abdomen and pelvis without contrast to re-evaluate her disease. She will continue with weekly labs in the interim.  Carlton Adam, PA-C 10/25/2014  All questions were answered. The patient knows to call the clinic with any problems, questions or concerns. We can certainly see the patient much sooner if necessary.   ADDENDUM: Hematology/Oncology Attending: I had a face to face encounter with the patient. I recommended her care plan. This is a very pleasant 74 years old white female with metastatic non-small cell lung cancer,  adenocarcinoma who is currently undergoing systemic chemotherapy with carboplatin and reduced  dose Alimta secondary to renal insufficiency. The patient tolerated the first 2 cycles of her treatment fairly well with no significant adverse effects except for mild fatigue and mouth soreness. I recommended for the patient to proceed with cycle #3 today as a scheduled. She would come back for follow-up visit in 3 weeks for evaluation after repeating CT scan of the chest, abdomen and pelvis without contrast. The patient was advised to call immediately if she has any concerning symptoms in the interval.  Disclaimer: This note was dictated with voice recognition software. Similar sounding words can inadvertently be transcribed and may be missed upon review. Eilleen Kempf., MD 10/29/2014

## 2014-10-25 NOTE — Telephone Encounter (Signed)
Gave avs & calendar for April/May.Patient already schedule. No pof

## 2014-10-25 NOTE — Progress Notes (Signed)
Per Adrena  PA, aware of abnormal labs and elevated HR. Adrena PA, ordered NS 500cc d/t pt's decreased intake.

## 2014-10-26 ENCOUNTER — Telehealth: Payer: Self-pay | Admitting: *Deleted

## 2014-10-26 MED ORDER — POTASSIUM CHLORIDE CRYS ER 20 MEQ PO TBCR: 20.0000 meq | EXTENDED_RELEASE_TABLET | Freq: Every day | ORAL | Status: AC

## 2014-10-26 NOTE — Telephone Encounter (Signed)
-----   Message from Curt Bears, MD sent at 10/26/2014 10:04 AM EDT ----- Call patient with the result and order K Dur 20 meq po qd X 7 days.

## 2014-10-26 NOTE — Telephone Encounter (Signed)
Rx sent to Pt pharmacy. Called pt lmovm with results and instructed pt to pick up Rx for K-Dur and begin today. Pt instructed to call office with any conerns

## 2014-10-26 NOTE — Progress Notes (Signed)
Quick Note:  Call patient with the result and order K Dur 20 meq po qd X 7 days ______ 

## 2014-10-27 NOTE — Patient Instructions (Signed)
Continue weekly labs Follow up in 3 weeks with a restaging CT scan of your chest, abdomen and pelvis to re-evaluate your disease

## 2014-11-01 ENCOUNTER — Telehealth: Payer: Self-pay | Admitting: Medical Oncology

## 2014-11-01 ENCOUNTER — Other Ambulatory Visit: Payer: Self-pay | Admitting: Medical Oncology

## 2014-11-01 ENCOUNTER — Other Ambulatory Visit (HOSPITAL_BASED_OUTPATIENT_CLINIC_OR_DEPARTMENT_OTHER): Payer: Medicare Other

## 2014-11-01 DIAGNOSIS — D6489 Other specified anemias: Secondary | ICD-10-CM

## 2014-11-01 DIAGNOSIS — C3491 Malignant neoplasm of unspecified part of right bronchus or lung: Secondary | ICD-10-CM

## 2014-11-01 LAB — CBC WITH DIFFERENTIAL/PLATELET
BASO%: 0 % (ref 0.0–2.0)
Basophils Absolute: 0 10*3/uL (ref 0.0–0.1)
EOS ABS: 0 10*3/uL (ref 0.0–0.5)
EOS%: 2 % (ref 0.0–7.0)
HCT: 21.1 % — ABNORMAL LOW (ref 34.8–46.6)
HGB: 7.1 g/dL — ABNORMAL LOW (ref 11.6–15.9)
LYMPH%: 26.1 % (ref 14.0–49.7)
MCH: 26.5 pg (ref 25.1–34.0)
MCHC: 33.6 g/dL (ref 31.5–36.0)
MCV: 78.7 fL — AB (ref 79.5–101.0)
MONO#: 0 10*3/uL — ABNORMAL LOW (ref 0.1–0.9)
MONO%: 0 % (ref 0.0–14.0)
NEUT#: 1.1 10*3/uL — ABNORMAL LOW (ref 1.5–6.5)
NEUT%: 71.9 % (ref 38.4–76.8)
PLATELETS: 501 10*3/uL — AB (ref 145–400)
RBC: 2.68 10*6/uL — AB (ref 3.70–5.45)
RDW: 13.1 % (ref 11.2–14.5)
WBC: 1.5 10*3/uL — ABNORMAL LOW (ref 3.9–10.3)
lymph#: 0.4 10*3/uL — ABNORMAL LOW (ref 0.9–3.3)
nRBC: 0 % (ref 0–0)

## 2014-11-01 LAB — COMPREHENSIVE METABOLIC PANEL (CC13)
ALT: 31 U/L (ref 0–55)
AST: 35 U/L — ABNORMAL HIGH (ref 5–34)
Albumin: 2.7 g/dL — ABNORMAL LOW (ref 3.5–5.0)
Alkaline Phosphatase: 105 U/L (ref 40–150)
Anion Gap: 18 mEq/L — ABNORMAL HIGH (ref 3–11)
BILIRUBIN TOTAL: 0.39 mg/dL (ref 0.20–1.20)
BUN: 24.8 mg/dL (ref 7.0–26.0)
CALCIUM: 8.8 mg/dL (ref 8.4–10.4)
CHLORIDE: 100 meq/L (ref 98–109)
CO2: 19 meq/L — AB (ref 22–29)
Creatinine: 1.9 mg/dL — ABNORMAL HIGH (ref 0.6–1.1)
EGFR: 26 mL/min/{1.73_m2} — ABNORMAL LOW (ref >90)
Glucose: 269 mg/dl — ABNORMAL HIGH (ref 70–140)
Potassium: 3.4 mEq/L — ABNORMAL LOW (ref 3.5–5.1)
Sodium: 136 mEq/L (ref 136–145)
TOTAL PROTEIN: 7.5 g/dL (ref 6.4–8.3)

## 2014-11-01 NOTE — Progress Notes (Signed)
Quick Note:  Call patient with the result and arrange for 2 units of PRBCs tomorrow ______

## 2014-11-01 NOTE — Progress Notes (Signed)
Har done 

## 2014-11-01 NOTE — Telephone Encounter (Signed)
HAR DONE. I left pt a message to call tomorrow morning for transfusion appt.

## 2014-11-02 ENCOUNTER — Ambulatory Visit (HOSPITAL_COMMUNITY)
Admission: RE | Admit: 2014-11-02 | Discharge: 2014-11-02 | Disposition: A | Discharge status: Home / Self Care | Payer: Medicare Other | Source: Ambulatory Visit | Attending: Internal Medicine | Admitting: Internal Medicine

## 2014-11-02 ENCOUNTER — Telehealth: Payer: Self-pay | Admitting: Internal Medicine

## 2014-11-02 ENCOUNTER — Other Ambulatory Visit: Payer: Medicare Other

## 2014-11-02 ENCOUNTER — Telehealth: Payer: Self-pay | Admitting: Medical Oncology

## 2014-11-02 ENCOUNTER — Other Ambulatory Visit: Payer: Self-pay | Admitting: Medical Oncology

## 2014-11-02 DIAGNOSIS — D6481 Anemia due to antineoplastic chemotherapy: Secondary | ICD-10-CM

## 2014-11-02 DIAGNOSIS — C3490 Malignant neoplasm of unspecified part of unspecified bronchus or lung: Secondary | ICD-10-CM | POA: Diagnosis not present

## 2014-11-02 DIAGNOSIS — D6489 Other specified anemias: Secondary | ICD-10-CM

## 2014-11-02 DIAGNOSIS — T451X5A Adverse effect of antineoplastic and immunosuppressive drugs, initial encounter: Principal | ICD-10-CM

## 2014-11-02 LAB — ABO/RH: ABO/RH(D): B NEG

## 2014-11-02 NOTE — Telephone Encounter (Signed)
lvm fo rpt regarding to labs.Marland KitchenMarland KitchenMarland Kitchen

## 2014-11-02 NOTE — Telephone Encounter (Signed)
Daughter confirmed appts.

## 2014-11-02 NOTE — Telephone Encounter (Signed)
Pt never called back about lab and transfusion. I left message for daughter to call regarding type and cross today and transfusion today or tomorrow.

## 2014-11-03 ENCOUNTER — Ambulatory Visit (HOSPITAL_BASED_OUTPATIENT_CLINIC_OR_DEPARTMENT_OTHER): Payer: Medicare Other

## 2014-11-03 VITALS — BP 183/70 | HR 75 | Temp 98.3°F | Resp 22

## 2014-11-03 DIAGNOSIS — D6489 Other specified anemias: Secondary | ICD-10-CM

## 2014-11-03 DIAGNOSIS — D6481 Anemia due to antineoplastic chemotherapy: Secondary | ICD-10-CM

## 2014-11-03 DIAGNOSIS — T451X5A Adverse effect of antineoplastic and immunosuppressive drugs, initial encounter: Secondary | ICD-10-CM

## 2014-11-03 DIAGNOSIS — C3490 Malignant neoplasm of unspecified part of unspecified bronchus or lung: Secondary | ICD-10-CM | POA: Diagnosis not present

## 2014-11-03 LAB — PREPARE RBC (CROSSMATCH)

## 2014-11-03 MED ORDER — DIPHENHYDRAMINE HCL 25 MG PO CAPS
ORAL_CAPSULE | ORAL | Status: AC
  Filled 2014-11-03: qty 1

## 2014-11-03 MED ORDER — SODIUM CHLORIDE 0.9 % IV SOLN: 250.0000 mL | Freq: Once | INTRAVENOUS | Status: DC

## 2014-11-03 MED ORDER — ACETAMINOPHEN 325 MG PO TABS
650.0000 mg | ORAL_TABLET | Freq: Once | ORAL | Status: AC
  Administered 2014-11-03: 650 mg via ORAL

## 2014-11-03 MED ORDER — DIPHENHYDRAMINE HCL 25 MG PO CAPS
25.0000 mg | ORAL_CAPSULE | Freq: Once | ORAL | Status: AC
  Administered 2014-11-03: 25 mg via ORAL

## 2014-11-03 MED ORDER — SODIUM CHLORIDE 0.9 % IV SOLN
250.0000 mL | Freq: Once | INTRAVENOUS | Status: AC
  Administered 2014-11-03: 250 mL via INTRAVENOUS

## 2014-11-03 MED ORDER — ACETAMINOPHEN 325 MG PO TABS
ORAL_TABLET | ORAL | Status: AC
  Filled 2014-11-03: qty 2

## 2014-11-03 NOTE — Patient Instructions (Signed)

## 2014-11-03 NOTE — Progress Notes (Signed)
Pt BP elevated 228'O systolic after blood transfusion.  Per pt daughter, pt takes BP medication but did not this morning.  Pt and daughter instructed to take BP medication when pt gets home due to increased fluids from transfusion.  Daughter verbalized understanding.

## 2014-11-05 ENCOUNTER — Encounter (HOSPITAL_COMMUNITY): Payer: Self-pay

## 2014-11-05 ENCOUNTER — Inpatient Hospital Stay (HOSPITAL_COMMUNITY)
Admission: EM | Admit: 2014-11-05 | Discharge: 2014-11-13 | DRG: 871 | Disposition: A | Discharge status: Hospice | Payer: Medicare Other | Attending: Internal Medicine | Admitting: Internal Medicine

## 2014-11-05 ENCOUNTER — Other Ambulatory Visit: Payer: Self-pay

## 2014-11-05 ENCOUNTER — Other Ambulatory Visit (HOSPITAL_COMMUNITY): Payer: Self-pay

## 2014-11-05 ENCOUNTER — Emergency Department (HOSPITAL_COMMUNITY): Payer: Medicare Other

## 2014-11-05 ENCOUNTER — Telehealth: Payer: Self-pay | Admitting: *Deleted

## 2014-11-05 ENCOUNTER — Inpatient Hospital Stay (HOSPITAL_COMMUNITY): Payer: Medicare Other

## 2014-11-05 DIAGNOSIS — I255 Ischemic cardiomyopathy: Secondary | ICD-10-CM | POA: Diagnosis present

## 2014-11-05 DIAGNOSIS — R509 Fever, unspecified: Secondary | ICD-10-CM

## 2014-11-05 DIAGNOSIS — E162 Hypoglycemia, unspecified: Secondary | ICD-10-CM | POA: Diagnosis not present

## 2014-11-05 DIAGNOSIS — R1310 Dysphagia, unspecified: Secondary | ICD-10-CM | POA: Diagnosis present

## 2014-11-05 DIAGNOSIS — A4151 Sepsis due to Escherichia coli [E. coli]: Principal | ICD-10-CM | POA: Diagnosis present

## 2014-11-05 DIAGNOSIS — K529 Noninfective gastroenteritis and colitis, unspecified: Secondary | ICD-10-CM | POA: Diagnosis present

## 2014-11-05 DIAGNOSIS — E1165 Type 2 diabetes mellitus with hyperglycemia: Secondary | ICD-10-CM | POA: Diagnosis present

## 2014-11-05 DIAGNOSIS — A4150 Gram-negative sepsis, unspecified: Secondary | ICD-10-CM | POA: Diagnosis not present

## 2014-11-05 DIAGNOSIS — Z800 Family history of malignant neoplasm of digestive organs: Secondary | ICD-10-CM | POA: Diagnosis not present

## 2014-11-05 DIAGNOSIS — I5021 Acute systolic (congestive) heart failure: Secondary | ICD-10-CM | POA: Diagnosis present

## 2014-11-05 DIAGNOSIS — A419 Sepsis, unspecified organism: Secondary | ICD-10-CM | POA: Diagnosis not present

## 2014-11-05 DIAGNOSIS — C7972 Secondary malignant neoplasm of left adrenal gland: Secondary | ICD-10-CM | POA: Diagnosis present

## 2014-11-05 DIAGNOSIS — E1149 Type 2 diabetes mellitus with other diabetic neurological complication: Secondary | ICD-10-CM | POA: Diagnosis present

## 2014-11-05 DIAGNOSIS — J189 Pneumonia, unspecified organism: Secondary | ICD-10-CM | POA: Diagnosis present

## 2014-11-05 DIAGNOSIS — Z90710 Acquired absence of both cervix and uterus: Secondary | ICD-10-CM | POA: Diagnosis not present

## 2014-11-05 DIAGNOSIS — D6959 Other secondary thrombocytopenia: Secondary | ICD-10-CM | POA: Diagnosis present

## 2014-11-05 DIAGNOSIS — R6520 Severe sepsis without septic shock: Secondary | ICD-10-CM | POA: Diagnosis present

## 2014-11-05 DIAGNOSIS — I429 Cardiomyopathy, unspecified: Secondary | ICD-10-CM

## 2014-11-05 DIAGNOSIS — R7881 Bacteremia: Secondary | ICD-10-CM | POA: Diagnosis not present

## 2014-11-05 DIAGNOSIS — C3490 Malignant neoplasm of unspecified part of unspecified bronchus or lung: Secondary | ICD-10-CM | POA: Diagnosis present

## 2014-11-05 DIAGNOSIS — D701 Agranulocytosis secondary to cancer chemotherapy: Secondary | ICD-10-CM | POA: Diagnosis not present

## 2014-11-05 DIAGNOSIS — E785 Hyperlipidemia, unspecified: Secondary | ICD-10-CM | POA: Diagnosis present

## 2014-11-05 DIAGNOSIS — I130 Hypertensive heart and chronic kidney disease with heart failure and stage 1 through stage 4 chronic kidney disease, or unspecified chronic kidney disease: Secondary | ICD-10-CM | POA: Diagnosis present

## 2014-11-05 DIAGNOSIS — N390 Urinary tract infection, site not specified: Secondary | ICD-10-CM | POA: Diagnosis present

## 2014-11-05 DIAGNOSIS — J90 Pleural effusion, not elsewhere classified: Secondary | ICD-10-CM | POA: Diagnosis present

## 2014-11-05 DIAGNOSIS — A414 Sepsis due to anaerobes: Secondary | ICD-10-CM | POA: Diagnosis present

## 2014-11-05 DIAGNOSIS — Z8673 Personal history of transient ischemic attack (TIA), and cerebral infarction without residual deficits: Secondary | ICD-10-CM | POA: Diagnosis not present

## 2014-11-05 DIAGNOSIS — R140 Abdominal distension (gaseous): Secondary | ICD-10-CM

## 2014-11-05 DIAGNOSIS — C7951 Secondary malignant neoplasm of bone: Secondary | ICD-10-CM | POA: Diagnosis present

## 2014-11-05 DIAGNOSIS — K210 Gastro-esophageal reflux disease with esophagitis: Secondary | ICD-10-CM | POA: Diagnosis present

## 2014-11-05 DIAGNOSIS — R630 Anorexia: Secondary | ICD-10-CM | POA: Diagnosis not present

## 2014-11-05 DIAGNOSIS — D509 Iron deficiency anemia, unspecified: Secondary | ICD-10-CM | POA: Diagnosis present

## 2014-11-05 DIAGNOSIS — IMO0001 Reserved for inherently not codable concepts without codable children: Secondary | ICD-10-CM | POA: Insufficient documentation

## 2014-11-05 DIAGNOSIS — D709 Neutropenia, unspecified: Secondary | ICD-10-CM | POA: Diagnosis not present

## 2014-11-05 DIAGNOSIS — D630 Anemia in neoplastic disease: Secondary | ICD-10-CM | POA: Diagnosis present

## 2014-11-05 DIAGNOSIS — Z9049 Acquired absence of other specified parts of digestive tract: Secondary | ICD-10-CM | POA: Diagnosis present

## 2014-11-05 DIAGNOSIS — N184 Chronic kidney disease, stage 4 (severe): Secondary | ICD-10-CM | POA: Diagnosis present

## 2014-11-05 DIAGNOSIS — K1230 Oral mucositis (ulcerative), unspecified: Secondary | ICD-10-CM | POA: Diagnosis present

## 2014-11-05 DIAGNOSIS — B370 Candidal stomatitis: Secondary | ICD-10-CM | POA: Diagnosis present

## 2014-11-05 DIAGNOSIS — Z515 Encounter for palliative care: Secondary | ICD-10-CM | POA: Diagnosis not present

## 2014-11-05 DIAGNOSIS — K209 Esophagitis, unspecified without bleeding: Secondary | ICD-10-CM

## 2014-11-05 DIAGNOSIS — E876 Hypokalemia: Secondary | ICD-10-CM | POA: Diagnosis present

## 2014-11-05 DIAGNOSIS — R000 Tachycardia, unspecified: Secondary | ICD-10-CM

## 2014-11-05 DIAGNOSIS — N170 Acute kidney failure with tubular necrosis: Secondary | ICD-10-CM | POA: Diagnosis present

## 2014-11-05 DIAGNOSIS — E440 Moderate protein-calorie malnutrition: Secondary | ICD-10-CM | POA: Diagnosis present

## 2014-11-05 DIAGNOSIS — Z6823 Body mass index (BMI) 23.0-23.9, adult: Secondary | ICD-10-CM | POA: Diagnosis not present

## 2014-11-05 DIAGNOSIS — E860 Dehydration: Secondary | ICD-10-CM | POA: Diagnosis present

## 2014-11-05 DIAGNOSIS — C3491 Malignant neoplasm of unspecified part of right bronchus or lung: Secondary | ICD-10-CM

## 2014-11-05 DIAGNOSIS — IMO0002 Reserved for concepts with insufficient information to code with codable children: Secondary | ICD-10-CM | POA: Diagnosis present

## 2014-11-05 DIAGNOSIS — I1310 Hypertensive heart and chronic kidney disease without heart failure, with stage 1 through stage 4 chronic kidney disease, or unspecified chronic kidney disease: Secondary | ICD-10-CM | POA: Diagnosis not present

## 2014-11-05 DIAGNOSIS — Z66 Do not resuscitate: Secondary | ICD-10-CM | POA: Diagnosis present

## 2014-11-05 DIAGNOSIS — B9620 Unspecified Escherichia coli [E. coli] as the cause of diseases classified elsewhere: Secondary | ICD-10-CM | POA: Diagnosis present

## 2014-11-05 DIAGNOSIS — Z794 Long term (current) use of insulin: Secondary | ICD-10-CM

## 2014-11-05 DIAGNOSIS — N179 Acute kidney failure, unspecified: Secondary | ICD-10-CM | POA: Diagnosis not present

## 2014-11-05 DIAGNOSIS — Z833 Family history of diabetes mellitus: Secondary | ICD-10-CM | POA: Diagnosis not present

## 2014-11-05 DIAGNOSIS — Z8249 Family history of ischemic heart disease and other diseases of the circulatory system: Secondary | ICD-10-CM

## 2014-11-05 DIAGNOSIS — R112 Nausea with vomiting, unspecified: Secondary | ICD-10-CM | POA: Diagnosis present

## 2014-11-05 DIAGNOSIS — E870 Hyperosmolality and hypernatremia: Secondary | ICD-10-CM | POA: Diagnosis present

## 2014-11-05 DIAGNOSIS — D61810 Antineoplastic chemotherapy induced pancytopenia: Secondary | ICD-10-CM | POA: Diagnosis present

## 2014-11-05 DIAGNOSIS — I739 Peripheral vascular disease, unspecified: Secondary | ICD-10-CM | POA: Diagnosis present

## 2014-11-05 DIAGNOSIS — B961 Klebsiella pneumoniae [K. pneumoniae] as the cause of diseases classified elsewhere: Secondary | ICD-10-CM | POA: Diagnosis present

## 2014-11-05 DIAGNOSIS — R5081 Fever presenting with conditions classified elsewhere: Secondary | ICD-10-CM | POA: Diagnosis present

## 2014-11-05 DIAGNOSIS — Z820 Family history of epilepsy and other diseases of the nervous system: Secondary | ICD-10-CM | POA: Diagnosis not present

## 2014-11-05 DIAGNOSIS — I10 Essential (primary) hypertension: Secondary | ICD-10-CM | POA: Diagnosis present

## 2014-11-05 DIAGNOSIS — R0600 Dyspnea, unspecified: Secondary | ICD-10-CM

## 2014-11-05 DIAGNOSIS — E11649 Type 2 diabetes mellitus with hypoglycemia without coma: Secondary | ICD-10-CM | POA: Diagnosis present

## 2014-11-05 DIAGNOSIS — N183 Chronic kidney disease, stage 3 (moderate): Secondary | ICD-10-CM | POA: Diagnosis not present

## 2014-11-05 DIAGNOSIS — D696 Thrombocytopenia, unspecified: Secondary | ICD-10-CM

## 2014-11-05 DIAGNOSIS — E8770 Fluid overload, unspecified: Secondary | ICD-10-CM | POA: Diagnosis present

## 2014-11-05 DIAGNOSIS — R7989 Other specified abnormal findings of blood chemistry: Secondary | ICD-10-CM

## 2014-11-05 DIAGNOSIS — T451X5A Adverse effect of antineoplastic and immunosuppressive drugs, initial encounter: Secondary | ICD-10-CM | POA: Diagnosis present

## 2014-11-05 DIAGNOSIS — A4159 Other Gram-negative sepsis: Secondary | ICD-10-CM | POA: Diagnosis present

## 2014-11-05 DIAGNOSIS — R778 Other specified abnormalities of plasma proteins: Secondary | ICD-10-CM

## 2014-11-05 DIAGNOSIS — E11319 Type 2 diabetes mellitus with unspecified diabetic retinopathy without macular edema: Secondary | ICD-10-CM | POA: Diagnosis present

## 2014-11-05 DIAGNOSIS — R531 Weakness: Secondary | ICD-10-CM

## 2014-11-05 DIAGNOSIS — I339 Acute and subacute endocarditis, unspecified: Secondary | ICD-10-CM | POA: Diagnosis not present

## 2014-11-05 DIAGNOSIS — J9600 Acute respiratory failure, unspecified whether with hypoxia or hypercapnia: Secondary | ICD-10-CM

## 2014-11-05 HISTORY — DX: Malignant neoplasm of unspecified part of unspecified bronchus or lung: C34.90

## 2014-11-05 LAB — CBC WITH DIFFERENTIAL/PLATELET
BASOS ABS: 0 10*3/uL (ref 0.0–0.1)
BASOS PCT: 0 % (ref 0–1)
EOS ABS: 0 10*3/uL (ref 0.0–0.7)
Eosinophils Relative: 0 % (ref 0–5)
HEMATOCRIT: 34.6 % — AB (ref 36.0–46.0)
Hemoglobin: 12 g/dL (ref 12.0–15.0)
LYMPHS ABS: 0.2 10*3/uL — AB (ref 0.7–4.0)
Lymphocytes Relative: 94 % — ABNORMAL HIGH (ref 12–46)
MCH: 28.8 pg (ref 26.0–34.0)
MCHC: 34.7 g/dL (ref 30.0–36.0)
MCV: 83.2 fL (ref 78.0–100.0)
MONOS PCT: 6 % (ref 3–12)
Monocytes Absolute: 0 10*3/uL — ABNORMAL LOW (ref 0.1–1.0)
NEUTROS ABS: 0 10*3/uL — AB (ref 1.7–7.7)
Neutrophils Relative %: 0 % — ABNORMAL LOW (ref 43–77)
PLATELETS: 29 10*3/uL — AB (ref 150–400)
RBC: 4.16 MIL/uL (ref 3.87–5.11)
RDW: 14 % (ref 11.5–15.5)
WBC: 0.2 10*3/uL — CL (ref 4.0–10.5)

## 2014-11-05 LAB — TYPE AND SCREEN
ABO/RH(D): B NEG
ANTIBODY SCREEN: NEGATIVE
UNIT DIVISION: 0
Unit division: 0

## 2014-11-05 LAB — URINALYSIS, ROUTINE W REFLEX MICROSCOPIC
Bilirubin Urine: NEGATIVE
Glucose, UA: >1000 mg/dL — AB
Ketones, ur: NEGATIVE mg/dL
Leukocytes, UA: NEGATIVE
Nitrite: NEGATIVE
Protein, ur: 30 mg/dL — AB
Specific Gravity, Urine: 1.017 (ref 1.005–1.030)
UROBILINOGEN UA: 0.2 mg/dL (ref 0.0–1.0)
pH: 5 (ref 5.0–8.0)

## 2014-11-05 LAB — COMPREHENSIVE METABOLIC PANEL
ALK PHOS: 116 U/L (ref 38–126)
ALT: 26 U/L (ref 14–54)
AST: 26 U/L (ref 15–41)
Albumin: 3 g/dL — ABNORMAL LOW (ref 3.5–5.0)
Anion gap: 16 — ABNORMAL HIGH (ref 5–15)
BILIRUBIN TOTAL: 1.1 mg/dL (ref 0.3–1.2)
BUN: 47 mg/dL — ABNORMAL HIGH (ref 6–20)
CALCIUM: 8.7 mg/dL — AB (ref 8.9–10.3)
CO2: 20 mmol/L — ABNORMAL LOW (ref 22–32)
Chloride: 99 mmol/L — ABNORMAL LOW (ref 101–111)
Creatinine, Ser: 2.27 mg/dL — ABNORMAL HIGH (ref 0.44–1.00)
GFR calc non Af Amer: 20 mL/min — ABNORMAL LOW (ref >60)
GFR, EST AFRICAN AMERICAN: 23 mL/min — AB (ref >60)
Glucose, Bld: 555 mg/dL (ref 70–99)
POTASSIUM: 3.4 mmol/L — AB (ref 3.5–5.1)
Sodium: 135 mmol/L (ref 135–145)
TOTAL PROTEIN: 7.6 g/dL (ref 6.5–8.1)

## 2014-11-05 LAB — URINE MICROSCOPIC-ADD ON

## 2014-11-05 LAB — LIPASE, BLOOD: Lipase: 26 U/L (ref 22–51)

## 2014-11-05 LAB — I-STAT CG4 LACTIC ACID, ED: LACTIC ACID, VENOUS: 2.77 mmol/L — AB (ref 0.5–2.0)

## 2014-11-05 MED ORDER — ONDANSETRON HCL 4 MG/2ML IJ SOLN
4.0000 mg | Freq: Once | INTRAMUSCULAR | Status: DC
  Filled 2014-11-05: qty 2

## 2014-11-05 MED ORDER — INSULIN GLARGINE 100 UNIT/ML ~~LOC~~ SOLN
18.0000 [IU] | Freq: Every day | SUBCUTANEOUS | Status: DC
  Administered 2014-11-05 – 2014-11-06 (×2): 18 [IU] via SUBCUTANEOUS
  Filled 2014-11-05 (×3): qty 0.18

## 2014-11-05 MED ORDER — IOHEXOL 300 MG/ML  SOLN: 50.0000 mL | Freq: Once | INTRAMUSCULAR | Status: AC | PRN

## 2014-11-05 MED ORDER — PIPERACILLIN-TAZOBACTAM IN DEX 2-0.25 GM/50ML IV SOLN
2.2500 g | Freq: Three times a day (TID) | INTRAVENOUS | Status: DC
  Administered 2014-11-05: 2.25 g via INTRAVENOUS
  Filled 2014-11-05 (×2): qty 50

## 2014-11-05 MED ORDER — VANCOMYCIN HCL IN DEXTROSE 1-5 GM/200ML-% IV SOLN: 1000.0000 mg | Freq: Once | INTRAVENOUS | Status: DC

## 2014-11-05 MED ORDER — SODIUM CHLORIDE 0.9 % IV BOLUS (SEPSIS)
500.0000 mL | INTRAVENOUS | Status: AC
  Administered 2014-11-05: 500 mL via INTRAVENOUS

## 2014-11-05 MED ORDER — SODIUM CHLORIDE 0.9 % IV BOLUS (SEPSIS)
1000.0000 mL | Freq: Once | INTRAVENOUS | Status: AC
  Administered 2014-11-05: 1000 mL via INTRAVENOUS

## 2014-11-05 MED ORDER — PIPERACILLIN-TAZOBACTAM 3.375 G IVPB 30 MIN: 3.3750 g | Freq: Once | INTRAVENOUS | Status: DC

## 2014-11-05 MED ORDER — SODIUM CHLORIDE 0.9 % IV SOLN
INTRAVENOUS | Status: DC
  Administered 2014-11-05 – 2014-11-06 (×2): via INTRAVENOUS

## 2014-11-05 MED ORDER — ONDANSETRON HCL 4 MG/2ML IJ SOLN: 4.0000 mg | Freq: Three times a day (TID) | INTRAMUSCULAR | Status: DC | PRN

## 2014-11-05 MED ORDER — MORPHINE SULFATE 4 MG/ML IJ SOLN
4.0000 mg | Freq: Once | INTRAMUSCULAR | Status: AC
  Administered 2014-11-05: 4 mg via INTRAVENOUS
  Filled 2014-11-05: qty 1

## 2014-11-05 MED ORDER — HYDROMORPHONE HCL 1 MG/ML IJ SOLN: 0.5000 mg | INTRAMUSCULAR | Status: DC | PRN

## 2014-11-05 MED ORDER — VANCOMYCIN HCL IN DEXTROSE 750-5 MG/150ML-% IV SOLN
750.0000 mg | INTRAVENOUS | Status: DC
  Administered 2014-11-05 – 2014-11-06 (×2): 750 mg via INTRAVENOUS
  Filled 2014-11-05 (×4): qty 150

## 2014-11-05 NOTE — Progress Notes (Signed)
ANTIBIOTIC CONSULT NOTE - INITIAL  Pharmacy Consult for vanc/zosyn Indication: sepsis  Allergies  Allergen Reactions  . Metformin And Related Other (See Comments)    Kidney function    Patient Measurements: Height: '4\' 11"'$  (149.9 cm) Weight: 112 lb (50.803 kg) IBW/kg (Calculated) : 43.2   Vital Signs: Temp: 100.2 F (37.9 C) (05/02 2122) Temp Source: Rectal (05/02 2122) BP: 149/90 mmHg (05/02 2107) Pulse Rate: 117 (05/02 2107) Intake/Output from previous day:   Intake/Output from this shift:    Labs:  Recent Labs  11/05/14 2010  WBC 0.2*  HGB 12.0  PLT 29*  CREATININE 2.27*   Estimated Creatinine Clearance: 14.8 mL/min (by C-G formula based on Cr of 2.27). No results for input(s): VANCOTROUGH, VANCOPEAK, VANCORANDOM, GENTTROUGH, GENTPEAK, GENTRANDOM, TOBRATROUGH, TOBRAPEAK, TOBRARND, AMIKACINPEAK, AMIKACINTROU, AMIKACIN in the last 72 hours.   Microbiology: No results found for this or any previous visit (from the past 720 hour(s)).  Medical History: Past Medical History  Diagnosis Date  . History of chicken pox   . Diabetes mellitus without complication   . Hyperlipidemia   . Hypertension   . 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 Seizures 2005  . Stroke 2011    "mild" per pt.   Marland Kitchen GERD (gastroesophageal reflux disease)   . Leg swelling     Left leg; cause is unknown; however pt stated it goes away at rest  . Impaired circulation of right leg     Takes Pletal  . CKD (chronic kidney disease)   . Lung cancer     Assessment: 74 y.o. female with a hx of IDDM, HTN, CVA, Lung CA (Dx March 3rd with first Chemo was 09/13/14 and last chemo on 10/25/14) presents to the Emergency Department complaining of gradual, persistent, progressively worsening myalgias, fatigue, decreased onset after the first round of chemotherapy.  Pharmacy consulted to dose vancomycin and zosyn for sepsis.  Goal of Therapy:  Vancomycin  trough level 15-20 mcg/ml Antibiotics per renal function  Plan:   Zosyn 2.'25mg'$  IV q8h  Vancomycin '750mg'$  IV q48h  Follow renal function, cultures, clinical course  Vanc trough at steady state  Dolly Rias RPh 11/05/2014, 9:44 PM Pager [phone]

## 2014-11-05 NOTE — ED Provider Notes (Signed)
CSN: [id number]     Arrival date & time 11/05/14  1914 History   First MD Initiated Contact with Patient 11/05/14 2020     Chief Complaint  Patient presents with  . Emesis     (Consider location/radiation/quality/duration/timing/severity/associated sxs/prior Treatment) The history is provided by the patient, a relative and medical records. No language interpreter was used.     Michaela Shaw is a 73 y.o. female  with a hx of IDDM, HTN, CVA, Lung CA (Dx March 3rd with first Chemo was 09/13/14 and last chemo on 10/25/14) presents to the Emergency Department complaining of gradual, persistent, progressively worsening myalgias, fatigue, decreased  onset after the first round of chemotherapy.  Pt reports nausea and vomiting began Saturday night (after the blood transfusion).  Pt reports no treatments for nausea and vomiting.  Pt reports emesis is NBNB.   Pt received fluids and neupogen were given 2 weeks ago and 2 units of blood was given 3 days ago.  Patient family reports that her energy has been declining and they're concerned about her.  She reports associated sore throat and oral lesions persistent since her onset of chemotherapy but worsened in the last several days with her repetitive vomiting.  She denies measured fever at home, chills, headache, neck pain or chest pain, syncope, dysuria, hematuria. Patient does endorse associated generalized abdominal pain described as cramping, generalized weakness.  Patient also endorses shortness of breath and reports that this has been present since before her lung cancer diagnosis.  PCP - [place]   Past Medical History  Diagnosis Date  . History of chicken pox   . Diabetes mellitus without complication   . Hyperlipidemia   . Hypertension   . 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 Seizures 2005  . Stroke 2011    "mild" per pt.   Marland Kitchen GERD (gastroesophageal reflux disease)   .  Leg swelling     Left leg; cause is unknown; however pt stated it goes away at rest  . Impaired circulation of right leg     Takes Pletal  . CKD (chronic kidney disease)   . Lung cancer    Past Surgical History  Procedure Laterality Date  . Bladder suspension  1980  . Abdominal hysterectomy  1980    partial  . Cholecystectomy  2007  . Appendectomy  2007  . Spine surgery  2012    Has rods and pins  . Cataract extraction, bilateral    . Colonoscopy    . Upper gi endoscopy    . Video bronchoscopy with endobronchial ultrasound N/A 08/23/2014    Procedure: VIDEO BRONCHOSCOPY WITH ENDOBRONCHIAL ULTRASOUND with Biopsy;  Surgeon: Rigoberto Noel, MD;  Location: [facility] OR;  Service: Thoracic;  Laterality: N/A;   Family History  Problem Relation Age of Onset  . Heart disease Mother   . Diabetes Mother   . Alzheimer's disease Father   . Cancer Sister     colon  . Diabetes Sister   . Diabetes Daughter   . Diabetes Sister    History  Substance Use Topics  . Smoking status: Never Smoker   . Smokeless tobacco: Never Used  . Alcohol Use: No   OB History    No data available     Review of Systems  Constitutional: Negative for fever, diaphoresis, appetite change, fatigue and unexpected weight change.  HENT: Positive for sore throat. Negative for mouth sores.   Eyes: Negative  for visual disturbance.  Respiratory: Positive for shortness of breath. Negative for cough, chest tightness and wheezing.   Cardiovascular: Negative for chest pain.  Gastrointestinal: Positive for nausea, vomiting and abdominal pain. Negative for diarrhea and constipation.  Endocrine: Negative for polydipsia, polyphagia and polyuria.  Genitourinary: Negative for dysuria, urgency, frequency and hematuria.  Musculoskeletal: Negative for back pain and neck stiffness.  Skin: Negative for rash.  Allergic/Immunologic: Negative for immunocompromised state.  Neurological: Negative for syncope, light-headedness and  headaches.  Hematological: Does not bruise/bleed easily.  Psychiatric/Behavioral: Negative for sleep disturbance. The patient is not nervous/anxious.       Allergies  Metformin and related  Home Medications   Prior to Admission medications   Medication Sig Start Date End Date Taking? Authorizing Provider  alendronate (FOSAMAX) 70 MG tablet Take 1 tablet (70 mg total) by mouth every 7 (seven) days. Take with a full glass of water on an empty stomach. 07/13/14  Yes Debbrah Alar, NP  Alum & Mag Hydroxide-Simeth (MAGIC MOUTHWASH W/LIDOCAINE) SOLN Take 5 mLs by mouth 4 (four) times daily as needed for mouth pain. 10/18/14  Yes Susanne Borders, NP  amLODipine (NORVASC) 10 MG tablet Take 10 mg by mouth daily.  07/02/14  Yes Historical Provider, MD  aspirin 81 MG tablet Take 81 mg by mouth daily.   Yes Historical Provider, MD  Blood Glucose Monitoring Suppl (ONETOUCH VERIO) W/DEVICE KIT 1 each by Does not apply route daily. Please use to test blood sugar 4 times as instructed. Dx code: E11.40 04/26/14  Yes Philemon Kingdom, MD  Calcium Carbonate-Vitamin D (CALCIUM 600 + D PO) Take 1 tablet by mouth every morning.   Yes Historical Provider, MD  cilostazol (PLETAL) 100 MG tablet Take 1 tablet (100 mg total) by mouth 2 (two) times daily before a meal. 02/21/14  Yes Serafina Mitchell, MD  cloNIDine (CATAPRES) 0.2 MG tablet TAKE ONE TABLET BY MOUTH TWICE DAILY 07/02/14  Yes Debbrah Alar, NP  co-enzyme Q-10 30 MG capsule Take 100 mg by mouth daily.   Yes Historical Provider, MD  dexamethasone (DECADRON) 4 MG tablet 4 mg by mouth twice a day the day before, day of and day after the chemotherapy every 3 weeks Patient taking differently: 4 mg by mouth twice a day the day before, day of and day after the chemotherapy every 3 weeks only if needed for nausea, due to raising blood sugar level 09/08/14  Yes Curt Bears, MD  fenofibrate (TRICOR) 145 MG tablet TAKE ONE TABLET BY MOUTH ONCE DAILY 08/01/14   Yes Debbrah Alar, NP  ferrous sulfate 325 (65 FE) MG tablet Take 325 mg by mouth 3 (three) times daily with meals.    Yes Historical Provider, MD  folic acid (FOLVITE) 1 MG tablet Take 1 tablet (1 mg total) by mouth daily. 09/08/14  Yes Curt Bears, MD  glucose blood (ONETOUCH VERIO) test strip Use to test blood sugar 4 times daily as instructed. Dx code: E11.40 04/26/14  Yes Philemon Kingdom, MD  HYDROcodone-homatropine [facility]) 5-1.5 MG/5ML syrup Take 5 mLs by mouth every 6 (six) hours as needed for cough. 10/10/14  Yes Adrena E Johnson, PA-C  insulin lispro (HUMALOG) 100 UNIT/ML injection To use in VGo 20 (up to 60 units daily) Patient taking differently: Inject 10-12 units in the morning, 10-12 units at lunch and 10-12 units at supper 04/26/14  Yes Philemon Kingdom, MD  insulin NPH Human (HUMULIN N,NOVOLIN N) 100 UNIT/ML injection Inject 15 units into the skin before  breakfast, mixed with the Regular insulin and 10 units into the skin before dinner, mixed with the Regular insulin. Patient taking differently: Inject 20 units into the skin before breakfast,  10 units at supper 07/19/14  Yes Philemon Kingdom, MD  Insulin Syringe-Needle U-100 (RELION INSULIN SYR 0.3CC/30G) 30G X 5/16" 0.3 ML MISC Inject 1 each into the skin 2 (two) times daily.   Yes Historical Provider, MD  Insulin Syringe-Needle U-100 31G X 5/16" 0.5 ML MISC Use to inject insulin 3 times daily. 07/19/14  Yes Philemon Kingdom, MD  omeprazole (PRILOSEC) 20 MG capsule TAKE ONE CAPSULE BY MOUTH IN THE MORNING 06/06/14  Yes Debbrah Alar, NP  ONETOUCH DELICA LANCETS 43X MISC Use to test blood sugar 4 times daily as instructed. 04/26/14  Yes Philemon Kingdom, MD  potassium chloride SA (K-DUR,KLOR-CON) 20 MEQ tablet Take 1 tablet (20 mEq total) by mouth daily. 10/26/14  Yes Curt Bears, MD  prochlorperazine (COMPAZINE) 10 MG tablet Take 1 tablet (10 mg total) by mouth every 6 (six) hours as needed for nausea or vomiting. 09/08/14   Yes Curt Bears, MD  rosuvastatin (CRESTOR) 20 MG tablet Take 1 tablet (20 mg total) by mouth daily.   Yes Debbrah Alar, NP  sucralfate (CARAFATE) 1 GM/10ML suspension Take 10 mLs (1 g total) by mouth 4 (four) times daily -  with meals and at bedtime. 10/18/14  Yes Susanne Borders, NP  nystatin (MYCOSTATIN) 100000 UNIT/ML suspension Use as directed 5 mLs in the mouth or throat.  10/18/14   Historical Provider, MD   BP 152/94 mmHg  Pulse 125  Temp(Src) 100.2 F (37.9 C) (Rectal)  Resp 28  Ht 4' 11"  (1.499 m)  Wt 112 lb (50.803 kg)  BMI 22.61 kg/m2  SpO2 94%  LMP 07/06/1978 Physical Exam  Constitutional: She appears well-developed and well-nourished. No distress.  Awake, alert, nontoxic appearance  HENT:  Head: Normocephalic and atraumatic.  Mouth/Throat: Oropharynx is clear and moist. No oropharyngeal exudate.  Eyes: Conjunctivae are normal. No scleral icterus.  Neck: Normal range of motion. Neck supple.  Cardiovascular: Regular rhythm, normal heart sounds and intact distal pulses.  Tachycardia present.   Pulses:      Radial pulses are 2+ on the right side, and 2+ on the left side.  Capillary refill less than 3 seconds  Pulmonary/Chest: Effort normal and breath sounds normal. No respiratory distress. She has no wheezes.  Equal chest expansion  Abdominal: Soft. Bowel sounds are normal. She exhibits no distension and no mass. There is generalized tenderness. There is no rebound, no guarding and no CVA tenderness.  Generalized abdominal tenderness without guarding, rebound or CVA tenderness  Musculoskeletal: Normal range of motion. She exhibits no edema.  Neurological: She is alert.  Speech is clear and goal oriented Moves extremities without ataxia  Skin: Skin is warm and dry. She is not diaphoretic.  Psychiatric: She has a normal mood and affect.  Nursing note and vitals reviewed.   ED Course  Procedures (including critical care time) Labs Review Labs Reviewed  CBC  WITH DIFFERENTIAL/PLATELET - Abnormal; Notable for the following:    WBC 0.2 (*)    HCT 34.6 (*)    Platelets 29 (*)    Neutrophils Relative % 0 (*)    Lymphocytes Relative 94 (*)    Neutro Abs 0.0 (*)    Lymphs Abs 0.2 (*)    Monocytes Absolute 0.0 (*)    All other components within normal limits  COMPREHENSIVE METABOLIC PANEL -  Abnormal; Notable for the following:    Potassium 3.4 (*)    Chloride 99 (*)    CO2 20 (*)    Glucose, Bld 555 (*)    BUN 47 (*)    Creatinine, Ser 2.27 (*)    Calcium 8.7 (*)    Albumin 3.0 (*)    GFR calc non Af Amer 20 (*)    GFR calc Af Amer 23 (*)    Anion gap 16 (*)    All other components within normal limits  URINALYSIS, ROUTINE W REFLEX MICROSCOPIC - Abnormal; Notable for the following:    APPearance CLOUDY (*)    Glucose, UA >1000 (*)    Hgb urine dipstick SMALL (*)    Protein, ur 30 (*)    All other components within normal limits  URINE MICROSCOPIC-ADD ON - Abnormal; Notable for the following:    Bacteria, UA MANY (*)    All other components within normal limits  I-STAT CG4 LACTIC ACID, ED - Abnormal; Notable for the following:    Lactic Acid, Venous 2.77 (*)    All other components within normal limits  CULTURE, BLOOD (ROUTINE X 2)  CULTURE, BLOOD (ROUTINE X 2)  URINE CULTURE  LIPASE, BLOOD    Imaging Review Dg Chest Port 1 View  11/05/2014   CLINICAL DATA:  Fever and vomiting 48 hr  EXAM: PORTABLE CHEST - 1 VIEW  COMPARISON:  PET-CT scan 08/06/2014, radiograph 08/23/2014.  FINDINGS: Normal cardiac silhouette. There is a moderate right pleural effusions increased in volume compared to prior. This effusion slightly partially loculated. There is central venous congestion. A perihilar opacity similar to comparison PET-CT scan.  IMPRESSION: Increasing right pleural effusion. Right perihilar opacity similar to prior PET-CT   Electronically Signed   By: Suzy Bouchard M.D.   On: 11/05/2014 22:12     EKG Interpretation None       CRITICAL CARE Performed by: Abigail Butts Total critical care time: 16mn Critical care time was exclusive of separately billable procedures and treating other patients. Critical care was necessary to treat or prevent imminent or life-threatening deterioration. Critical care was time spent personally by me on the following activities: development of treatment plan with patient and/or surrogate as well as nursing, discussions with consultants, evaluation of patient's response to treatment, examination of patient, obtaining history from patient or surrogate, ordering and performing treatments and interventions, ordering and review of laboratory studies, ordering and review of radiographic studies, pulse oximetry and re-evaluation of patient's condition.   MDM   Final diagnoses:  Dehydration  Esophagitis  Thrombocytopenia  Neutropenia  Sepsis, due to unspecified organism  Fever, unspecified fever cause  Tachycardia  Weakness  Chemotherapy induced neutropenia  Non-small cell carcinoma of lung, stage 4, right   Michaela Decamppresents with nausea, vomiting and diarrhea for the last 3 days.  Patient is febrile and tachycardic with an elevated lactic acid; she meets the Sirs/sepsis criteria. Patient with severe neutropenia 0.2. She is hyperglycemic with an elevated anion gap of 16.  Elevated BUN/creatinine creatinine above baseline with evidence of organ dysfunction indicating severe sepsis.    Patient given vancomycin and Zosyn, 30 mL/kg bolus and supportive therapy. She is alert and able to answer questions. Abdomen is tender but soft and without guarding or rigidity.  Urinalysis without evidence of urinary tract infection. Chest x-ray with right pleural effusion unchanged from previous studies.  Patient will need admission.  BP 152/94 mmHg  Pulse 125  Temp(Src) 100.2 F (  37.9 C) (Rectal)  Resp 28  Ht 4' 11"  (1.499 m)  Wt 112 lb (50.803 kg)  BMI 22.61 kg/m2  SpO2 94%  LMP  07/06/1978  11:03 PM Discussed with Dr. Blaine Hamper who will admit to stepdown.    The patient was discussed with and seen by Dr. Venora Maples who agrees with the treatment plan.   Abigail Butts, PA-C 11/05/14 [place], PA-C 11/05/14 [place], MD 11/06/14 [phone]

## 2014-11-05 NOTE — H&P (Addendum)
[facility] History and Physical  Michaela Shaw RXY:[id number] DOB: 11/09/1940 DOA: 11/05/2014  Referring physician: ED physician PCP: [name], NP  Specialists:   Chief Complaint: Nausea, vomiting, abdominal pain.  HPI: Michaela Shaw is a 74 y.o. female with past medical history of hypertension, hyperlipidemia, diabetes mellitus, history of stroke, GERD, chronic kidney disease-stage III, non-small cell lung cancer-stage IV (diagnosed on  March 3rd, with first Chemo was 09/13/14 and last chemo on 10/25/14), PAD, who presents with nausea, vomiting and abdominal pain.  Patient reports that he has generalized weakness since started doing chemotherapy. On 11/03/14, she started having nausea, vomiting and abdominal pain. No diarrhea. She reports emesis is NBNB. Patient family reports that her energy has been declining. She reports associated sore throat and oral lesions persistent since her onset of chemotherapy. She denies subjective fever and chills, but her temperature is 100.2 on admission. Patient has mild dry cough and mild shortness of breath, but no chest pain. Her abdominal pain is diffused and generalized pain. Of note, patient received Neupogen 2 weeks ago and 2 units of blood 3 days ago. Currently patient denies subjective fever, chills, running nose, ear pain, headaches, diarrhea, dysuria, urgency, frequency, hematuria, skin rashes, joint pain or leg swelling. No unilateral weakness, numbness or tingling sensations. No vision change or hearing loss.    In ED, patient was found to have WBC 0.2, temperature 100.2, tachycardia, thrombocytopenia with platelets of 29, AoCKD-III. CXR showed increasing right pleural effusion and right perihilar opacity which is similar to prior PET-CT. CT-abd/pelvis showed marked mural thickening of the duodenum and proximal jejunum without obstruction which may represent enteritis, enlargement of the left adrenal metastasis.   Review of Systems:    Constitutional: No subjective fever or chills; Appetite poor, has fatigue.  HEENT: No blurry vision, no diplopia, no pharyngitis, no dysphagia  CV: No chest pain, no palpitations, no PND, no orthopnea, no edema. Resp: has SOB and cough, no pleuritic pain. GI: has nausea, vomiting and abdominal pain. No diarrhea, no melena, no hematochezia GU: No dysuria, no hematuria, no frequency, no urgency.  MSK: no myalgias, no arthralgias.  Neuro: No headache, no focal neurological deficits, no history of seizures. Psych: No suicidal or homicidal idiation Endo: No heat intolerance, no cold intolerance, no polyuria, no polydipsia  Skin: No rashes, no skin lesions.  Heme: No easy bruising.  Travel history:No recent long distant travel.   Where does patient live?  At home Can patient participate in ADLs? barely  Allergy:  Allergies  Allergen Reactions  . Metformin And Related Other (See Comments)    Kidney function    Past Medical History  Diagnosis Date  . History of chicken pox   . Diabetes mellitus without complication   . Hyperlipidemia   . Hypertension   . 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 Seizures 2005  . Stroke 2011    "mild" per pt.   Marland Kitchen GERD (gastroesophageal reflux disease)   . Leg swelling     Left leg; cause is unknown; however pt stated it goes away at rest  . Impaired circulation of right leg     Takes Pletal  . CKD (chronic kidney disease)   . Lung cancer     Past Surgical History  Procedure Laterality Date  . Bladder suspension  1980  . Abdominal hysterectomy  1980    partial  . Cholecystectomy  2007  . Appendectomy  2007  . Spine surgery  2012    Has rods and pins  . Cataract extraction, bilateral    . Colonoscopy    . Upper gi endoscopy    . Video bronchoscopy with endobronchial ultrasound N/A 08/23/2014    Procedure: VIDEO BRONCHOSCOPY WITH ENDOBRONCHIAL ULTRASOUND with Biopsy;  Surgeon: Rigoberto Noel,  MD;  Location: [place];  Service: Thoracic;  Laterality: N/A;    Social History:  reports that she has never smoked. She has never used smokeless tobacco. She reports that she does not drink alcohol or use illicit drugs.  Family History:  Family History  Problem Relation Age of Onset  . Heart disease Mother   . Diabetes Mother   . Alzheimer's disease Father   . Cancer Sister     colon  . Diabetes Sister   . Diabetes Daughter   . Diabetes Sister      Prior to Admission medications   Medication Sig Start Date End Date Taking? Authorizing Provider  alendronate (FOSAMAX) 70 MG tablet Take 1 tablet (70 mg total) by mouth every 7 (seven) days. Take with a full glass of water on an empty stomach. 07/13/14  Yes Debbrah Alar, NP  Alum & Mag Hydroxide-Simeth (MAGIC MOUTHWASH W/LIDOCAINE) SOLN Take 5 mLs by mouth 4 (four) times daily as needed for mouth pain. 10/18/14  Yes Susanne Borders, NP  amLODipine (NORVASC) 10 MG tablet Take 10 mg by mouth daily.  07/02/14  Yes Historical Provider, MD  aspirin 81 MG tablet Take 81 mg by mouth daily.   Yes Historical Provider, MD  Blood Glucose Monitoring Suppl (ONETOUCH VERIO) W/DEVICE KIT 1 each by Does not apply route daily. Please use to test blood sugar 4 times as instructed. Dx code: E11.40 04/26/14  Yes Philemon Kingdom, MD  Calcium Carbonate-Vitamin D (CALCIUM 600 + D PO) Take 1 tablet by mouth every morning.   Yes Historical Provider, MD  cilostazol (PLETAL) 100 MG tablet Take 1 tablet (100 mg total) by mouth 2 (two) times daily before a meal. 02/21/14  Yes Serafina Mitchell, MD  cloNIDine (CATAPRES) 0.2 MG tablet TAKE ONE TABLET BY MOUTH TWICE DAILY 07/02/14  Yes Debbrah Alar, NP  co-enzyme Q-10 30 MG capsule Take 100 mg by mouth daily.   Yes Historical Provider, MD  dexamethasone (DECADRON) 4 MG tablet 4 mg by mouth twice a day the day before, day of and day after the chemotherapy every 3 weeks Patient taking differently: 4 mg by mouth twice a  day the day before, day of and day after the chemotherapy every 3 weeks only if needed for nausea, due to raising blood sugar level 09/08/14  Yes Curt Bears, MD  fenofibrate (TRICOR) 145 MG tablet TAKE ONE TABLET BY MOUTH ONCE DAILY 08/01/14  Yes Debbrah Alar, NP  ferrous sulfate 325 (65 FE) MG tablet Take 325 mg by mouth 3 (three) times daily with meals.    Yes Historical Provider, MD  folic acid (FOLVITE) 1 MG tablet Take 1 tablet (1 mg total) by mouth daily. 09/08/14  Yes Curt Bears, MD  glucose blood (ONETOUCH VERIO) test strip Use to test blood sugar 4 times daily as instructed. Dx code: E11.40 04/26/14  Yes Philemon Kingdom, MD  HYDROcodone-homatropine [facility]) 5-1.5 MG/5ML syrup Take 5 mLs by mouth every 6 (six) hours as needed for cough. 10/10/14  Yes Adrena E Johnson, PA-C  insulin lispro (HUMALOG) 100 UNIT/ML injection To use in VGo 20 (up to 60 units daily) Patient taking differently: Inject 10-12 units in  the morning, 10-12 units at lunch and 10-12 units at supper 04/26/14  Yes Philemon Kingdom, MD  insulin NPH Human (HUMULIN N,NOVOLIN N) 100 UNIT/ML injection Inject 15 units into the skin before breakfast, mixed with the Regular insulin and 10 units into the skin before dinner, mixed with the Regular insulin. Patient taking differently: Inject 20 units into the skin before breakfast,  10 units at supper 07/19/14  Yes Philemon Kingdom, MD  Insulin Syringe-Needle U-100 (RELION INSULIN SYR 0.3CC/30G) 30G X 5/16" 0.3 ML MISC Inject 1 each into the skin 2 (two) times daily.   Yes Historical Provider, MD  Insulin Syringe-Needle U-100 31G X 5/16" 0.5 ML MISC Use to inject insulin 3 times daily. 07/19/14  Yes Philemon Kingdom, MD  omeprazole (PRILOSEC) 20 MG capsule TAKE ONE CAPSULE BY MOUTH IN THE MORNING 06/06/14  Yes Debbrah Alar, NP  ONETOUCH DELICA LANCETS 19X MISC Use to test blood sugar 4 times daily as instructed. 04/26/14  Yes Philemon Kingdom, MD  potassium chloride SA  (K-DUR,KLOR-CON) 20 MEQ tablet Take 1 tablet (20 mEq total) by mouth daily. 10/26/14  Yes Curt Bears, MD  prochlorperazine (COMPAZINE) 10 MG tablet Take 1 tablet (10 mg total) by mouth every 6 (six) hours as needed for nausea or vomiting. 09/08/14  Yes Curt Bears, MD  rosuvastatin (CRESTOR) 20 MG tablet Take 1 tablet (20 mg total) by mouth daily.   Yes Debbrah Alar, NP  sucralfate (CARAFATE) 1 GM/10ML suspension Take 10 mLs (1 g total) by mouth 4 (four) times daily -  with meals and at bedtime. 10/18/14  Yes Susanne Borders, NP  nystatin (MYCOSTATIN) 100000 UNIT/ML suspension Use as directed 5 mLs in the mouth or throat.  10/18/14   Historical Provider, MD    Physical Exam: Filed Vitals:   11/06/14 0100 11/06/14 0130 11/06/14 0200 11/06/14 0243  BP: 144/119 149/95 153/79 137/77  Pulse:    110  Temp:    99.1 F (37.3 C)  TempSrc:    Oral  Resp: 30  35 24  Height:    4' 11" (1.499 m)  Weight:    52.9 kg (116 lb 10 oz)  SpO2: 100%   100%   General: Not in acute distress HEENT:       Eyes: PERRL, EOMI, no scleral icterus       ENT: No discharge from the ears and nose, no pharynx injection, no tonsillar enlargement.        Neck: No JVD, no bruit, no mass felt. Heme: No neck or axillary lymph node enlargement Cardiac: S1/S2, RRR, No murmurs, No gallops or rubs Pulm: Severely air movement on the right side.  No rales, wheezing, rhonchi or rubs. Abd: Soft, nondistended, nontender, no rebound pain, no organomegaly, BS present Ext: No edema bilaterally. 2+DP/PT pulse bilaterally Musculoskeletal: No joint deformities, erythema, or stiffness, ROM full Skin: No rashes.  Neuro: Alert and oriented X3, cranial nerves II-XII grossly intact, muscle strength 5/5 in all extremeties, sensation to light touch intact. Psych: Patient is not psychotic, no suicidal or hemocidal ideation.  Labs on Admission:  Basic Metabolic Panel:  Recent Labs Lab 11/01/14 1001 11/05/14 2010 11/06/14 0305   NA 136 135 133*  K 3.4* 3.4* 2.8*  CL  --  99* 106  CO2 19* 20* 17*  GLUCOSE 269* 555* 482*  BUN 24.8 47* 45*  CREATININE 1.9* 2.27* 2.16*  CALCIUM 8.8 8.7* 7.4*   Liver Function Tests:  Recent Labs Lab 11/01/14 1001 11/05/14 2010 11/06/14  0305  AST 35* 26 35  ALT _0 ALKPHOS 105 116 111  BILITOT 0.39 1.1 1.1  PROT 7.5 7.6 6.5  ALBUMIN 2.7* 3.0* 2.6*    Recent Labs Lab 11/05/14 2010  LIPASE 26   No results for input(s): AMMONIA in the last 168 hours. CBC:  Recent Labs Lab 11/01/14 1000 11/05/14 2010 11/06/14 0305  WBC 1.5* 0.2* 0.1*  NEUTROABS 1.1* 0.0*  --   HGB 7.1* 12.0 10.0*  HCT 21.1* 34.6* 29.1*  MCV 78.7* 83.2 84.6  PLT 501* 29* 14*   Cardiac Enzymes: No results for input(s): CKTOTAL, CKMB, CKMBINDEX, TROPONINI in the last 168 hours.  BNP (last 3 results) No results for input(s): BNP in the last 8760 hours.  ProBNP (last 3 results) No results for input(s): PROBNP in the last 8760 hours.  CBG: No results for input(s): GLUCAP in the last 168 hours.  Radiological Exams on Admission: Ct Abdomen Pelvis Wo Contrast  11/06/2014   CLINICAL DATA:  Gradual persistent myalgias, nausea and vomiting.  EXAM: CT ABDOMEN AND PELVIS WITHOUT CONTRAST  TECHNIQUE: Multidetector CT imaging of the abdomen and pelvis was performed following the standard protocol without IV contrast.  COMPARISON:  CT 08/06/2014, CT 07/24/2014  FINDINGS: There is a large right pleural effusion, substantially increased from the prior imaging studies.  The left adrenal nodule has enlarged, measuring 10 x 16 mm and previously measuring 7 x 12 mm.  There is abnormal mural thickening of the duodenum and proximal jejunum. This is nonobstructive. Oral contrast has passed through to the terminal ileum. This may represent enteritis. There is no ascites. There is no extraluminal air.  There are unremarkable unenhanced appearances of the liver, spleen, pancreas, right adrenal and kidneys with the  exception of a few nonobstructing collecting system calculi.  There is no adenopathy in the abdomen or pelvis. The abdominal aorta is normal in caliber.  There is prior cholecystectomy, appendectomy and hysterectomy.  The known skeletal lesions are not significantly changed. There is no interval change in the benign appearing moderate compression of T11.  IMPRESSION: *Marked mural thickening of the duodenum and proximal jejunum without obstruction. This may represent enteritis. *Enlargement of the left adrenal metastasis *Large right pleural effusion, significantly increased from 08/06/2014   Electronically Signed   By: Andreas Newport M.D.   On: 11/06/2014 00:55   Dg Chest Port 1 View  11/05/2014   CLINICAL DATA:  Fever and vomiting 48 hr  EXAM: PORTABLE CHEST - 1 VIEW  COMPARISON:  PET-CT scan 08/06/2014, radiograph 08/23/2014.  FINDINGS: Normal cardiac silhouette. There is a moderate right pleural effusions increased in volume compared to prior. This effusion slightly partially loculated. There is central venous congestion. A perihilar opacity similar to comparison PET-CT scan.  IMPRESSION: Increasing right pleural effusion. Right perihilar opacity similar to prior PET-CT   Electronically Signed   By: Suzy Bouchard M.D.   On: 11/05/2014 22:12    EKG: Independently reviewed.  Abnormal findings:  QTC 539, mild T-wave inversion in inferior leads and V4 to V6. Assessment/Plan Principal Problem:   Neutropenia with fever Active Problems:   Type 2 diabetes mellitus with neurological manifestations, uncontrolled   HTN (hypertension)   HLD (hyperlipidemia)   Diabetic retinopathy   PVD (peripheral vascular disease)   Anemia, iron deficiency   Non-small cell carcinoma of lung, stage 4   Chemotherapy induced neutropenia   Anorexia   Thrombocytopenia   Sepsis   Acute renal failure superimposed on stage  3 chronic kidney disease   Hypokalemia  Addendum: I was called to see patient by RN because  patient has wheezing and shortness of breath. On examination, patient has diffuse wheezing bilaterally. Patient does not have chest pain. Per family, patient does not have history of asthma. She has mild wheezing recently. She has remote history of smoking. Etiology is not clear for her wheezing, likely due to lung cancer and possible compression of air way. -will transfer pt to SDU -Breathing treatment: DuoNeb every 4 hours, albuterol every 2 hours when necessary, give 1 dose of Solu-Medrol, 60 mg IV  Addendum:  The repeated lactic acid is 3.4. Will give another 1L of NS bolus.  Neutropenia fever and sepsis: WBC 0.2, temperature 100.2. She has nausea and vomiting, likely due to enteritis as evidenced by the CT abdomen/pelvis. Patient is septic on admission with tachycardia, fever and elevated lactate 2.30. She is hemodynamically stable on admission. Patient has cough and SOB, but chest x-ray did not show new infiltration. Her cough and shortness of breath are likely secondary to lung cancer. UA is negative for UTI. - will admit to tele bed.  -Patient received vancomycin and Zosyn in  emergency room. Will switch to vancomycin and cefepime -blood culture x2 and urine culutre -will get Procalcitonin and trend lactic acid level per sepsis protocol -IVF: received 2.5L of NS bolus in ED, followed by 75 cc/h  Nausea, vomiting and abdominal pain: Likely due to possible enteritis as evidenced by the CT-abdomen rest pelvis. Intrabdominal metastasis of lung cancer may have also contributed. Lipase negative. Urinalysis also negative. -Hydroxyzine for nausea (QTc interval 539, not good candidate for Zofran) -IV fluid as above -Monitor electrolytes by BMP  AoCKD-III: Baseline creatinine 1.2. His creatinine is 2.27, BUN 47, is likely due to prerenal failure. No hydronephrosis on CT abdomen/pelvis - Check a Fina - IV fluids as above - Follow-up renal function by BMP  Non-small cell lung cancer-stage IV:  Patient started chemotherapy on 09/06/14, last dose was at 10/25/14. Patient is followed up by Dr. Julien Nordmann. -will let dr. Julien Nordmann know her admission in AM  Diabetes mellitus: Patient is taking sliding scale insulin at home. A1c 7.3 on 09/10/14 -Start Lantus 18 units daily  -sliding scale insulin-moderate scale  Thrombocytopenia: Secondary to chemotherapy. Platelet 29 on admission. No bleeding tendency. -Type and screen - hold platelet transfusion - Repeat CBC morning - check peripheral smear  Hypertension: -Hold amlodipine. Patient has sepsis and is at risk of developing hypotension -Continue clonidine -IV hydralazine when necessary  GERD: -Protonix and Carafate  Hyperlipidemia: LDL was a 84 on 03/14/14 -Continue Crestor  Hypokalemia: -Repleted -Chek magnesium level   DVT ppx: SCD  Code Status: Partial code (OK with CPR, but no intubation) Family Communication:  Yes, patient's   daughter    at bed side Disposition Plan: Admit to inpatient   Date of Service 11/06/2014    Ivor Costa [facility] Pager [phone]  If 7PM-7AM, please contact night-coverage [email or web address] Password [email or web address] 11/06/2014, 4:56 AM

## 2014-11-05 NOTE — ED Notes (Signed)
Phlebotomy at bedside obtaining blood cultures

## 2014-11-05 NOTE — ED Notes (Signed)
Patient's family reports that she has been vomiting all weekend and received a blood transfusion 3 days PTA.  Patient reports "hurting all over."

## 2014-11-05 NOTE — Telephone Encounter (Signed)
PT. HAS NOT TAKEN HER PROCHLORPERAZINE. SHE HAS HAD 12 OUNCES OF FLUIDS IN THE PAST 24 HOURS. PT. IS NOT EATING. UNSURE IS PT. HAS HAD ANY DIARRHEA. SHE IS EXTREMELY WEAK.VERBAL ORDER AND READ BACK TO CINDEE BACON,NP- PT. NEEDS TO TAKE THE PROCHLORPERAZINE. SHE NEEDS TO FORCE FLUIDS TO 64 OUNCES IN A 24 HOUR PERIOD. IF CONDITION WORSENS PT. TO GO TO THE EMERGENCY DEPARTMENT FOR AN EVALUATION. NOTIFIED PT.'S DAUGHTER OF THE ABOVE INFORMATION. PT.'S DAUGHTER WILL CALL TOMORROW MORNING WITH AN UPDATE ON PT.'S CONDITION.

## 2014-11-05 NOTE — ED Notes (Signed)
Admission MD at bedside.  

## 2014-11-05 NOTE — ED Notes (Signed)
Only one set of antibiotics were able to be obtained, antibiotics will be started to meet code sepsis parameters

## 2014-11-05 NOTE — ED Notes (Signed)
IV attempted x2, second RN to bedside

## 2014-11-06 ENCOUNTER — Telehealth: Payer: Self-pay | Admitting: *Deleted

## 2014-11-06 ENCOUNTER — Other Ambulatory Visit: Payer: Self-pay

## 2014-11-06 ENCOUNTER — Inpatient Hospital Stay (HOSPITAL_COMMUNITY): Payer: Medicare Other

## 2014-11-06 DIAGNOSIS — E860 Dehydration: Secondary | ICD-10-CM

## 2014-11-06 DIAGNOSIS — E440 Moderate protein-calorie malnutrition: Secondary | ICD-10-CM | POA: Diagnosis present

## 2014-11-06 DIAGNOSIS — C7970 Secondary malignant neoplasm of unspecified adrenal gland: Secondary | ICD-10-CM

## 2014-11-06 DIAGNOSIS — D6959 Other secondary thrombocytopenia: Secondary | ICD-10-CM

## 2014-11-06 DIAGNOSIS — C7951 Secondary malignant neoplasm of bone: Secondary | ICD-10-CM

## 2014-11-06 LAB — CBC
HCT: 29.1 % — ABNORMAL LOW (ref 36.0–46.0)
Hemoglobin: 10 g/dL — ABNORMAL LOW (ref 12.0–15.0)
MCH: 29.1 pg (ref 26.0–34.0)
MCHC: 34.4 g/dL (ref 30.0–36.0)
MCV: 84.6 fL (ref 78.0–100.0)
Platelets: 14 10*3/uL — CL (ref 150–400)
RBC: 3.44 MIL/uL — ABNORMAL LOW (ref 3.87–5.11)
RDW: 14.1 % (ref 11.5–15.5)
WBC: 0.1 10*3/uL — CL (ref 4.0–10.5)

## 2014-11-06 LAB — COMPREHENSIVE METABOLIC PANEL
ALK PHOS: 149 U/L — AB (ref 38–126)
ALT: 24 U/L (ref 14–54)
ALT: 44 U/L (ref 14–54)
ANION GAP: 10 (ref 5–15)
AST: 35 U/L (ref 15–41)
AST: 66 U/L — AB (ref 15–41)
Albumin: 2.6 g/dL — ABNORMAL LOW (ref 3.5–5.0)
Albumin: 2.4 g/dL — ABNORMAL LOW (ref 3.5–5.0)
Alkaline Phosphatase: 111 U/L (ref 38–126)
Anion gap: 8 (ref 5–15)
BUN: 45 mg/dL — ABNORMAL HIGH (ref 6–20)
BUN: 39 mg/dL — ABNORMAL HIGH (ref 6–20)
CALCIUM: 7.4 mg/dL — AB (ref 8.9–10.3)
CALCIUM: 7.1 mg/dL — AB (ref 8.9–10.3)
CO2: 17 mmol/L — ABNORMAL LOW (ref 22–32)
CO2: 16 mmol/L — AB (ref 22–32)
CREATININE: 2.16 mg/dL — AB (ref 0.44–1.00)
Chloride: 106 mmol/L (ref 101–111)
Chloride: 114 mmol/L — ABNORMAL HIGH (ref 101–111)
Creatinine, Ser: 1.79 mg/dL — ABNORMAL HIGH (ref 0.44–1.00)
GFR calc non Af Amer: 21 mL/min — ABNORMAL LOW (ref >60)
GFR, EST AFRICAN AMERICAN: 25 mL/min — AB (ref >60)
GFR, EST AFRICAN AMERICAN: 31 mL/min — AB (ref >60)
GFR, EST NON AFRICAN AMERICAN: 27 mL/min — AB (ref >60)
GLUCOSE: 482 mg/dL — AB (ref 70–99)
Glucose, Bld: 240 mg/dL — ABNORMAL HIGH (ref 70–99)
POTASSIUM: 2.8 mmol/L — AB (ref 3.5–5.1)
Potassium: 3.4 mmol/L — ABNORMAL LOW (ref 3.5–5.1)
SODIUM: 133 mmol/L — AB (ref 135–145)
Sodium: 138 mmol/L (ref 135–145)
Total Bilirubin: 1.1 mg/dL (ref 0.3–1.2)
Total Bilirubin: 0.9 mg/dL (ref 0.3–1.2)
Total Protein: 6.5 g/dL (ref 6.5–8.1)
Total Protein: 6.3 g/dL — ABNORMAL LOW (ref 6.5–8.1)

## 2014-11-06 LAB — CREATININE, URINE, RANDOM: Creatinine, Urine: 87.48 mg/dL

## 2014-11-06 LAB — TYPE AND SCREEN
ABO/RH(D): B NEG
Antibody Screen: NEGATIVE

## 2014-11-06 LAB — APTT: APTT: 25 s (ref 24–37)

## 2014-11-06 LAB — PROCALCITONIN: PROCALCITONIN: 2.06 ng/mL

## 2014-11-06 LAB — LACTIC ACID, PLASMA
Lactic Acid, Venous: 3.4 mmol/L (ref 0.5–2.0)
Lactic Acid, Venous: 2.6 mmol/L (ref 0.5–2.0)
Lactic Acid, Venous: 2 mmol/L (ref 0.5–2.0)

## 2014-11-06 LAB — MAGNESIUM: Magnesium: 1.3 mg/dL — ABNORMAL LOW (ref 1.7–2.4)

## 2014-11-06 LAB — MRSA PCR SCREENING: MRSA by PCR: NEGATIVE

## 2014-11-06 LAB — SODIUM, URINE, RANDOM: Sodium, Ur: <10 mmol/L

## 2014-11-06 LAB — GLUCOSE, CAPILLARY
GLUCOSE-CAPILLARY: 279 mg/dL — AB (ref 70–99)
GLUCOSE-CAPILLARY: 215 mg/dL — AB (ref 70–99)
GLUCOSE-CAPILLARY: 199 mg/dL — AB (ref 70–99)

## 2014-11-06 LAB — PROTIME-INR
INR: 1.13 (ref 0.00–1.49)
Prothrombin Time: 14.6 seconds (ref 11.6–15.2)

## 2014-11-06 MED ORDER — CALCIUM CARBONATE-VITAMIN D 500-200 MG-UNIT PO TABS
1.0000 | ORAL_TABLET | Freq: Every morning | ORAL | Status: DC
  Administered 2014-11-07 – 2014-11-13 (×4): 1 via ORAL
  Filled 2014-11-06 (×6): qty 1

## 2014-11-06 MED ORDER — SODIUM CHLORIDE 0.9 % IJ SOLN
3.0000 mL | Freq: Two times a day (BID) | INTRAMUSCULAR | Status: DC
  Administered 2014-11-08: 10 mL via INTRAVENOUS
  Administered 2014-11-11: 3 mL via INTRAVENOUS

## 2014-11-06 MED ORDER — FUROSEMIDE 10 MG/ML IJ SOLN
60.0000 mg | Freq: Once | INTRAMUSCULAR | Status: AC
  Administered 2014-11-06: 60 mg via INTRAVENOUS
  Filled 2014-11-06: qty 6

## 2014-11-06 MED ORDER — CHLORHEXIDINE GLUCONATE 0.12 % MT SOLN
15.0000 mL | Freq: Two times a day (BID) | OROMUCOSAL | Status: DC
  Administered 2014-11-06 – 2014-11-12 (×6): 15 mL via OROMUCOSAL
  Filled 2014-11-06 (×11): qty 15

## 2014-11-06 MED ORDER — ALBUTEROL SULFATE (2.5 MG/3ML) 0.083% IN NEBU
2.5000 mg | INHALATION_SOLUTION | RESPIRATORY_TRACT | Status: AC
  Administered 2014-11-06: 2.5 mg via RESPIRATORY_TRACT

## 2014-11-06 MED ORDER — SODIUM CHLORIDE 0.9 % IV BOLUS (SEPSIS)
1000.0000 mL | INTRAVENOUS | Status: AC
  Administered 2014-11-06: 09:00:00 via INTRAVENOUS
  Administered 2014-11-06: 1000 mL via INTRAVENOUS

## 2014-11-06 MED ORDER — POTASSIUM CHLORIDE 10 MEQ/100ML IV SOLN
10.0000 meq | INTRAVENOUS | Status: AC
  Administered 2014-11-06 (×4): 10 meq via INTRAVENOUS
  Filled 2014-11-06 (×4): qty 100

## 2014-11-06 MED ORDER — PANTOPRAZOLE SODIUM 40 MG PO TBEC
40.0000 mg | DELAYED_RELEASE_TABLET | Freq: Every day | ORAL | Status: DC
  Administered 2014-11-07: 40 mg via ORAL
  Filled 2014-11-06: qty 1

## 2014-11-06 MED ORDER — POTASSIUM CHLORIDE 20 MEQ/15ML (10%) PO SOLN: 20.0000 meq | Freq: Once | ORAL | Status: DC

## 2014-11-06 MED ORDER — MAGNESIUM SULFATE 2 GM/50ML IV SOLN
2.0000 g | Freq: Once | INTRAVENOUS | Status: AC
  Administered 2014-11-06: 2 g via INTRAVENOUS
  Filled 2014-11-06 (×2): qty 50

## 2014-11-06 MED ORDER — FUROSEMIDE 10 MG/ML IJ SOLN
40.0000 mg | Freq: Once | INTRAMUSCULAR | Status: AC
  Administered 2014-11-06: 40 mg via INTRAVENOUS
  Filled 2014-11-06: qty 4

## 2014-11-06 MED ORDER — CLONIDINE HCL 0.1 MG PO TABS
0.2000 mg | ORAL_TABLET | Freq: Two times a day (BID) | ORAL | Status: DC
  Administered 2014-11-06 – 2014-11-13 (×14): 0.2 mg via ORAL
  Filled 2014-11-06 (×14): qty 2

## 2014-11-06 MED ORDER — CETYLPYRIDINIUM CHLORIDE 0.05 % MT LIQD
7.0000 mL | Freq: Two times a day (BID) | OROMUCOSAL | Status: DC
  Administered 2014-11-06 – 2014-11-13 (×13): 7 mL via OROMUCOSAL

## 2014-11-06 MED ORDER — IPRATROPIUM-ALBUTEROL 0.5-2.5 (3) MG/3ML IN SOLN
3.0000 mL | RESPIRATORY_TRACT | Status: DC
  Administered 2014-11-06: 3 mL via RESPIRATORY_TRACT
  Filled 2014-11-06 (×2): qty 3

## 2014-11-06 MED ORDER — FENOFIBRATE 160 MG PO TABS
160.0000 mg | ORAL_TABLET | Freq: Every day | ORAL | Status: DC
  Administered 2014-11-07 – 2014-11-13 (×5): 160 mg via ORAL
  Filled 2014-11-06 (×9): qty 1

## 2014-11-06 MED ORDER — FERROUS SULFATE 325 (65 FE) MG PO TABS
325.0000 mg | ORAL_TABLET | Freq: Three times a day (TID) | ORAL | Status: DC
  Administered 2014-11-07 – 2014-11-13 (×18): 325 mg via ORAL
  Filled 2014-11-06 (×18): qty 1

## 2014-11-06 MED ORDER — COENZYME Q10 30 MG PO CAPS: 100.0000 mg | ORAL_CAPSULE | Freq: Every day | ORAL | Status: DC

## 2014-11-06 MED ORDER — FOLIC ACID 1 MG PO TABS
1.0000 mg | ORAL_TABLET | Freq: Every day | ORAL | Status: DC
  Administered 2014-11-07 – 2014-11-13 (×7): 1 mg via ORAL
  Filled 2014-11-06 (×7): qty 1

## 2014-11-06 MED ORDER — METHYLPREDNISOLONE SODIUM SUCC 125 MG IJ SOLR
80.0000 mg | Freq: Once | INTRAMUSCULAR | Status: AC
  Administered 2014-11-06: 80 mg via INTRAVENOUS
  Filled 2014-11-06: qty 2

## 2014-11-06 MED ORDER — ALBUTEROL SULFATE (2.5 MG/3ML) 0.083% IN NEBU
2.5000 mg | INHALATION_SOLUTION | RESPIRATORY_TRACT | Status: DC | PRN
  Administered 2014-11-11: 2.5 mg via RESPIRATORY_TRACT
  Filled 2014-11-06: qty 3

## 2014-11-06 MED ORDER — HYDROMORPHONE HCL 1 MG/ML IJ SOLN: 0.5000 mg | INTRAMUSCULAR | Status: AC | PRN

## 2014-11-06 MED ORDER — ROSUVASTATIN CALCIUM 20 MG PO TABS
20.0000 mg | ORAL_TABLET | Freq: Every day | ORAL | Status: DC
  Administered 2014-11-07 – 2014-11-11 (×5): 20 mg via ORAL
  Filled 2014-11-06 (×5): qty 1

## 2014-11-06 MED ORDER — ACETAMINOPHEN 650 MG RE SUPP
650.0000 mg | Freq: Four times a day (QID) | RECTAL | Status: DC | PRN
  Administered 2014-11-06: 650 mg via RECTAL
  Filled 2014-11-06: qty 1

## 2014-11-06 MED ORDER — SODIUM CHLORIDE 0.9 % IJ SOLN
10.0000 mL | Freq: Two times a day (BID) | INTRAMUSCULAR | Status: DC
  Administered 2014-11-06: 20 mL
  Administered 2014-11-06 – 2014-11-12 (×12): 10 mL

## 2014-11-06 MED ORDER — SUCRALFATE 1 GM/10ML PO SUSP
1.0000 g | Freq: Three times a day (TID) | ORAL | Status: DC
  Administered 2014-11-06 – 2014-11-07 (×5): 1 g via ORAL
  Filled 2014-11-06 (×6): qty 10

## 2014-11-06 MED ORDER — ACETAMINOPHEN 325 MG PO TABS: 650.0000 mg | ORAL_TABLET | Freq: Four times a day (QID) | ORAL | Status: DC | PRN

## 2014-11-06 MED ORDER — HYDROCODONE-HOMATROPINE 5-1.5 MG/5ML PO SYRP
5.0000 mL | ORAL_SOLUTION | Freq: Four times a day (QID) | ORAL | Status: DC | PRN
  Administered 2014-11-07: 5 mL via ORAL
  Filled 2014-11-06: qty 5

## 2014-11-06 MED ORDER — HYDRALAZINE HCL 20 MG/ML IJ SOLN
5.0000 mg | INTRAMUSCULAR | Status: DC | PRN
  Administered 2014-11-06: 5 mg via INTRAVENOUS
  Filled 2014-11-06: qty 1

## 2014-11-06 MED ORDER — SODIUM CHLORIDE 0.9 % IJ SOLN
10.0000 mL | INTRAMUSCULAR | Status: DC | PRN
  Administered 2014-11-13 (×3): 10 mL
  Filled 2014-11-06 (×3): qty 40

## 2014-11-06 MED ORDER — SODIUM CHLORIDE 0.9 % IV BOLUS (SEPSIS)
1000.0000 mL | Freq: Once | INTRAVENOUS | Status: AC
  Administered 2014-11-06: 1000 mL via INTRAVENOUS

## 2014-11-06 MED ORDER — TBO-FILGRASTIM 300 MCG/0.5ML ~~LOC~~ SOSY
300.0000 ug | PREFILLED_SYRINGE | Freq: Every morning | SUBCUTANEOUS | Status: AC
  Administered 2014-11-06 – 2014-11-08 (×2): 300 ug via SUBCUTANEOUS
  Filled 2014-11-06 (×5): qty 0.5

## 2014-11-06 MED ORDER — ASPIRIN 81 MG PO CHEW: 81.0000 mg | CHEWABLE_TABLET | Freq: Every day | ORAL | Status: DC

## 2014-11-06 MED ORDER — CILOSTAZOL 100 MG PO TABS
100.0000 mg | ORAL_TABLET | Freq: Two times a day (BID) | ORAL | Status: DC
  Administered 2014-11-07: 100 mg via ORAL
  Filled 2014-11-06 (×4): qty 1

## 2014-11-06 MED ORDER — IPRATROPIUM-ALBUTEROL 0.5-2.5 (3) MG/3ML IN SOLN
3.0000 mL | Freq: Four times a day (QID) | RESPIRATORY_TRACT | Status: DC
  Administered 2014-11-06 – 2014-11-09 (×11): 3 mL via RESPIRATORY_TRACT
  Filled 2014-11-06 (×11): qty 3

## 2014-11-06 MED ORDER — FLUCONAZOLE 100MG IVPB
100.0000 mg | INTRAVENOUS | Status: DC
  Administered 2014-11-06 – 2014-11-13 (×8): 100 mg via INTRAVENOUS
  Filled 2014-11-06 (×10): qty 50

## 2014-11-06 MED ORDER — MAGIC MOUTHWASH W/LIDOCAINE
5.0000 mL | Freq: Four times a day (QID) | ORAL | Status: DC | PRN
  Administered 2014-11-09 – 2014-11-11 (×4): 5 mL via ORAL
  Filled 2014-11-06 (×6): qty 5

## 2014-11-06 MED ORDER — ALBUTEROL SULFATE (2.5 MG/3ML) 0.083% IN NEBU
INHALATION_SOLUTION | RESPIRATORY_TRACT | Status: AC
  Filled 2014-11-06: qty 3

## 2014-11-06 MED ORDER — INSULIN ASPART 100 UNIT/ML ~~LOC~~ SOLN
0.0000 [IU] | Freq: Three times a day (TID) | SUBCUTANEOUS | Status: DC
  Administered 2014-11-06: 5 [IU] via SUBCUTANEOUS
  Administered 2014-11-07: 2 [IU] via SUBCUTANEOUS

## 2014-11-06 MED ORDER — HYDROXYZINE HCL 50 MG/ML IM SOLN
25.0000 mg | Freq: Four times a day (QID) | INTRAMUSCULAR | Status: DC | PRN
  Filled 2014-11-06: qty 0.5

## 2014-11-06 MED ORDER — DEXTROSE 5 % IV SOLN
2.0000 g | Freq: Every day | INTRAVENOUS | Status: DC
  Administered 2014-11-06 – 2014-11-08 (×4): 2 g via INTRAVENOUS
  Filled 2014-11-06 (×4): qty 2

## 2014-11-06 MED ORDER — POTASSIUM CHLORIDE 20 MEQ/15ML (10%) PO SOLN
40.0000 meq | Freq: Once | ORAL | Status: AC
  Administered 2014-11-06: 40 meq via ORAL
  Filled 2014-11-06: qty 30

## 2014-11-06 MED ORDER — IOHEXOL 300 MG/ML  SOLN
50.0000 mL | Freq: Once | INTRAMUSCULAR | Status: AC | PRN
  Administered 2014-11-05: 50 mL via ORAL

## 2014-11-06 NOTE — Consult Note (Signed)
Michaela Shaw   DOB:08/02/1940   KG#:[id number]   FWY#:[id number]  Patient Care Team: Debbrah Alar, NP as PCP - General (Internal Medicine)  Subjective:  74 year old woman with a history of Non-small cell carcinoma of lung, stage 4, s/p C 3 Carboplatin and Alimta on 4/21, admitted on 5/2 with 3 day history of nausea, vomiting and  diffuse abdominal pain. No diarrhea was reported. She also complained of mucositis.She was febrile on admission with temperature of 100.2.She had someshortness of breath at res with dry cough. Denies any chest pain or palpitations. Denies lower extremity swelling.Denies any dysuria. Denies abnormal skin rashes, or neuropathy. Denies any bleeding issues such as epistaxis, hematemesis, hematuria or hematochezia. She had symptomatic anemia prior to presentation, receiving 2 units as outpatient on 4/20. She had some myalgias due to Neulasta.  In ED, patient was found to have WBC 0.2, thrombocytopenia with platelets of 29.CXR showed increasing right pleural effusion and right perihilar opacity which is similar to prior PET-CT. CT-abd/pelvis showed marked mural thickening of the duodenum and proximal jejunum without obstruction which may represent enteritis, enlargement of the left adrenal metastasis. Her status was complicated with wheezing requiring transfer to the SDU, requiring nebs, O2 therapy.  DIAGNOSIS: Non-small cell carcinoma of lung, stage 4  Staging form: Lung, AJCC 7th Edition  Clinical stage from 09/06/2014: Stage IV (T2a, N2, M1b) - Signed by Curt Bears, MD on 09/06/2014  Staging comments: Adenocarcinoma  Positive for BRAF  PRIOR THERAPY: none  CURRENT THERAPY: Systemic chemotherapy with carboplatin for an AUC of 5 and Alimta at 400 mg/m given every 3 weeks. Status post 3 cycles.  Scheduled Meds: . antiseptic oral rinse  7 mL Mouth Rinse q12n4p  . calcium-vitamin D  1 tablet Oral q morning - 10a  . ceFEPime (MAXIPIME) IV  2 g  Intravenous QHS  . chlorhexidine  15 mL Mouth Rinse BID  . cilostazol  100 mg Oral BID AC  . cloNIDine  0.2 mg Oral BID  . fenofibrate  160 mg Oral Daily  . ferrous sulfate  325 mg Oral TID WC  . fluconazole (DIFLUCAN) IV  100 mg Intravenous Q24H  . folic acid  1 mg Oral Daily  . insulin aspart  0-15 Units Subcutaneous TID WC  . insulin glargine  18 Units Subcutaneous QHS  . ipratropium-albuterol  3 mL Nebulization Q4H  . magnesium sulfate 1 - 4 g bolus IVPB  2 g Intravenous Once  . pantoprazole  40 mg Oral Daily  . potassium chloride  10 mEq Intravenous Q1 Hr x 4  . potassium chloride  20 mEq Oral Once  . rosuvastatin  20 mg Oral Daily  . sodium chloride  1,000 mL Intravenous Q1H  . sodium chloride  3 mL Intravenous Q12H  . sucralfate  1 g Oral TID WC & HS  . vancomycin  750 mg Intravenous Q48H   Continuous Infusions: . sodium chloride 50 mL/hr at 11/06/14 0647   PRN Meds:acetaminophen **OR** acetaminophen, hydrALAZINE, HYDROcodone-homatropine, HYDROmorphone (DILAUDID) injection, hydrOXYzine   Objective:  Filed Vitals:   11/06/14 0751  BP:   Pulse:   Temp: 100.2 F (37.9 C)  Resp:       Intake/Output Summary (Last 24 hours) at 11/06/14 0859 Last data filed at 11/06/14 0751  Gross per 24 hour  Intake 3467.92 ml  Output    450 ml  Net 3017.92 ml    ECOG PERFORMANCE STATUS:3  GENERAL:alert, no distress and ill appearing SKIN: skin color,  texture, turgor are normal, no rashes or significant lesions EYES: normal, conjunctiva are pink and non-injected, sclera clear OROPHARYNX:no exudate, no erythema and lips, buccal mucosa, and tongue normal  NECK: supple, thyroid normal size, non-tender, without nodularity LYMPH:  no palpable lymphadenopathy in the cervical, axillary or inguinal LUNGS: bilateral wheezing, no rales or rhonchi HEART: regular rate & rhythm and no murmurs and no lower extremity edema ABDOMEN: soft, non-tender and normal bowel sounds Musculoskeletal:no  cyanosis of digits and no clubbing  PSYCH: alert & oriented, eyes closed but able to interact. NEURO: no focal motor/sensory deficits    CBG (last 3)   Recent Labs  11/06/14 0754  GLUCAP 279*     Labs:   Recent Labs Lab 11/01/14 1000 11/05/14 2010 11/06/14 0305  WBC 1.5* 0.2* 0.1*  HGB 7.1* 12.0 10.0*  HCT 21.1* 34.6* 29.1*  PLT 501* 29* 14*  MCV 78.7* 83.2 84.6  MCH 26.5 28.8 29.1  MCHC 33.6 34.7 34.4  RDW 13.1 14.0 14.1  LYMPHSABS 0.4* 0.2*  --   MONOABS 0.0* 0.0*  --   EOSABS 0.0 0.0  --   BASOSABS 0.0 0.0  --      Chemistries:    Recent Labs Lab 11/01/14 1001 11/05/14 2010 11/06/14 0305 11/06/14 0517  NA 136 135 133*  --   K 3.4* 3.4* 2.8*  --   CL  --  99* 106  --   CO2 19* 20* 17*  --   GLUCOSE 269* 555* 482*  --   BUN 24.8 47* 45*  --   CREATININE 1.9* 2.27* 2.16*  --   CALCIUM 8.8 8.7* 7.4*  --   MG  --   --   --  1.3*  AST 35* 26 35  --   ALT 31 26 24   --   ALKPHOS 105 116 111  --   BILITOT 0.39 1.1 1.1  --     GFR Estimated Creatinine Clearance: 17 mL/min (by C-G formula based on Cr of 2.16).  Liver Function Tests:  Recent Labs Lab 11/01/14 1001 11/05/14 2010 11/06/14 0305  AST 35* 26 35  ALT 31 26 24   ALKPHOS 105 116 111  BILITOT 0.39 1.1 1.1  PROT 7.5 7.6 6.5  ALBUMIN 2.7* 3.0* 2.6*    Recent Labs Lab 11/05/14 2010  LIPASE 26   No results for input(s): AMMONIA in the last 168 hours.  Urine Studies     Component Value Date/Time   COLORURINE YELLOW 11/05/2014 2003   APPEARANCEUR CLOUDY* 11/05/2014 2003   LABSPEC 1.017 11/05/2014 2003   PHURINE 5.0 11/05/2014 2003   GLUCOSEU >1000* 11/05/2014 2003   HGBUR SMALL* 11/05/2014 2003   BILIRUBINUR NEGATIVE 11/05/2014 2003   KETONESUR NEGATIVE 11/05/2014 2003   PROTEINUR 30* 11/05/2014 2003   UROBILINOGEN 0.2 11/05/2014 2003   NITRITE NEGATIVE 11/05/2014 2003   LEUKOCYTESUR NEGATIVE 11/05/2014 2003    Coagulation profile  Recent Labs Lab 11/06/14 0214  INR  1.13    CBG:  Recent Labs Lab 11/06/14 0754  GLUCAP 279*      Imaging Studies:  Ct Abdomen Pelvis Wo Contrast  11/06/2014   CLINICAL DATA:  Gradual persistent myalgias, nausea and vomiting.  EXAM: CT ABDOMEN AND PELVIS WITHOUT CONTRAST  TECHNIQUE: Multidetector CT imaging of the abdomen and pelvis was performed following the standard protocol without IV contrast.  COMPARISON:  CT 08/06/2014, CT 07/24/2014  FINDINGS: There is a large right pleural effusion, substantially increased from the prior imaging studies.  The left adrenal nodule has enlarged, measuring 10 x 16 mm and previously measuring 7 x 12 mm.  There is abnormal mural thickening of the duodenum and proximal jejunum. This is nonobstructive. Oral contrast has passed through to the terminal ileum. This may represent enteritis. There is no ascites. There is no extraluminal air.  There are unremarkable unenhanced appearances of the liver, spleen, pancreas, right adrenal and kidneys with the exception of a few nonobstructing collecting system calculi.  There is no adenopathy in the abdomen or pelvis. The abdominal aorta is normal in caliber.  There is prior cholecystectomy, appendectomy and hysterectomy.  The known skeletal lesions are not significantly changed. There is no interval change in the benign appearing moderate compression of T11.  IMPRESSION: *Marked mural thickening of the duodenum and proximal jejunum without obstruction. This may represent enteritis. *Enlargement of the left adrenal metastasis *Large right pleural effusion, significantly increased from 08/06/2014   Electronically Signed   By: Andreas Newport M.D.   On: 11/06/2014 00:55   Dg Chest Port 1 View  11/05/2014   CLINICAL DATA:  Fever and vomiting 48 hr  EXAM: PORTABLE CHEST - 1 VIEW  COMPARISON:  PET-CT scan 08/06/2014, radiograph 08/23/2014.  FINDINGS: Normal cardiac silhouette. There is a moderate right pleural effusions increased in volume compared to prior. This  effusion slightly partially loculated. There is central venous congestion. A perihilar opacity similar to comparison PET-CT scan.  IMPRESSION: Increasing right pleural effusion. Right perihilar opacity similar to prior PET-CT   Electronically Signed   By: Suzy Bouchard M.D.   On: 11/05/2014 22:12    Assessment/Plan: 74 y.o.   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 She is currently being treated with systemic chemotherapy in the form of carboplatin for an AUC of 5 and Alimta given every 3 weeks, last on 10/25/14 Prior to the start of cycle #4 with order restaging CT scan of her chest, abdomen and pelvis without contrast to re-evaluate her disease  Nausea and Vomiting Abdominal Pain CT abdomen and pelvis suspicious for possible enteritis in the setting of malignancy Appreciate supportive care given by primary team with antiemetics, IVF and close monitoring if her electrolytes  Anemia in neoplastic disease Due to recent chemotherapy malnutrition dilution She receive 2 units on 4/30 No transfusion is indicated at this time Monitor counts closely Transfuse blood to maintain a Hb of 8 g or if the patient is acutely bleeding  Thrombocytopenia This is due to malignancy, chemotherapy, dilution, infection Monitor counts closely No transfusion is indicated at this time Transfuse 1 unit of platelets if count is less or equal than 10,000 or 20,000 if the patient is acutely bleeding   Leukopenia Due to recent chemotherapy dilution  Will consider Granix as ANC is less than 0.1 Continue to closely monitor  Malnutrition Consider Nutrition evaluation  DVT  Prophylaxis On SCDs  Other medical issues as per admitting team     **Disclaimer: This note was dictated with voice recognition software. Similar sounding words can inadvertently be transcribed and this note may contain transcription errors which may not  have been corrected upon publication of note.Sharene Butters E, PA-C 11/06/2014  8:59 AM  ADDENDUM: Hematology/Oncology Attending: The patient is seen and examined. I agree with the above note. This is a very pleasant 74 years old white female with metastatic non-small cell lung cancer, adenocarcinoma currently undergoing systemic chemotherapy with carboplatin and  Alimta status post 3 cycles. The patient is tolerating her treatment fairly well but she presented to the emergency department last night complaining of fever and shortness of breath. CBC performed at the emergency Department showed significant neutropenia as well as thrombocytopenia. She has no evidence of anemia since the patient received 2 units of PRBCs transfusion recently. The patient was started on cefepime and vancomycin. She is feeling a little bit better today. For the chemotherapy-induced neutropenia, I will start the patient on Granix 300 g subcutaneously daily for the next 3 days or until her absolute neutrophil count is over 1000. For the chemotherapy-induced thrombocytopenia will consider the patient for platelet transfusion if her platelets count is less than 10,000 with no fever or 20,000 with fever or if the patient has any bleeding issues. Thank you for taking good care of Ms. Harding, I will continue to follow up the patient with you and assist in her management an as-needed basis.

## 2014-11-06 NOTE — Progress Notes (Signed)
Pt has reported difficulty swallowing. Especially pills. She states this has partially lead to her approximately 20 lb weight loss in the last few months. Will alert MD

## 2014-11-06 NOTE — Progress Notes (Addendum)
[facility] PROGRESS NOTE  Michaela Shaw SJG:[id number] DOB: 1941-05-18 DOA: 11/05/2014 PCP: [name], NP   Brief narrative 74 year old female with history of non-small cell lung cancer stage IV (diagnosed 2 months back and started on chemotherapy, last chemotherapy on 10/25/2014), type 2 diabetes mellitus, history of stroke, GERD, chronic kidney stage III (baseline creatinine around 1.2), hypertension, hyperlipidemia and peripheral artery disease who presented with nausea vomiting and abdominal pain since 3-4 days. Patient had generalized weakness since she started chemotherapy. However for the past few days she has nonbloody vomiting generalized weakness with poor appetite. She also reports dysphagia but no fever or chills. Patient also had diffuse abdominal pain. As outpatient she had received Neupogen 2 weeks back after chemotherapy and 2 units PRBC 3 days prior to admission. In the ED patient was found to be superior to septic with fever, tachycardia, pancytopenia (WBC 0.2, hemoglobin of 10 and platelets of 14). She also was hypokalemic with acute on chronic kidney disease and elevated lactic acid. Chest x-ray shows increasing right pleural effusion and unchanged right perihilar opacity. CT of her abdomen and pelvis done showed marked mural thickening of the duodenum and proximal jejunum without obstruction suggestive of enteritis. Also showed enlargement of the left adrenal metastases. Patient admitted to stepdown for close monitoring.    Assessment/Plan: Severe sepsis with neutropenic fever Possibly contributed by associated enteritis and neutropenia in the setting of recent chemotherapy. -Patient moved to stepdown unit for close monitoring. -Received IV normal saline bolus (4.5L since admission). BP stable and lactic acid normalized. -Continue empiric IV vancomycin and cefepime. Follow blood culture. Ordered IV fluconazole for dysphagia with concern for oropharyngeal thrush.   -Pain control with when necessary morphine. Supportive care with Tylenol and antiemetics.   Pancytopenia Secondary to recent chemotherapy. Appreciate oncology follow-up. No need for transfusion on this platelets less than 10,000 or less than 20,000 with active bleeding. Monitor ANC daily.  Acute on chronic kidney disease stage III Prerenal secondary to severe dehydration. CT abdomen and pelvis without signs of hydronephrosis. Monitor with IV fluids. Avoid nephrotoxins.  Non-small cell lung cancer stage IV Started on chemotherapy on 09/06/14 last chemotherapy on 10/25/2014. Follows with Dr. Julien Nordmann and is following pt in the hospital.  Hypokalemia and hypomagnesemia Repleted. Monitor QTc (was prolonged on admission to 539)  Type 2 diabetes mellitus with hyperglycemia Recent A1c of 7.3 and appears well controlled. He should not NPH at home. Started on Lantus 18 units at bedtime with sliding scale insulin. Elevated blood glucose this morning likely after receiving IV Solu-Medrol. Will monitor closely.  Dysphagia with mucositis Added IV fluconazole empirically and has a mouthwash with lidocaine  Essential hypertension Holding amlodipine. Continue clonidine.  Peripheral arterial disease Continue Pletal.  Dyslipidemia Continue fenofibrate and statin   Iron deficiency anemia Started on iron supplements  Protein calorie malnutrition Needs nutrition evaluation  DVT prophylaxis: SCD  Diet : clear liquid  Poor IV access: ordered PICC   Code Status:partial ( no INTUBATION) Family Communication: son at bedside Disposition Plan: continue stepdown monitoring.   Consultants:  oncology  Procedures:  CT abd and pelvis  Antibiotics:  IV vancomycin and cefepime since 5/2  HPI/Subjective: Patient seen and examined. Reports off-and-on abdominal pain but denies further nausea or vomiting. Reports dysphagia  Objective: Filed Vitals:   11/06/14 1300  BP: 159/85  Pulse: 97   Temp:   Resp: 28    Intake/Output Summary (Last 24 hours) at 11/06/14 1317 Last data filed at 11/06/14 1230  Gross per 24 hour  Intake 6075.84 ml  Output    500 ml  Net 5575.84 ml   Filed Weights   11/05/14 1928 11/06/14 0243 11/06/14 0930  Weight: 50.803 kg (112 lb) 52.9 kg (116 lb 10 oz) 53.8 kg (118 lb 9.7 oz)    Exam:   General:  Elderly female lying in bed in no acute distress but appears fatigued  HEENT: Pallor present, mucositis+, supple neck, no cervical lymphadenopathy  Cardiovascular: S1 and S2 tachycardic, no murmurs rub or gallop  Respiratory: Diminished breath sounds over right lung base, no wheeze, no crackles or rhonchi  Abdomen: Soft, diffuse mid abdomen tenderness, nondistended, bowel sounds present  Musculoskeletal: Warm, no edema, Port-A-Cath in place  CNS: Alert and oriented  Data Reviewed: Basic Metabolic Panel:  Recent Labs Lab 11/01/14 1001 11/05/14 2010 11/06/14 0305 11/06/14 0517  NA 136 135 133*  --   K 3.4* 3.4* 2.8*  --   CL  --  99* 106  --   CO2 19* 20* 17*  --   GLUCOSE 269* 555* 482*  --   BUN 24.8 47* 45*  --   CREATININE 1.9* 2.27* 2.16*  --   CALCIUM 8.8 8.7* 7.4*  --   MG  --   --   --  1.3*   Liver Function Tests:  Recent Labs Lab 11/01/14 1001 11/05/14 2010 11/06/14 0305  AST 35* 26 35  ALT '31 26 24  '$ ALKPHOS 105 116 111  BILITOT 0.39 1.1 1.1  PROT 7.5 7.6 6.5  ALBUMIN 2.7* 3.0* 2.6*    Recent Labs Lab 11/05/14 2010  LIPASE 26   No results for input(s): AMMONIA in the last 168 hours. CBC:  Recent Labs Lab 11/01/14 1000 11/05/14 2010 11/06/14 0305  WBC 1.5* 0.2* 0.1*  NEUTROABS 1.1* 0.0*  --   HGB 7.1* 12.0 10.0*  HCT 21.1* 34.6* 29.1*  MCV 78.7* 83.2 84.6  PLT 501* 29* 14*   Cardiac Enzymes: No results for input(s): CKTOTAL, CKMB, CKMBINDEX, TROPONINI in the last 168 hours. BNP (last 3 results) No results for input(s): BNP in the last 8760 hours.  ProBNP (last 3 results) No results for  input(s): PROBNP in the last 8760 hours.  CBG:  Recent Labs Lab 11/06/14 0754  GLUCAP 279*    Recent Results (from the past 240 hour(s))  MRSA PCR Screening     Status: None   Collection Time: 11/06/14  9:49 AM  Result Value Ref Range Status   MRSA by PCR NEGATIVE NEGATIVE Final    Comment:        The GeneXpert MRSA Assay (FDA approved for NASAL specimens only), is one component of a comprehensive MRSA colonization surveillance program. It is not intended to diagnose MRSA infection nor to guide or monitor treatment for MRSA infections.      Studies: Ct Abdomen Pelvis Wo Contrast  11/06/2014   CLINICAL DATA:  Gradual persistent myalgias, nausea and vomiting.  EXAM: CT ABDOMEN AND PELVIS WITHOUT CONTRAST  TECHNIQUE: Multidetector CT imaging of the abdomen and pelvis was performed following the standard protocol without IV contrast.  COMPARISON:  CT 08/06/2014, CT 07/24/2014  FINDINGS: There is a large right pleural effusion, substantially increased from the prior imaging studies.  The left adrenal nodule has enlarged, measuring 10 x 16 mm and previously measuring 7 x 12 mm.  There is abnormal mural thickening of the duodenum and proximal jejunum. This is nonobstructive. Oral contrast has passed through to the terminal  ileum. This may represent enteritis. There is no ascites. There is no extraluminal air.  There are unremarkable unenhanced appearances of the liver, spleen, pancreas, right adrenal and kidneys with the exception of a few nonobstructing collecting system calculi.  There is no adenopathy in the abdomen or pelvis. The abdominal aorta is normal in caliber.  There is prior cholecystectomy, appendectomy and hysterectomy.  The known skeletal lesions are not significantly changed. There is no interval change in the benign appearing moderate compression of T11.  IMPRESSION: *Marked mural thickening of the duodenum and proximal jejunum without obstruction. This may represent enteritis.  *Enlargement of the left adrenal metastasis *Large right pleural effusion, significantly increased from 08/06/2014   Electronically Signed   By: Andreas Newport M.D.   On: 11/06/2014 00:55   Dg Chest Port 1 View  11/05/2014   CLINICAL DATA:  Fever and vomiting 48 hr  EXAM: PORTABLE CHEST - 1 VIEW  COMPARISON:  PET-CT scan 08/06/2014, radiograph 08/23/2014.  FINDINGS: Normal cardiac silhouette. There is a moderate right pleural effusions increased in volume compared to prior. This effusion slightly partially loculated. There is central venous congestion. A perihilar opacity similar to comparison PET-CT scan.  IMPRESSION: Increasing right pleural effusion. Right perihilar opacity similar to prior PET-CT   Electronically Signed   By: Suzy Bouchard M.D.   On: 11/05/2014 22:12    Scheduled Meds: . antiseptic oral rinse  7 mL Mouth Rinse q12n4p  . calcium-vitamin D  1 tablet Oral q morning - 10a  . ceFEPime (MAXIPIME) IV  2 g Intravenous QHS  . chlorhexidine  15 mL Mouth Rinse BID  . cilostazol  100 mg Oral BID AC  . cloNIDine  0.2 mg Oral BID  . fenofibrate  160 mg Oral Daily  . ferrous sulfate  325 mg Oral TID WC  . fluconazole (DIFLUCAN) IV  100 mg Intravenous Q24H  . folic acid  1 mg Oral Daily  . insulin aspart  0-15 Units Subcutaneous TID WC  . insulin glargine  18 Units Subcutaneous QHS  . ipratropium-albuterol  3 mL Nebulization Q4H  . pantoprazole  40 mg Oral Daily  . potassium chloride  10 mEq Intravenous Q1 Hr x 4  . potassium chloride  20 mEq Oral Once  . rosuvastatin  20 mg Oral Daily  . sodium chloride  3 mL Intravenous Q12H  . sucralfate  1 g Oral TID WC & HS  . vancomycin  750 mg Intravenous Q48H   Continuous Infusions: . sodium chloride 50 mL/hr at 11/06/14 1130      Time spent: [place], [place]  [facility] Pager [phone]. If 7PM-7AM, please contact night-coverage at [email or web address], password [facility] 11/06/2014, 1:17 PM  LOS: 1 day

## 2014-11-06 NOTE — Progress Notes (Signed)
Rx Brief Antibiotic note:  Cefepime See previous note for full details  Assessement:  Zosyn d/c'd Cefepime ordered for febrile neutropenia  Plan:  Cefepime 2Gm IV q24h  F/u SCr/cultures as needed  Dorrene German 11/06/2014 2:26 AM

## 2014-11-06 NOTE — Progress Notes (Signed)
CRITICAL VALUE ALERT  Critical value received:  Aerobic bottle gram negative rods  Date of notification:  11/06/14  Time of notification:  1600  Critical value read back: yes   Nurse who received alert:  Geannie Risen, RN   MD notified (1st page):  Dhungel, MD  Time of first page:  [phone] (text paged)  MD notified (2nd page):  Time of second page:  Responding MD:    Time MD responded:

## 2014-11-06 NOTE — Progress Notes (Signed)
Peripherally Inserted Central Catheter/Midline Placement  The IV Nurse has discussed with the patient and/or persons authorized to consent for the patient, the purpose of this procedure and the potential benefits and risks involved with this procedure.  The benefits include less needle sticks, lab draws from the catheter and patient may be discharged home with the catheter.  Risks include, but not limited to, infection, bleeding, blood clot (thrombus formation), and puncture of an artery; nerve damage and irregular heat beat.  Alternatives to this procedure were also discussed.  PICC/Midline Placement Documentation  PICC / Midline Double Lumen 51/70/01 PICC Right Basilic 33 cm 0 cm (Active)      Margretta Sidle, RN  Lindley Magnus M 11/06/2014, 2:04 PM

## 2014-11-06 NOTE — Progress Notes (Signed)
CRITICAL VALUE ALERT  Critical value received:  WBC-0.1  Date of notification:  11/06/2014  Time of notification:  .0051  Critical value read back:Yes.    Nurse who received alert:  Donne Anon  MD notified (1st page): Blaine Hamper  Time of first page:  [phone]

## 2014-11-06 NOTE — Progress Notes (Signed)
Inpatient Diabetes Program Recommendations  AACE/ADA: New Consensus Statement on Inpatient Glycemic Control (2013)  Target Ranges:  Prepandial:   less than 140 mg/dL      Peak postprandial:   less than 180 mg/dL (1-2 hours)      Critically ill patients:  140 - 180 mg/dL     Results for Michaela Shaw, Michaela Shaw (MRN [id number]) as of 11/06/2014 09:37  Ref. Range 11/06/2014 07:54  Glucose-Capillary Latest Ref Range: 70-99 mg/dL 279 (H)     Admit with: N/V/Abd Pain- Neutropenic Fever with Sepsis  History: DM, HTN, CVA, CKD3, Lung Cancer Stage 4 (recent diagnosis)  Home DM Meds: NPH insulin- 20 units AM        NPH insulin- 10 units PM       Humalog 10-12 units tid with meals  Current DM Orders: Lantus 18 units QHS            Novolog Moderate SSI tid     **Note Lantus 18 units QHS started last PM.  **Patient transferred to ICU this AM.  **Note patient received 80 mg IV Solumedrol today at 7AM X one dose.    Will follow while inpatient. Wyn Quaker RN, MSN, CDE Diabetes Coordinator Inpatient Diabetes Program Team Pager: [phone] (8a-5p)

## 2014-11-06 NOTE — Progress Notes (Signed)
PT Cancellation Note  Patient Details Name: Michaela Shaw MRN: [id number] DOB: Mar 20, 1941   Cancelled Treatment:    Reason Eval/Treat Not Completed: Patient at procedure or test/unavailable (PICC line)   Asucena Galer,KATHrine E 11/06/2014, 2:01 PM Carmelia Bake, PT, DPT 11/06/2014 Pager: [phone]

## 2014-11-06 NOTE — Procedures (Signed)
CRITICAL VALUE ALERT  Critical value received:  Lactic Acid 3.4  Date of notification:  11/05/13  Time of notification:  .481   Critical value read back:Yes.    Nurse who received al/ert:  Donne Anon  MD notified (1st page): Blaine Hamper Time of first page:  4:22 AM MD notified (2nd page):

## 2014-11-06 NOTE — Telephone Encounter (Signed)
Call received from patient's family who want to notify Dr. Julien Nordmann of the following.  "Spoke with [facility] yesterday afternoon because mom was not doing well.  She has worsened and admitted at 4:00 am.  She now is being moved from the fourth floor to the second floor."   Will notify Dr. Julien Nordmann.

## 2014-11-06 NOTE — Progress Notes (Signed)
Initial Nutrition Assessment  DOCUMENTATION CODES:  Non-severe (moderate) malnutrition in context of chronic illness  INTERVENTION: Once diet advanced, add Magic cup TID with meals, each supplement provides 290 kcal and 9 grams of protein  RD will continue to monitor for diet advancement.     NUTRITION DIAGNOSIS:  Inadequate oral intake related to inability to eat as evidenced by NPO status.   GOAL:  Patient will meet greater than or equal to 90% of their needs  MONITOR:  PO intake, Diet advancement, Labs, Weight trends  REASON FOR ASSESSMENT:  Malnutrition Screening Tool    ASSESSMENT: 74 y.o. female with past medical history of hypertension, hyperlipidemia, diabetes mellitus, history of stroke, GERD, chronic kidney disease-stage III, non-small cell lung cancer-stage IV (diagnosed on March 3rd, with first Chemo was 09/13/14 and last chemo on 10/25/14), PAD, who presents with nausea, vomiting and abdominal pain.  - Spoke with pt's family. Pt has only been drinking root beer and water (10-12 oz/day) and no food for the past 1- 1/2 weeks. Prior to that pt was not eating or drinking "much of anything." Pt reports trouble swallowing due to pain/irritation.  - Pt on clear liquid diet. Has not had anything due to poor respiratory status.  - Usual body weight is 130 lbs.  - Labs and medications reviewed  CBGs 118-215  Height:  Ht Readings from Last 1 Encounters:  11/06/14 '4\' 11"'$  (1.499 m)    Weight:  Wt Readings from Last 1 Encounters:  11/06/14 118 lb 9.7 oz (53.8 kg)    Ideal Body Weight:  43.2 kg  Wt Readings from Last 10 Encounters:  11/06/14 118 lb 9.7 oz (53.8 kg)  10/25/14 116 lb 4.8 oz (52.753 kg)  10/18/14 116 lb (52.617 kg)  10/04/14 127 lb (57.607 kg)  10/01/14 123 lb 6.4 oz (55.974 kg)  09/19/14 131 lb (59.421 kg)  09/13/14 133 lb (60.328 kg)  09/10/14 132 lb 12.8 oz (60.238 kg)  09/06/14 130 lb 6.4 oz (59.149 kg)  08/23/14 130 lb (58.968 kg)     BMI:  Body mass index is 23.94 kg/(m^2).  Estimated Nutritional Needs:  Kcal:  1300-1500  Protein:  70-80 g  Fluid:  1.5 L/day  Skin:     Diet Order:  Diet clear liquid Room service appropriate?: Yes; Fluid consistency:: Thin  EDUCATION NEEDS:  Education needs addressed   Intake/Output Summary (Last 24 hours) at 11/06/14 1546 Last data filed at 11/06/14 1400  Gross per 24 hour  Intake 6175.84 ml  Output    500 ml  Net 5675.84 ml    Last BM:  Prior to admission  Laurette Schimke [place], [place], [place]

## 2014-11-06 NOTE — Care Management Note (Signed)
Case Management Note  Patient Details  Name: Michaela Shaw MRN: [id number] Date of Birth: [birth year]  Subjective/Objective:        Sepsis versus new m.i. Based on ekg changes versus dka            Action/Plan: Home when stable  Will follow for needs  Expected Discharge Date:  11/09/14               Expected Discharge Plan:  Home/Self Care  In-House Referral:  NA  Discharge planning Services  CM Consult  Post Acute Care Choice:  NA Choice offered to:  NA  DME Arranged:    DME Agency:     HH Arranged:    [place] Agency:     Status of Service:     Medicare Important Message Given:    Date Medicare IM Given:    Medicare IM give by:    Date Additional Medicare IM Given:    Additional Medicare Important Message give by:     If discussed at [place] of Stay Meetings, dates discussed:    Additional Comments:  Leeroy Cha, RN 11/06/2014, 10:25 AM

## 2014-11-06 NOTE — Progress Notes (Signed)
PHARMACIST - PHYSICIAN ORDER COMMUNICATION  CONCERNING: P&T Medication Policy on Herbal Medications  DESCRIPTION:  This patient's order for: co-enzyme Q-10   has been noted.  This product(s) is classified as an "herbal" or natural product. Due to a lack of definitive safety studies or FDA approval, nonstandard manufacturing practices, plus the potential risk of unknown drug-drug interactions while on inpatient medications, the Pharmacy and Therapeutics Committee does not permit the use of "herbal" or natural products of this type within [facility].   ACTION TAKEN: The pharmacy department is unable to verify this order at this time and your patient has been informed of this safety policy. Please reevaluate patient's clinical condition at discharge and address if the herbal or natural product(s) should be resumed at that time.  Dorrene German 11/06/2014 3:44 AM

## 2014-11-06 NOTE — Progress Notes (Signed)
Report called to [facility]  D Mateo Flow RN

## 2014-11-07 DIAGNOSIS — A4150 Gram-negative sepsis, unspecified: Secondary | ICD-10-CM

## 2014-11-07 LAB — CBC WITH DIFFERENTIAL/PLATELET
BASOS ABS: 0 10*3/uL (ref 0.0–0.1)
Basophils Relative: 0 % (ref 0–1)
EOS PCT: 0 % (ref 0–5)
Eosinophils Absolute: 0 10*3/uL (ref 0.0–0.7)
HCT: 24.1 % — ABNORMAL LOW (ref 36.0–46.0)
Hemoglobin: 8.3 g/dL — ABNORMAL LOW (ref 12.0–15.0)
LYMPHS ABS: 0.1 10*3/uL — AB (ref 0.7–4.0)
Lymphocytes Relative: 92 % — ABNORMAL HIGH (ref 12–46)
MCH: 28.6 pg (ref 26.0–34.0)
MCHC: 34.4 g/dL (ref 30.0–36.0)
MCV: 83.1 fL (ref 78.0–100.0)
MONO ABS: 0 10*3/uL — AB (ref 0.1–1.0)
Monocytes Relative: 8 % (ref 3–12)
NEUTROS ABS: 0 10*3/uL — AB (ref 1.7–7.7)
Neutrophils Relative %: 0 % — ABNORMAL LOW (ref 43–77)
RBC: 2.9 MIL/uL — ABNORMAL LOW (ref 3.87–5.11)
RDW: 14.1 % (ref 11.5–15.5)
WBC: 0.1 10*3/uL — CL (ref 4.0–10.5)

## 2014-11-07 LAB — BASIC METABOLIC PANEL
ANION GAP: 8 (ref 5–15)
BUN: 44 mg/dL — ABNORMAL HIGH (ref 6–20)
CALCIUM: 7.6 mg/dL — AB (ref 8.9–10.3)
CO2: 18 mmol/L — ABNORMAL LOW (ref 22–32)
CREATININE: 2.04 mg/dL — AB (ref 0.44–1.00)
Chloride: 114 mmol/L — ABNORMAL HIGH (ref 101–111)
GFR calc non Af Amer: 23 mL/min — ABNORMAL LOW (ref >60)
GFR, EST AFRICAN AMERICAN: 26 mL/min — AB (ref >60)
Glucose, Bld: 173 mg/dL — ABNORMAL HIGH (ref 70–99)
Potassium: 2.8 mmol/L — ABNORMAL LOW (ref 3.5–5.1)
Sodium: 140 mmol/L (ref 135–145)

## 2014-11-07 LAB — GLUCOSE, CAPILLARY
GLUCOSE-CAPILLARY: 64 mg/dL — AB (ref 70–99)
GLUCOSE-CAPILLARY: 73 mg/dL (ref 70–99)
Glucose-Capillary: 249 mg/dL — ABNORMAL HIGH (ref 70–99)
Glucose-Capillary: 198 mg/dL — ABNORMAL HIGH (ref 70–99)
Glucose-Capillary: 138 mg/dL — ABNORMAL HIGH (ref 70–99)
Glucose-Capillary: 77 mg/dL (ref 70–99)
Glucose-Capillary: 91 mg/dL (ref 70–99)

## 2014-11-07 LAB — MAGNESIUM: Magnesium: 1.8 mg/dL (ref 1.7–2.4)

## 2014-11-07 MED ORDER — INSULIN GLARGINE 100 UNIT/ML ~~LOC~~ SOLN
12.0000 [IU] | Freq: Every day | SUBCUTANEOUS | Status: DC
  Administered 2014-11-07: 12 [IU] via SUBCUTANEOUS
  Filled 2014-11-07 (×2): qty 0.12

## 2014-11-07 MED ORDER — POTASSIUM CHLORIDE 10 MEQ/100ML IV SOLN
10.0000 meq | INTRAVENOUS | Status: AC
  Administered 2014-11-07 (×4): 10 meq via INTRAVENOUS
  Filled 2014-11-07 (×3): qty 100

## 2014-11-07 MED ORDER — SODIUM CHLORIDE 0.9 % IV SOLN
Freq: Once | INTRAVENOUS | Status: AC
  Administered 2014-11-07: 08:00:00 via INTRAVENOUS

## 2014-11-07 MED ORDER — FUROSEMIDE 10 MG/ML IJ SOLN
40.0000 mg | Freq: Once | INTRAMUSCULAR | Status: AC
  Administered 2014-11-07: 40 mg via INTRAVENOUS
  Filled 2014-11-07: qty 4

## 2014-11-07 MED ORDER — SODIUM CHLORIDE 0.9 % IV SOLN: Freq: Once | INTRAVENOUS | Status: AC

## 2014-11-07 NOTE — Progress Notes (Signed)
[facility] PROGRESS NOTE  Michaela Shaw QPY:[id number] DOB: June 09, 1941 DOA: 11/05/2014 PCP: [name], NP   Brief narrative 74 year old female with history of non-small cell lung cancer stage IV (diagnosed 2 months back and started on chemotherapy, last chemotherapy on 10/25/2014), type 2 diabetes mellitus, history of stroke, GERD, chronic kidney stage III (baseline creatinine around 1.2), hypertension, hyperlipidemia and peripheral artery disease who presented with nausea vomiting and abdominal pain since 3-4 days. Patient had generalized weakness since she started chemotherapy. However for the past few days she has nonbloody vomiting generalized weakness with poor appetite. She also reports dysphagia but no fever or chills. Patient also had diffuse abdominal pain. As outpatient she had received Neupogen 2 weeks back after chemotherapy and 2 units PRBC 3 days prior to admission. In the ED patient was found to be superior to septic with fever, tachycardia, pancytopenia (WBC 0.2, hemoglobin of 10 and platelets of 14). She also was hypokalemic with acute on chronic kidney disease and elevated lactic acid. Chest x-ray shows increasing right pleural effusion and unchanged right perihilar opacity. CT of her abdomen and pelvis done showed marked mural thickening of the duodenum and proximal jejunum without obstruction suggestive of enteritis. Also showed enlargement of the left adrenal metastases. Patient admitted to stepdown for close monitoring.    Assessment/Plan: Severe sepsis with neutropenic fever Possibly contributed by associated enteritis and UTI with febrile neutropenia in the setting of recent chemotherapy. 1/2 blood culture on admission growing gram-negative rods. (Possibly urinary source) -Continue step down monitoring. Patient received several IV normal saline boluses on admission for sepsis. Subsequently was in some respiratory distress requiring aortic IV Lasix. -Continue  empiric IV vancomycin and cefepime. Follow blood culture and sensitivity.   IV fluconazole for dysphagia with concern for oropharyngeal thrush.  -Pain control with when necessary morphine. Supportive care with Tylenol and antiemetics.   Pancytopenia with severe thrombocytopenia Secondary to recent chemotherapy. Appreciate oncology follow-up. Ordered 2 units platelet transfusion as levels are less than 5 this morning. PRBC transfusion not required at this time. Monitor closely. ANC <100. Started on Granix  daily for 3 days on 5/3.  Acute on chronic kidney disease stage III Prerenal secondary to severe dehydration. CT abdomen and pelvis without signs of hydronephrosis. Monitor with IV hydration. Fluids being held due to volume overload. Avoid nephrotoxins.  Non-small cell lung cancer stage IV Started on chemotherapy on 09/06/14 last chemotherapy on 10/25/2014. Follows with Dr. Julien Nordmann and is following pt in the hospital.  Hypokalemia and hypomagnesemia Being repleted. Monitor QTc (was prolonged on admission to 539)  Type 2 diabetes mellitus with hyperglycemia Recent A1c of 7.3 and appears well controlled.on  NPH at home. Started on Lantus 18 units at bedtime with sliding scale insulin. FSG stable.   Dysphagia with mucositis Added IV fluconazole empirically and has a mouthwash with lidocaine. Symptoms better today. Swallow evaluation pending. Patient reports dysphagia for past 2 months.  Essential hypertension Holding amlodipine. Continue clonidine.  Fluid overload Has large right pleural effusion. Lane further IV fluids. Symptoms better with periodic IV Lasix (received total 100 mg yesterday). Ordered further 40 mg iv this am.  Peripheral arterial disease Hold Pletal and aspirin given severe thrombocytopenia  Dyslipidemia Continue fenofibrate and statin   Iron deficiency anemia Started on iron supplements  Protein calorie malnutrition Needs nutrition evaluation once able to take  by mouth  DVT prophylaxis: SCD  Diet : Nothing by mouth for now until swallow evaluation  Poor IV access: ordered PICC  Code Status:partial ( no INTUBATION) Family Communication: No one at bedside today Disposition Plan: continue stepdown monitoring.   Consultants:  oncology  Procedures:  CT abd and pelvis  Antibiotics:  IV vancomycin and cefepime since 5/2  HPI/Subjective: Patient seen and examined. Required a few doses of IV Lasix due to volume overload and respiratory distress. Still very fatigued. Reports mouth pain to be better.  Objective: Filed Vitals:   11/07/14 1240  BP:   Pulse: 84  Temp: 97.6 F (36.4 C)  Resp: 27    Intake/Output Summary (Last 24 hours) at 11/07/14 1302 Last data filed at 11/07/14 0900  Gross per 24 hour  Intake    490 ml  Output   1600 ml  Net  -1110 ml   Filed Weights   11/06/14 0243 11/06/14 0930 11/07/14 0600  Weight: 52.9 kg (116 lb 10 oz) 53.8 kg (118 lb 9.7 oz) 54.1 kg (119 lb 4.3 oz)    Exam:   General:  Elderly female lying in bed  appears fatigued and dyspneic on minimal exertion  HEENT: Pallor present, mucositis+, supple neck, no cervical lymphadenopathy  Cardiovascular: S1 and S2, no murmurs rub or gallop  Respiratory: Diminished breath sounds over right lung base, no wheeze, no crackles or rhonchi  Abdomen: Soft, minimal abdominal tenderness nondistended, bowel sounds present  Musculoskeletal: Warm, no edema, Port-A-Cath in place  CNS: Alert and oriented  Data Reviewed: Basic Metabolic Panel:  Recent Labs Lab 11/01/14 1001 11/05/14 2010 11/06/14 0305 11/06/14 0517 11/06/14 1447 11/07/14 0425 11/07/14 0500  NA 136 135 133*  --  138 140  --   K 3.4* 3.4* 2.8*  --  3.4* 2.8*  --   CL  --  99* 106  --  114* 114*  --   CO2 19* 20* 17*  --  16* 18*  --   GLUCOSE 269* 555* 482*  --  240* 173*  --   BUN 24.8 47* 45*  --  39* 44*  --   CREATININE 1.9* 2.27* 2.16*  --  1.79* 2.04*  --   CALCIUM 8.8  8.7* 7.4*  --  7.1* 7.6*  --   MG  --   --   --  1.3*  --   --  1.8   Liver Function Tests:  Recent Labs Lab 11/01/14 1001 11/05/14 2010 11/06/14 0305 11/06/14 1447  AST 35* 26 35 66*  ALT '31 26 24 '$ 44  ALKPHOS 105 116 111 149*  BILITOT 0.39 1.1 1.1 0.9  PROT 7.5 7.6 6.5 6.3*  ALBUMIN 2.7* 3.0* 2.6* 2.4*    Recent Labs Lab 11/05/14 2010  LIPASE 26   No results for input(s): AMMONIA in the last 168 hours. CBC:  Recent Labs Lab 11/01/14 1000 11/05/14 2010 11/06/14 0305 11/07/14 0425  WBC 1.5* 0.2* 0.1* 0.1*  NEUTROABS 1.1* 0.0*  --  0.0*  HGB 7.1* 12.0 10.0* 8.3*  HCT 21.1* 34.6* 29.1* 24.1*  MCV 78.7* 83.2 84.6 83.1  PLT 501* 29* 14* <5*   Cardiac Enzymes: No results for input(s): CKTOTAL, CKMB, CKMBINDEX, TROPONINI in the last 168 hours. BNP (last 3 results) No results for input(s): BNP in the last 8760 hours.  ProBNP (last 3 results) No results for input(s): PROBNP in the last 8760 hours.  CBG:  Recent Labs Lab 11/06/14 1312 11/06/14 1742 11/06/14 2135 11/06/14 2216 11/07/14 0800  GLUCAP 215* 249* 199* 198* 138*    Recent Results (from the past 240 hour(s))  Urine culture  Status: None (Preliminary result)   Collection Time: 11/05/14  8:03 PM  Result Value Ref Range Status   Specimen Description URINE, CLEAN CATCH  Final   Special Requests NONE  Final   Colony Count   Final    >=100,000 COLONIES/ML Performed at [facility]    Culture   Final    ESCHERICHIA COLI Performed at [facility]    Report Status PENDING  Incomplete  Blood culture (routine x 2)     Status: None (Preliminary result)   Collection Time: 11/05/14  9:46 PM  Result Value Ref Range Status   Specimen Description BLOOD LEFT ANTECUBITAL  Final   Special Requests BOTTLES DRAWN AEROBIC ONLY 3ML  Final   Culture   Final    GRAM NEGATIVE RODS Note: Gram Stain Report Called to,Read Back By and Verified With: JESSICA C 11/06/14 1535 BY SMITHERSJ Performed at  [facility]    Report Status PENDING  Incomplete  Blood culture (routine x 2)     Status: None (Preliminary result)   Collection Time: 11/06/14  3:05 AM  Result Value Ref Range Status   Specimen Description BLOOD RIGHT HAND  Final   Special Requests BOTTLES DRAWN AEROBIC ONLY 5CC  Final   Culture   Final           BLOOD CULTURE RECEIVED NO GROWTH TO DATE CULTURE WILL BE HELD FOR 5 DAYS BEFORE ISSUING A FINAL NEGATIVE REPORT Performed at [facility]    Report Status PENDING  Incomplete  MRSA PCR Screening     Status: None   Collection Time: 11/06/14  9:49 AM  Result Value Ref Range Status   MRSA by PCR NEGATIVE NEGATIVE Final    Comment:        The GeneXpert MRSA Assay (FDA approved for NASAL specimens only), is one component of a comprehensive MRSA colonization surveillance program. It is not intended to diagnose MRSA infection nor to guide or monitor treatment for MRSA infections.      Studies: Ct Abdomen Pelvis Wo Contrast  11/06/2014   CLINICAL DATA:  Gradual persistent myalgias, nausea and vomiting.  EXAM: CT ABDOMEN AND PELVIS WITHOUT CONTRAST  TECHNIQUE: Multidetector CT imaging of the abdomen and pelvis was performed following the standard protocol without IV contrast.  COMPARISON:  CT 08/06/2014, CT 07/24/2014  FINDINGS: There is a large right pleural effusion, substantially increased from the prior imaging studies.  The left adrenal nodule has enlarged, measuring 10 x 16 mm and previously measuring 7 x 12 mm.  There is abnormal mural thickening of the duodenum and proximal jejunum. This is nonobstructive. Oral contrast has passed through to the terminal ileum. This may represent enteritis. There is no ascites. There is no extraluminal air.  There are unremarkable unenhanced appearances of the liver, spleen, pancreas, right adrenal and kidneys with the exception of a few nonobstructing collecting system calculi.  There is no adenopathy in the abdomen or  pelvis. The abdominal aorta is normal in caliber.  There is prior cholecystectomy, appendectomy and hysterectomy.  The known skeletal lesions are not significantly changed. There is no interval change in the benign appearing moderate compression of T11.  IMPRESSION: *Marked mural thickening of the duodenum and proximal jejunum without obstruction. This may represent enteritis. *Enlargement of the left adrenal metastasis *Large right pleural effusion, significantly increased from 08/06/2014   Electronically Signed   By: Andreas Newport M.D.   On: 11/06/2014 00:55   Dg Chest Port 1  View  11/06/2014   CLINICAL DATA:  Fever and shortness of breath for 2 days. History of lung carcinoma  EXAM: PORTABLE CHEST - 1 VIEW  COMPARISON:  Nov 05, 2014 chest radiograph ; PET-CT August 06, 2014  FINDINGS: There is extensive airspace consolidation throughout the right mid lower lung zones. Elsewhere there is diffuse interstitial edema, increased from 1 day prior. Heart size and pulmonary vascularity within normal limits. There is now a central catheter with tip in the superior cava. No pneumothorax. There is atherosclerotic change in aorta.  IMPRESSION: Moderate interstitial edema, likely due to congestive heart failure given change from 1 day prior. Widespread consolidation throughout the right mid and lower lung zones is again noted. There is underlying tumor in this area which is difficult to discern from surrounding pneumonitis. Heart size within normal limits.   Electronically Signed   By: Lowella Grip III M.D.   On: 11/06/2014 18:32   Dg Chest Port 1 View  11/05/2014   CLINICAL DATA:  Fever and vomiting 48 hr  EXAM: PORTABLE CHEST - 1 VIEW  COMPARISON:  PET-CT scan 08/06/2014, radiograph 08/23/2014.  FINDINGS: Normal cardiac silhouette. There is a moderate right pleural effusions increased in volume compared to prior. This effusion slightly partially loculated. There is central venous congestion. A perihilar opacity  similar to comparison PET-CT scan.  IMPRESSION: Increasing right pleural effusion. Right perihilar opacity similar to prior PET-CT   Electronically Signed   By: Suzy Bouchard M.D.   On: 11/05/2014 22:12    Scheduled Meds: . antiseptic oral rinse  7 mL Mouth Rinse q12n4p  . calcium-vitamin D  1 tablet Oral q morning - 10a  . ceFEPime (MAXIPIME) IV  2 g Intravenous QHS  . chlorhexidine  15 mL Mouth Rinse BID  . cilostazol  100 mg Oral BID AC  . cloNIDine  0.2 mg Oral BID  . fenofibrate  160 mg Oral Daily  . ferrous sulfate  325 mg Oral TID WC  . fluconazole (DIFLUCAN) IV  100 mg Intravenous Q24H  . folic acid  1 mg Oral Daily  . insulin aspart  0-15 Units Subcutaneous TID WC  . insulin glargine  18 Units Subcutaneous QHS  . ipratropium-albuterol  3 mL Nebulization QID  . pantoprazole  40 mg Oral Daily  . potassium chloride  10 mEq Intravenous Q1 Hr x 4  . potassium chloride  20 mEq Oral Once  . rosuvastatin  20 mg Oral Daily  . sodium chloride  10-40 mL Intracatheter Q12H  . sodium chloride  3 mL Intravenous Q12H  . sucralfate  1 g Oral TID WC & HS  . Tbo-filgastrim (GRANIX) SQ  300 mcg Subcutaneous q morning - 10a  . vancomycin  750 mg Intravenous Q48H   Continuous Infusions:      Time spent: [place], [place]  [facility] Pager [phone]. If 7PM-7AM, please contact night-coverage at [email or web address], password [facility] 11/07/2014, 1:02 PM  LOS: 2 days

## 2014-11-07 NOTE — Evaluation (Signed)
Clinical/Bedside Swallow Evaluation Patient Details  Name: Michaela Shaw MRN: [id number] Date of Birth: [birth year]  Today's Date: 11/07/2014 Time: SLP Start Time (ACUTE ONLY): 6237 SLP Stop Time (ACUTE ONLY): 1000 SLP Time Calculation (min) (ACUTE ONLY): 34 min  Past Medical History:  Past Medical History  Diagnosis Date  . History of chicken pox   . Diabetes mellitus without complication   . Hyperlipidemia   . Hypertension   . 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 Seizures 2005  . Stroke 2011    "mild" per pt.   Marland Kitchen GERD (gastroesophageal reflux disease)   . Leg swelling     Left leg; cause is unknown; however pt stated it goes away at rest  . Impaired circulation of right leg     Takes Pletal  . CKD (chronic kidney disease)   . Lung cancer    Past Surgical History:  Past Surgical History  Procedure Laterality Date  . Bladder suspension  1980  . Abdominal hysterectomy  1980    partial  . Cholecystectomy  2007  . Appendectomy  2007  . Spine surgery  2012    Has rods and pins  . Cataract extraction, bilateral    . Colonoscopy    . Upper gi endoscopy    . Video bronchoscopy with endobronchial ultrasound N/A 08/23/2014    Procedure: VIDEO BRONCHOSCOPY WITH ENDOBRONCHIAL ULTRASOUND with Biopsy;  Surgeon: Rigoberto Noel, MD;  Location: [place];  Service: Thoracic;  Laterality: N/A;   HPI:  74 y.o. female with past medical history of hypertension, hyperlipidemia, diabetes mellitus, history of stroke (2011), GERD, chronic kidney disease-stage III, non-small cell lung cancer-stage IV (diagnosed on March 3rd, with first Chemo was 09/13/14 and last chemo on 10/25/14), PAD admitted with nausea, vomiting and abdominal pain. CXR 5/3 moderate interstitial edema, likely due to congestive heart failure. Widespread consolidation throughout the right mid and lower lung zones is again noted. There is underlying tumor in this area which is difficult to  discern from surrounding pneumonitis.     Assessment / Plan / Recommendation Clinical Impression  Pt without complaints of dysphagia since stroke in 2011. She reports wearing dentures until one week ago when weight loss is prohibiting adequate fit, therefore prolonged mastication and transit with solid. No signs of airway compromise or residue with any consistency. SLP reviewed diet textures with pt and items on menu with decision for regular texture diet and pt can choose softer textures from the menu (mashed potatoes, soups, yogurt etc). Lethargy from chemo can increased aspiration risk as can aspirating emesis. Educated pt to sit upright during and after meals. No follow up is needed at this time.    Aspiration Risk   (mild-moderate)    Diet Recommendation Thin (regular texture)   Medication Administration: Whole meds with liquid Compensations: Slow rate;Small sips/bites    Other  Recommendations Oral Care Recommendations: Oral care BID   Follow Up Recommendations       Frequency and Duration        Pertinent Vitals/Pain none         Swallow Study          Oral/Motor/Sensory Function Overall Oral Motor/Sensory Function: Appears within functional limits for tasks assessed   Ice Chips Ice chips: Not tested   Thin Liquid Thin Liquid: Within functional limits Presentation: Cup (pt does not like straw)    Nectar Thick Nectar Thick Liquid: Not tested   Honey Thick Honey Thick Liquid:  Not tested   Puree Puree: Within functional limits   Solid   GO    Solid: Impaired Oral Phase Impairments: Impaired mastication Oral Phase Functional Implications:  (prolonged mastication and transit) Pharyngeal Phase Impairments:  (none)       Trenton Verne, Orbie Pyo 11/07/2014,10:21 AM  Orbie Pyo Colvin Caroli.Ed [employer] [phone]

## 2014-11-07 NOTE — Progress Notes (Signed)
PT Cancellation Note  Patient Details Name: CEANA FIALA MRN: [id number] DOB: [birth year]   Cancelled Treatment:    Reason Eval/Treat Not Completed: Medical issues which prohibited therapy (platelets <5, per RN not started yet, will await transfusion)   Kebra Lowrimore,KATHrine E 11/07/2014, 10:21 AM Carmelia Bake, PT, DPT 11/07/2014 Pager: [phone]

## 2014-11-07 NOTE — Progress Notes (Signed)
CBG is 64. Pt is asymptomatic. Gave 15 gm of soda to drink. Will continue to monitor and recheck CBG in an hour.

## 2014-11-07 NOTE — Progress Notes (Signed)
OT Cancellation Note  Patient Details Name: Michaela Shaw MRN: [id number] DOB: 1941/07/03   Cancelled Treatment:    Reason Eval/Treat Not Completed: Medical issues which prohibited therapy  Medical issues which prohibited therapy (platelets <5, per RN not started yet, will await transfusion  Arjan Strohm, Thereasa Parkin 11/07/2014, 11:34 AM

## 2014-11-08 ENCOUNTER — Other Ambulatory Visit: Payer: Medicare Other

## 2014-11-08 ENCOUNTER — Other Ambulatory Visit: Payer: Self-pay

## 2014-11-08 ENCOUNTER — Inpatient Hospital Stay (HOSPITAL_COMMUNITY): Payer: Medicare Other

## 2014-11-08 DIAGNOSIS — R7881 Bacteremia: Secondary | ICD-10-CM

## 2014-11-08 DIAGNOSIS — B961 Klebsiella pneumoniae [K. pneumoniae] as the cause of diseases classified elsewhere: Secondary | ICD-10-CM

## 2014-11-08 LAB — URINE CULTURE: Colony Count: >=100000

## 2014-11-08 LAB — CBC WITH DIFFERENTIAL/PLATELET
BASOS ABS: 0 10*3/uL (ref 0.0–0.1)
Basophils Relative: 0 % (ref 0–1)
EOS ABS: 0 10*3/uL (ref 0.0–0.7)
Eosinophils Relative: 0 % (ref 0–5)
HCT: 21.7 % — ABNORMAL LOW (ref 36.0–46.0)
HEMOGLOBIN: 7.5 g/dL — AB (ref 12.0–15.0)
Lymphocytes Relative: 92 % — ABNORMAL HIGH (ref 12–46)
Lymphs Abs: 0.1 10*3/uL — ABNORMAL LOW (ref 0.7–4.0)
MCH: 28.6 pg (ref 26.0–34.0)
MCHC: 34.6 g/dL (ref 30.0–36.0)
MCV: 82.8 fL (ref 78.0–100.0)
MONO ABS: 0 10*3/uL — AB (ref 0.1–1.0)
MONOS PCT: 0 % — AB (ref 3–12)
Neutro Abs: 0 10*3/uL — ABNORMAL LOW (ref 1.7–7.7)
Neutrophils Relative %: 8 % — ABNORMAL LOW (ref 43–77)
Platelets: 37 10*3/uL — ABNORMAL LOW (ref 150–400)
RBC: 2.62 MIL/uL — ABNORMAL LOW (ref 3.87–5.11)
RDW: 14.3 % (ref 11.5–15.5)
WBC: 0.1 10*3/uL — CL (ref 4.0–10.5)

## 2014-11-08 LAB — BASIC METABOLIC PANEL
Anion gap: 10 (ref 5–15)
BUN: 52 mg/dL — ABNORMAL HIGH (ref 6–20)
CO2: 21 mmol/L — ABNORMAL LOW (ref 22–32)
Calcium: 8.2 mg/dL — ABNORMAL LOW (ref 8.9–10.3)
Chloride: 109 mmol/L (ref 101–111)
Creatinine, Ser: 2.35 mg/dL — ABNORMAL HIGH (ref 0.44–1.00)
GFR calc Af Amer: 22 mL/min — ABNORMAL LOW (ref >60)
GFR, EST NON AFRICAN AMERICAN: 19 mL/min — AB (ref >60)
GLUCOSE: 65 mg/dL — AB (ref 70–99)
POTASSIUM: 2.5 mmol/L — AB (ref 3.5–5.1)
Sodium: 140 mmol/L (ref 135–145)

## 2014-11-08 LAB — GLUCOSE, CAPILLARY
GLUCOSE-CAPILLARY: 37 mg/dL — AB (ref 70–99)
GLUCOSE-CAPILLARY: 87 mg/dL (ref 70–99)
GLUCOSE-CAPILLARY: 164 mg/dL — AB (ref 70–99)
GLUCOSE-CAPILLARY: 44 mg/dL — AB (ref 70–99)
Glucose-Capillary: 161 mg/dL — ABNORMAL HIGH (ref 70–99)
Glucose-Capillary: 100 mg/dL — ABNORMAL HIGH (ref 70–99)

## 2014-11-08 LAB — CULTURE, BLOOD (ROUTINE X 2)

## 2014-11-08 LAB — PREPARE PLATELET PHERESIS
Unit division: 0
Unit division: 0

## 2014-11-08 LAB — GLUCOSE, RANDOM: Glucose, Bld: 62 mg/dL — ABNORMAL LOW (ref 70–99)

## 2014-11-08 LAB — MAGNESIUM: MAGNESIUM: 1.7 mg/dL (ref 1.7–2.4)

## 2014-11-08 MED ORDER — PANTOPRAZOLE SODIUM 40 MG PO TBEC
40.0000 mg | DELAYED_RELEASE_TABLET | Freq: Every day | ORAL | Status: DC
  Administered 2014-11-08 – 2014-11-10 (×3): 40 mg via ORAL
  Filled 2014-11-08 (×3): qty 1

## 2014-11-08 MED ORDER — PROMETHAZINE HCL 25 MG/ML IJ SOLN: 25.0000 mg | Freq: Four times a day (QID) | INTRAMUSCULAR | Status: DC | PRN

## 2014-11-08 MED ORDER — DEXTROSE 50 % IV SOLN
INTRAVENOUS | Status: AC
  Administered 2014-11-08: 50 mL
  Filled 2014-11-08: qty 50

## 2014-11-08 MED ORDER — PROMETHAZINE HCL 25 MG/ML IJ SOLN
12.5000 mg | Freq: Four times a day (QID) | INTRAMUSCULAR | Status: DC | PRN
  Administered 2014-11-08: 12.5 mg via INTRAVENOUS
  Filled 2014-11-08: qty 1

## 2014-11-08 MED ORDER — PANTOPRAZOLE SODIUM 40 MG IV SOLR: 40.0000 mg | Freq: Two times a day (BID) | INTRAVENOUS | Status: DC

## 2014-11-08 MED ORDER — POTASSIUM CHLORIDE 10 MEQ/100ML IV SOLN
10.0000 meq | INTRAVENOUS | Status: AC
  Administered 2014-11-08 (×6): 10 meq via INTRAVENOUS
  Filled 2014-11-08 (×6): qty 100

## 2014-11-08 MED ORDER — POTASSIUM CHLORIDE CRYS ER 20 MEQ PO TBCR
40.0000 meq | EXTENDED_RELEASE_TABLET | ORAL | Status: AC
  Administered 2014-11-08: 40 meq via ORAL
  Filled 2014-11-08: qty 2

## 2014-11-08 MED ORDER — ALUM & MAG HYDROXIDE-SIMETH 200-200-20 MG/5ML PO SUSP
30.0000 mL | Freq: Once | ORAL | Status: AC
  Administered 2014-11-08: 30 mL via ORAL
  Filled 2014-11-08: qty 30

## 2014-11-08 MED ORDER — ONDANSETRON HCL 4 MG/2ML IJ SOLN
4.0000 mg | Freq: Four times a day (QID) | INTRAMUSCULAR | Status: DC | PRN
  Administered 2014-11-08 (×2): 4 mg via INTRAVENOUS
  Filled 2014-11-08 (×3): qty 2

## 2014-11-08 MED ORDER — INSULIN ASPART 100 UNIT/ML ~~LOC~~ SOLN
0.0000 [IU] | Freq: Three times a day (TID) | SUBCUTANEOUS | Status: DC
  Administered 2014-11-09: 1 [IU] via SUBCUTANEOUS
  Administered 2014-11-10 (×3): 2 [IU] via SUBCUTANEOUS
  Administered 2014-11-11 (×2): 1 [IU] via SUBCUTANEOUS
  Administered 2014-11-11: 2 [IU] via SUBCUTANEOUS
  Administered 2014-11-12: 1 [IU] via SUBCUTANEOUS

## 2014-11-08 NOTE — Progress Notes (Signed)
Nutrition Brief Note  Magic Cup ordered for pt on all meal trays. Will follow-up for intake.  Laurette Schimke [place], [place], [place]

## 2014-11-08 NOTE — Progress Notes (Signed)
Hypoglycemic Event  CBG: 37  Treatment: Dextrose 50% 25 gms  Symptoms: none  Follow-up CBG: Time: CBG Result:  Possible Reasons for Event: Patient not eating meals  Comments/MD notified:    Kendra Woolford, Cheree Ditto  Remember to initiate Hypoglycemia Order Set & complete

## 2014-11-08 NOTE — Progress Notes (Signed)
[facility] PROGRESS NOTE  JALEEN GRUPP XLK:[id number] DOB: 1941/04/18 DOA: 11/05/2014 PCP: [name], NP   Brief narrative 74 year old female with history of non-small cell lung cancer stage IV (diagnosed 2 months back and started on chemotherapy, last chemotherapy on 10/25/2014), type 2 diabetes mellitus, history of stroke, GERD, chronic kidney stage III (baseline creatinine around 1.2), hypertension, hyperlipidemia and peripheral artery disease who presented with nausea vomiting and abdominal pain since 3-4 days. Patient had generalized weakness since she started chemotherapy. However for the past few days she has nonbloody vomiting generalized weakness with poor appetite. She also reports dysphagia but no fever or chills. Patient also had diffuse abdominal pain. As outpatient she had received Neupogen 2 weeks back after chemotherapy and 2 units PRBC 3 days prior to admission. In the ED patient was found to be superior to septic with fever, tachycardia, pancytopenia (WBC 0.2, hemoglobin of 10 and platelets of 14). She also was hypokalemic with acute on chronic kidney disease and elevated lactic acid. Chest x-ray shows increasing right pleural effusion and unchanged right perihilar opacity. CT of her abdomen and pelvis done showed marked mural thickening of the duodenum and proximal jejunum without obstruction suggestive of enteritis. Also showed enlargement of the left adrenal metastases. Patient admitted to stepdown for close monitoring.    Assessment/Plan: Severe sepsis with neutropenic fever -Secondary to enteritis, Escherichia coli UTI and Klebsiella bacteremia. -Continue step down monitoring. Blood pressure stable and afebrile  but patient still tachypneic. -Narrow down antibiotics to IV cefepime based on sensitivity. -Continue IV fluconazole. -Pain control with when necessary morphine. Supportive care with Tylenol and antiemetics.   Klebsiella bacteremia Possible  source is enteritis/? PNA. Check 2-D echo to evaluate LV function and rule out any vegetations. Patient will need at least 2 weeks of antibiotics.  Pancytopenia with severe thrombocytopenia Secondary to recent chemotherapy. Appreciate oncology follow-up. Received 20 platelets on 5/4 . No indication for PRBC transfusion. ANC <100. Started on Granix  daily for 3 days on 5/3.  Acute on chronic kidney disease stage III Prerenal secondary to severe dehydration. Also received few doses of IV Lasix. CT abdomen and pelvis without signs of hydronephrosis. Off Fluids. Avoid nephrotoxins.  Non-small cell lung cancer stage IV Started on chemotherapy on 09/06/14 last chemotherapy on 10/25/2014. Follows with Dr. Julien Nordmann   Hypokalemia and hypomagnesemia Low potassium persists in being repleted aggressively. QTC normal. Repeat magnesium.  Type 2 diabetes mellitus with hypoglycemia Recent A1c of 7.3. Receive D50 this morning for hypoglycemia which is likely in the setting of poor by mouth intake. Will discontinue Lantus and place on sensitive sliding scale insulin.  Dysphagia with mucositis Added IV fluconazole empirically and has a mouthwash with lidocaine. Symptoms better  Seen by swallow evaluation and recommend Carb modified diet.  Essential hypertension Holding amlodipine. Continue clonidine.  Fluid overload Improved with few doses of IV Lasix on follow-up x-ray. Will Pl., Foley for close I/O monitoring.  Peripheral arterial disease Hold Pletal and aspirin given severe thrombocytopenia  Dyslipidemia Continue fenofibrate and statin   Iron deficiency anemia Started on iron supplements  Protein calorie malnutrition Nutrition consulted.  DVT prophylaxis: SCD  Diet :   Poor IV access: PICC line placed   Code Status:partial ( no INTUBATION) Family Communication: No one at bedside today  Disposition Plan: continue stepdown monitoring. Condition still  guarded   Consultants:  oncology  Procedures:  CT abd and pelvis  Antibiotics:  IV vancomycin and cefepime since 5/2  HPI/Subjective: Patient seen and  examined. Still fatigued and tachypneic. Afebrile. Urine culture growing Escherichia coli and blood culture 1/2 growing Klebsiella. Complains of epigastric discomfort and heartburn. Objective: Filed Vitals:   11/08/14 0800  BP: 121/76  Pulse: 100  Temp: 97.7 F (36.5 C)  Resp: 35    Intake/Output Summary (Last 24 hours) at 11/08/14 1203 Last data filed at 11/08/14 1002  Gross per 24 hour  Intake    987 ml  Output   1850 ml  Net   -863 ml   Filed Weights   11/06/14 0930 11/07/14 0600 11/08/14 0400  Weight: 53.8 kg (118 lb 9.7 oz) 54.1 kg (119 lb 4.3 oz) 53.8 kg (118 lb 9.7 oz)    Exam:   General:  Elderly female lying in bed continues to be fatigued and dyspneic on minimal exertion  HEENT: Pallor present, mucositis improved, supple neck, no cervical lymphadenopathy  Cardiovascular: S1 and S2, no murmurs rub or gallop  Respiratory: Improved aeration over bilateral lung bases, no wheeze, no crackles or rhonchi  Abdomen: Soft, minimal abdominal tenderness.,  nondistended, bowel sounds present  Musculoskeletal: Warm, no edema, Port-A-Cath in place  CNS: Alert and oriented  Data Reviewed: Basic Metabolic Panel:  Recent Labs Lab 11/05/14 2010 11/06/14 0305 11/06/14 0517 11/06/14 1447 11/07/14 0425 11/07/14 0500 11/08/14 0455 11/08/14 0502  NA 135 133*  --  138 140  --  140  --   K 3.4* 2.8*  --  3.4* 2.8*  --  2.5*  --   CL 99* 106  --  114* 114*  --  109  --   CO2 20* 17*  --  16* 18*  --  21*  --   GLUCOSE 555* 482*  --  240* 173*  --  62*  65*  --   BUN 47* 45*  --  39* 44*  --  52*  --   CREATININE 2.27* 2.16*  --  1.79* 2.04*  --  2.35*  --   CALCIUM 8.7* 7.4*  --  7.1* 7.6*  --  8.2*  --   MG  --   --  1.3*  --   --  1.8  --  1.7   Liver Function Tests:  Recent Labs Lab 11/05/14 2010  11/06/14 0305 11/06/14 1447  AST 26 35 66*  ALT 26 24 44  ALKPHOS 116 111 149*  BILITOT 1.1 1.1 0.9  PROT 7.6 6.5 6.3*  ALBUMIN 3.0* 2.6* 2.4*    Recent Labs Lab 11/05/14 2010  LIPASE 26   No results for input(s): AMMONIA in the last 168 hours. CBC:  Recent Labs Lab 11/05/14 2010 11/06/14 0305 11/07/14 0425 11/08/14 0455  WBC 0.2* 0.1* 0.1* 0.1*  NEUTROABS 0.0*  --  0.0* 0.0*  HGB 12.0 10.0* 8.3* 7.5*  HCT 34.6* 29.1* 24.1* 21.7*  MCV 83.2 84.6 83.1 82.8  PLT 29* 14* <5* 37*   Cardiac Enzymes: No results for input(s): CKTOTAL, CKMB, CKMBINDEX, TROPONINI in the last 168 hours. BNP (last 3 results) No results for input(s): BNP in the last 8760 hours.  ProBNP (last 3 results) No results for input(s): PROBNP in the last 8760 hours.  CBG:  Recent Labs Lab 11/07/14 1557 11/07/14 1716 11/07/14 2143 11/08/14 0743 11/08/14 0825  GLUCAP 64* 73 91 37* 161*    Recent Results (from the past 240 hour(s))  Urine culture     Status: None   Collection Time: 11/05/14  8:03 PM  Result Value Ref Range Status   Specimen  Description URINE, CLEAN CATCH  Final   Special Requests NONE  Final   Colony Count   Final    >=100,000 COLONIES/ML Performed at [facility]    Culture   Final    ESCHERICHIA COLI Performed at [facility]    Report Status 11/08/2014 FINAL  Final   Organism ID, Bacteria ESCHERICHIA COLI  Final      Susceptibility   Escherichia coli - MIC*    AMPICILLIN RESISTANT      CEFAZOLIN <=4 SENSITIVE Sensitive     CEFTRIAXONE <=1 SENSITIVE Sensitive     CIPROFLOXACIN <=0.25 SENSITIVE Sensitive     GENTAMICIN <=1 SENSITIVE Sensitive     LEVOFLOXACIN <=0.12 SENSITIVE Sensitive     NITROFURANTOIN <=16 SENSITIVE Sensitive     TOBRAMYCIN <=1 SENSITIVE Sensitive     TRIMETH/SULFA <=20 SENSITIVE Sensitive     PIP/TAZO <=4 SENSITIVE Sensitive     * ESCHERICHIA COLI  Blood culture (routine x 2)     Status: None   Collection Time: 11/05/14   9:46 PM  Result Value Ref Range Status   Specimen Description BLOOD LEFT ANTECUBITAL  Final   Special Requests BOTTLES DRAWN AEROBIC ONLY 3ML  Final   Culture   Final    KLEBSIELLA OXYTOCA Note: Gram Stain Report Called to,Read Back By and Verified With: JESSICA C 11/06/14 1535 BY SMITHERSJ Performed at [facility]    Report Status 11/08/2014 FINAL  Final   Organism ID, Bacteria KLEBSIELLA OXYTOCA  Final      Susceptibility   Klebsiella oxytoca - MIC*    AMPICILLIN RESISTANT      AMPICILLIN/SULBACTAM 4 SENSITIVE Sensitive     CEFAZOLIN 16 INTERMEDIATE Intermediate     CEFEPIME <=1 SENSITIVE Sensitive     CEFTAZIDIME <=1 SENSITIVE Sensitive     CEFTRIAXONE <=1 SENSITIVE Sensitive     CIPROFLOXACIN <=0.25 SENSITIVE Sensitive     GENTAMICIN <=1 SENSITIVE Sensitive     IMIPENEM <=0.25 SENSITIVE Sensitive     PIP/TAZO <=4 SENSITIVE Sensitive     TOBRAMYCIN <=1 SENSITIVE Sensitive     TRIMETH/SULFA <=20 SENSITIVE Sensitive     * KLEBSIELLA OXYTOCA  Blood culture (routine x 2)     Status: None (Preliminary result)   Collection Time: 11/06/14  3:05 AM  Result Value Ref Range Status   Specimen Description BLOOD RIGHT HAND  Final   Special Requests BOTTLES DRAWN AEROBIC ONLY 5CC  Final   Culture   Final           BLOOD CULTURE RECEIVED NO GROWTH TO DATE CULTURE WILL BE HELD FOR 5 DAYS BEFORE ISSUING A FINAL NEGATIVE REPORT Performed at [facility]    Report Status PENDING  Incomplete  MRSA PCR Screening     Status: None   Collection Time: 11/06/14  9:49 AM  Result Value Ref Range Status   MRSA by PCR NEGATIVE NEGATIVE Final    Comment:        The GeneXpert MRSA Assay (FDA approved for NASAL specimens only), is one component of a comprehensive MRSA colonization surveillance program. It is not intended to diagnose MRSA infection nor to guide or monitor treatment for MRSA infections.      Studies: Dg Chest Port 1 View  11/08/2014   CLINICAL DATA:  Shortness of  breath.  Fluid overload.  EXAM: PORTABLE CHEST - 1 VIEW  COMPARISON:  11/06/2014  FINDINGS: The central venous catheter is unchanged with tip near the cavoatrial junction/  high right atrium. Cardiomediastinal silhouette is unchanged, with persistent obscure aeration of the right heart border. Thoracic aortic calcification is noted. Moderate right pleural effusion does not appear significantly changed, with similar appearance of underlying right lung atelectasis/ consolidation in the setting of known right hilar and right lung tumor. Right hilar calcification is again seen. Interstitial pulmonary edema has largely resolved. No pneumothorax.  IMPRESSION: 1. Largely resolved interstitial edema. 2. Unchanged, moderate right pleural and right perihilar/right lower lung opacity.   Electronically Signed   By: Logan Bores   On: 11/08/2014 08:46   Dg Chest Port 1 View  11/06/2014   CLINICAL DATA:  Fever and shortness of breath for 2 days. History of lung carcinoma  EXAM: PORTABLE CHEST - 1 VIEW  COMPARISON:  Nov 05, 2014 chest radiograph ; PET-CT August 06, 2014  FINDINGS: There is extensive airspace consolidation throughout the right mid lower lung zones. Elsewhere there is diffuse interstitial edema, increased from 1 day prior. Heart size and pulmonary vascularity within normal limits. There is now a central catheter with tip in the superior cava. No pneumothorax. There is atherosclerotic change in aorta.  IMPRESSION: Moderate interstitial edema, likely due to congestive heart failure given change from 1 day prior. Widespread consolidation throughout the right mid and lower lung zones is again noted. There is underlying tumor in this area which is difficult to discern from surrounding pneumonitis. Heart size within normal limits.   Electronically Signed   By: Lowella Grip III M.D.   On: 11/06/2014 18:32    Scheduled Meds: . antiseptic oral rinse  7 mL Mouth Rinse q12n4p  . calcium-vitamin D  1 tablet Oral q  morning - 10a  . ceFEPime (MAXIPIME) IV  2 g Intravenous QHS  . chlorhexidine  15 mL Mouth Rinse BID  . cloNIDine  0.2 mg Oral BID  . fenofibrate  160 mg Oral Daily  . ferrous sulfate  325 mg Oral TID WC  . fluconazole (DIFLUCAN) IV  100 mg Intravenous Q24H  . folic acid  1 mg Oral Daily  . insulin aspart  0-9 Units Subcutaneous TID WC  . ipratropium-albuterol  3 mL Nebulization QID  . pantoprazole  40 mg Oral Daily  . potassium chloride  10 mEq Intravenous Q1 Hr x 6  . potassium chloride  20 mEq Oral Once  . potassium chloride  40 mEq Oral Q4H  . rosuvastatin  20 mg Oral Daily  . sodium chloride  10-40 mL Intracatheter Q12H  . sodium chloride  3 mL Intravenous Q12H  . sucralfate  1 g Oral TID WC & HS  . Tbo-filgastrim (GRANIX) SQ  300 mcg Subcutaneous q morning - 10a   Continuous Infusions:      Time spent: [place], [place]  [facility] Pager [phone]. If 7PM-7AM, please contact night-coverage at [email or web address], password [facility] 11/08/2014, 12:03 PM  LOS: 3 days

## 2014-11-08 NOTE — Progress Notes (Signed)
OT Cancellation Note  Patient Details Name: Michaela Shaw MRN: [id number] DOB: April 09, 1941   Cancelled Treatment:    Reason Eval/Treat Not Completed: Other (comment).  Pt does not feel well this pm and is not up for OT. Will check back tomorrow  Keishon Chavarin 11/08/2014, 2:55 PM  Lesle Chris, OTR/L [phone] 11/08/2014

## 2014-11-08 NOTE — Progress Notes (Signed)
PT Cancellation Note  Patient Details Name: Michaela Shaw MRN: [id number] DOB: Jun 22, 1941   Cancelled Treatment:    Reason Eval/Treat Not Completed: Attempted PT eval-pt declined to participate due to not feeling well. Will check back another day.    Weston Anna, MPT Pager: [phone]

## 2014-11-08 NOTE — Progress Notes (Signed)
Hypoglycemic Event  CBG: 44  Treatment: Dextrose 50% 25 gms  Symptoms: none  Follow-up CBG: Time: CBG Result:  Possible Reasons for Event: Patient not eating meals  Comments/MD notified: yes    Tyshauna Finkbiner, Cheree Ditto  Remember to initiate Hypoglycemia Order Set & complete

## 2014-11-08 NOTE — Progress Notes (Signed)
CRITICAL VALUE ALERT  Critical value received:  Potassium-2.5  Date of notification:  11/08/14  Time of notification:  0528  Critical value read back:Yes.    Nurse who received alert:  S.Young,RN  MD notified (1st page):  K. Schorr,NP  Time of first page:  0620  MD notified (2nd page):  Time of second page:  Responding MD:  Maryjane Hurter  Time MD responded:  [phone]

## 2014-11-09 ENCOUNTER — Inpatient Hospital Stay (HOSPITAL_COMMUNITY): Payer: Medicare Other

## 2014-11-09 DIAGNOSIS — I339 Acute and subacute endocarditis, unspecified: Secondary | ICD-10-CM

## 2014-11-09 DIAGNOSIS — E162 Hypoglycemia, unspecified: Secondary | ICD-10-CM

## 2014-11-09 LAB — CBC WITH DIFFERENTIAL/PLATELET
BASOS ABS: 0 10*3/uL (ref 0.0–0.1)
BASOS PCT: 0 % (ref 0–1)
EOS ABS: 0 10*3/uL (ref 0.0–0.7)
Eosinophils Relative: 0 % (ref 0–5)
HEMATOCRIT: 22.4 % — AB (ref 36.0–46.0)
Hemoglobin: 7.6 g/dL — ABNORMAL LOW (ref 12.0–15.0)
LYMPHS ABS: 0.2 10*3/uL — AB (ref 0.7–4.0)
Lymphocytes Relative: 94 % — ABNORMAL HIGH (ref 12–46)
MCH: 28.5 pg (ref 26.0–34.0)
MCHC: 33.9 g/dL (ref 30.0–36.0)
MCV: 83.9 fL (ref 78.0–100.0)
Monocytes Absolute: 0 10*3/uL — ABNORMAL LOW (ref 0.1–1.0)
Monocytes Relative: 0 % — ABNORMAL LOW (ref 3–12)
NEUTROS ABS: 0 10*3/uL — AB (ref 1.7–7.7)
Neutrophils Relative %: 6 % — ABNORMAL LOW (ref 43–77)
PLATELETS: 8 10*3/uL — AB (ref 150–400)
RBC: 2.67 MIL/uL — ABNORMAL LOW (ref 3.87–5.11)
RDW: 14.6 % (ref 11.5–15.5)
WBC: 0.2 10*3/uL — AB (ref 4.0–10.5)

## 2014-11-09 LAB — BASIC METABOLIC PANEL
Anion gap: 9 (ref 5–15)
BUN: 53 mg/dL — AB (ref 6–20)
CO2: 20 mmol/L — ABNORMAL LOW (ref 22–32)
Calcium: 7.9 mg/dL — ABNORMAL LOW (ref 8.9–10.3)
Chloride: 112 mmol/L — ABNORMAL HIGH (ref 101–111)
Creatinine, Ser: 2.49 mg/dL — ABNORMAL HIGH (ref 0.44–1.00)
GFR calc Af Amer: 21 mL/min — ABNORMAL LOW (ref >60)
GFR, EST NON AFRICAN AMERICAN: 18 mL/min — AB (ref >60)
GLUCOSE: 103 mg/dL — AB (ref 70–99)
POTASSIUM: 3.3 mmol/L — AB (ref 3.5–5.1)
Sodium: 141 mmol/L (ref 135–145)

## 2014-11-09 LAB — GLUCOSE, CAPILLARY
GLUCOSE-CAPILLARY: 96 mg/dL (ref 70–99)
GLUCOSE-CAPILLARY: 119 mg/dL — AB (ref 70–99)
GLUCOSE-CAPILLARY: 123 mg/dL — AB (ref 70–99)
GLUCOSE-CAPILLARY: 140 mg/dL — AB (ref 70–99)
Glucose-Capillary: 79 mg/dL (ref 70–99)

## 2014-11-09 MED ORDER — PERFLUTREN LIPID MICROSPHERE
1.0000 mL | INTRAVENOUS | Status: AC | PRN
  Administered 2014-11-09: 2 mL via INTRAVENOUS
  Filled 2014-11-09: qty 10

## 2014-11-09 MED ORDER — RANITIDINE HCL 150 MG/10ML PO SYRP
75.0000 mg | ORAL_SOLUTION | Freq: Once | ORAL | Status: DC
  Filled 2014-11-09: qty 10

## 2014-11-09 MED ORDER — TBO-FILGRASTIM 300 MCG/0.5ML ~~LOC~~ SOSY
300.0000 ug | PREFILLED_SYRINGE | Freq: Every day | SUBCUTANEOUS | Status: DC
  Administered 2014-11-09 – 2014-11-13 (×5): 300 ug via SUBCUTANEOUS
  Filled 2014-11-09 (×6): qty 0.5

## 2014-11-09 MED ORDER — IPRATROPIUM-ALBUTEROL 0.5-2.5 (3) MG/3ML IN SOLN
3.0000 mL | Freq: Three times a day (TID) | RESPIRATORY_TRACT | Status: DC
  Administered 2014-11-09 – 2014-11-13 (×13): 3 mL via RESPIRATORY_TRACT
  Filled 2014-11-09 (×13): qty 3

## 2014-11-09 MED ORDER — ENSURE ENLIVE PO LIQD
237.0000 mL | Freq: Two times a day (BID) | ORAL | Status: DC
  Administered 2014-11-10 (×2): 50 mL via ORAL
  Administered 2014-11-11: 237 mL via ORAL
  Administered 2014-11-12: 20 mL via ORAL
  Administered 2014-11-12: 100 mL via ORAL

## 2014-11-09 MED ORDER — DEXTROSE 5 % IV SOLN
1.0000 g | INTRAVENOUS | Status: DC
  Administered 2014-11-09 – 2014-11-13 (×5): 1 g via INTRAVENOUS
  Filled 2014-11-09 (×5): qty 10

## 2014-11-09 MED ORDER — PERFLUTREN LIPID MICROSPHERE
INTRAVENOUS | Status: AC
  Filled 2014-11-09: qty 10

## 2014-11-09 MED ORDER — DEXTROSE-NACL 5-0.9 % IV SOLN
INTRAVENOUS | Status: DC
  Administered 2014-11-09: 13:00:00 via INTRAVENOUS

## 2014-11-09 MED ORDER — FAMOTIDINE 40 MG/5ML PO SUSR
10.0000 mg | Freq: Once | ORAL | Status: DC
  Filled 2014-11-09: qty 2.5

## 2014-11-09 MED ORDER — SODIUM CHLORIDE 0.9 % IV SOLN
Freq: Once | INTRAVENOUS | Status: AC
  Administered 2014-11-09: 08:00:00 via INTRAVENOUS

## 2014-11-09 MED ORDER — MORPHINE SULFATE 2 MG/ML IJ SOLN
1.0000 mg | Freq: Once | INTRAMUSCULAR | Status: DC
  Filled 2014-11-09: qty 1

## 2014-11-09 MED ORDER — SODIUM CHLORIDE 0.9 % IV SOLN: Freq: Once | INTRAVENOUS | Status: DC

## 2014-11-09 MED ORDER — MORPHINE SULFATE 4 MG/ML IJ SOLN
4.0000 mg | Freq: Once | INTRAMUSCULAR | Status: AC
  Administered 2014-11-09: 4 mg via INTRAVENOUS
  Filled 2014-11-09: qty 1

## 2014-11-09 MED ORDER — POTASSIUM CHLORIDE CRYS ER 20 MEQ PO TBCR
40.0000 meq | EXTENDED_RELEASE_TABLET | Freq: Once | ORAL | Status: AC
  Administered 2014-11-09: 40 meq via ORAL
  Filled 2014-11-09: qty 2

## 2014-11-09 NOTE — Progress Notes (Signed)
ANTIBIOTIC CONSULT NOTE - Follow up  Pharmacy Consult for Cefepime Indication: sepsis, febrile neutropenia  Allergies  Allergen Reactions  . Metformin And Related Other (See Comments)    Kidney function    Patient Measurements: Height: '4\' 11"'$  (149.9 cm) Weight: 119 lb 14.9 oz (54.4 kg) IBW/kg (Calculated) : 43.2   Vital Signs: Temp: 98 F (36.7 C) (05/06 0400) Temp Source: Oral (05/06 0400) BP: 133/81 mmHg (05/06 0400) Pulse Rate: 108 (05/06 0400) Intake/Output from previous day: 05/05 0701 - 05/06 0700 In: 790 [I.V.:140; IV Piggyback:650] Out: 1300 [Urine:1300] Intake/Output from this shift:    Labs:  Recent Labs  11/07/14 0425 11/08/14 0455 11/09/14 0525  WBC 0.1* 0.1* 0.2*  HGB 8.3* 7.5* 7.6*  PLT <5* 37* 8*  CREATININE 2.04* 2.35* 2.49*   Estimated Creatinine Clearance: 14.9 mL/min (by C-G formula based on Cr of 2.49). No results for input(s): VANCOTROUGH, VANCOPEAK, VANCORANDOM, GENTTROUGH, GENTPEAK, GENTRANDOM, TOBRATROUGH, TOBRAPEAK, TOBRARND, AMIKACINPEAK, AMIKACINTROU, AMIKACIN in the last 72 hours.   Microbiology: Recent Results (from the past 720 hour(s))  Urine culture     Status: None   Collection Time: 11/05/14  8:03 PM  Result Value Ref Range Status   Specimen Description URINE, CLEAN CATCH  Final   Special Requests NONE  Final   Colony Count   Final    >=100,000 COLONIES/ML Performed at [facility]    Culture   Final    ESCHERICHIA COLI Performed at [facility]    Report Status 11/08/2014 FINAL  Final   Organism ID, Bacteria ESCHERICHIA COLI  Final      Susceptibility   Escherichia coli - MIC*    AMPICILLIN RESISTANT      CEFAZOLIN <=4 SENSITIVE Sensitive     CEFTRIAXONE <=1 SENSITIVE Sensitive     CIPROFLOXACIN <=0.25 SENSITIVE Sensitive     GENTAMICIN <=1 SENSITIVE Sensitive     LEVOFLOXACIN <=0.12 SENSITIVE Sensitive     NITROFURANTOIN <=16 SENSITIVE Sensitive     TOBRAMYCIN <=1 SENSITIVE Sensitive    TRIMETH/SULFA <=20 SENSITIVE Sensitive     PIP/TAZO <=4 SENSITIVE Sensitive     CEFEPIME <=1 SENSITIVE Sensitive     * ESCHERICHIA COLI  Blood culture (routine x 2)     Status: None   Collection Time: 11/05/14  9:46 PM  Result Value Ref Range Status   Specimen Description BLOOD LEFT ANTECUBITAL  Final   Special Requests BOTTLES DRAWN AEROBIC ONLY 3ML  Final   Culture   Final    KLEBSIELLA OXYTOCA Note: Gram Stain Report Called to,Read Back By and Verified With: JESSICA C 11/06/14 1535 BY SMITHERSJ Performed at [facility]    Report Status 11/08/2014 FINAL  Final   Organism ID, Bacteria KLEBSIELLA OXYTOCA  Final      Susceptibility   Klebsiella oxytoca - MIC*    AMPICILLIN RESISTANT      AMPICILLIN/SULBACTAM 4 SENSITIVE Sensitive     CEFAZOLIN 16 INTERMEDIATE Intermediate     CEFEPIME <=1 SENSITIVE Sensitive     CEFTAZIDIME <=1 SENSITIVE Sensitive     CEFTRIAXONE <=1 SENSITIVE Sensitive     CIPROFLOXACIN <=0.25 SENSITIVE Sensitive     GENTAMICIN <=1 SENSITIVE Sensitive     IMIPENEM <=0.25 SENSITIVE Sensitive     PIP/TAZO <=4 SENSITIVE Sensitive     TOBRAMYCIN <=1 SENSITIVE Sensitive     TRIMETH/SULFA <=20 SENSITIVE Sensitive     * KLEBSIELLA OXYTOCA  Blood culture (routine x 2)     Status: None (Preliminary result)  Collection Time: 11/06/14  3:05 AM  Result Value Ref Range Status   Specimen Description BLOOD RIGHT HAND  Final   Special Requests BOTTLES DRAWN AEROBIC ONLY 5CC  Final   Culture   Final           BLOOD CULTURE RECEIVED NO GROWTH TO DATE CULTURE WILL BE HELD FOR 5 DAYS BEFORE ISSUING A FINAL NEGATIVE REPORT Performed at [facility]    Report Status PENDING  Incomplete  MRSA PCR Screening     Status: None   Collection Time: 11/06/14  9:49 AM  Result Value Ref Range Status   MRSA by PCR NEGATIVE NEGATIVE Final    Comment:        The GeneXpert MRSA Assay (FDA approved for NASAL specimens only), is one component of a comprehensive MRSA  colonization surveillance program. It is not intended to diagnose MRSA infection nor to guide or monitor treatment for MRSA infections.    Assessment: 74 y.o. female with a hx of IDDM, HTN, CVA, Lung CA (Dx March 3rd with first Chemo was 09/13/14 and last chemo on 10/25/14) presents to the Emergency Department complaining of gradual, persistent, progressively worsening myalgias, fatigue, decreased onset after the first round of chemotherapy.  Pharmacy consulted to dose vancomycin and zosyn for sepsis. Zosyn switched to Cefepime 5/3. Vanc stopped 5/5.  Afebrile  ANC 0  SCr cont to rise, CrCl 15CG  5/2 blood: Klebsiella oxytoca (pan-sensitive) 5/3 blood: NGTD 5/3 urine: => 100,000 colonies/mL E.coli-pan-sensitive 5/3 MRSA PCR: negative  Goal of Therapy:  Appropriate antibiotic dosing for renal function; eradication of infection  Plan:  Cont Cefepime 2g IV q24h Fluconazole '100mg'$  IV q24h per MD Follow up renal fxn, culture results, and clinical course.  Romeo Rabon, PharmD, pager [phone]. 11/09/2014,7:13 AM.

## 2014-11-09 NOTE — Progress Notes (Signed)
Echocardiogram 2D Echocardiogram with Definity has been performed.  Tresa Res 11/09/2014, 1:16 PM

## 2014-11-09 NOTE — Progress Notes (Signed)
OT Cancellation Note  Patient Details Name: Michaela Shaw MRN: [id number] DOB: [birth year]   Cancelled Treatment:    Reason Eval/Treat Not Completed: Medical issues which prohibited therapy.  Noted platelets at 8.  Also WBC .2  Will check back Monday  Acy Orsak 11/09/2014, 7:57 AM  Lesle Chris, OTR/L [phone] 11/09/2014

## 2014-11-09 NOTE — Progress Notes (Signed)
Date:  Nov 09, 2014 U.R. performed for needs and level of care. Will continue to follow for Case Management needs.  Velva Harman, RN, BSN, [place]   [phone]

## 2014-11-09 NOTE — Progress Notes (Signed)
PT Cancellation Note  Patient Details Name: Michaela Shaw MRN: [id number] DOB: [birth year]   Cancelled Treatment:    Reason Eval/Treat Not Completed: Medical issues which prohibited therapy. Platelet levels have dropped to 8. Pt scheduled to receive transfusion. Will hold PT and check back as schedule permits-possibly another day. Thanks.    Weston Anna, MPT Pager: [phone]

## 2014-11-09 NOTE — Progress Notes (Signed)
CRITICAL VALUE ALERT  Critical value received:  Platelet count-8  Date of notification:  11/09/14  Time of notification:  0610  Critical value read back:Yes.    Nurse who received alert:  C. Denny,RN  MD notified (1st page):  K. Schorr,NP  Time of first page:  [phone]  MD notified (2nd page):  Time of second page:  Responding MD:  K. schorr,NP  Time MD responded:  [phone]

## 2014-11-09 NOTE — Progress Notes (Addendum)
[facility] PROGRESS NOTE  Michaela Shaw OEV:[id number] DOB: 02-17-1941 DOA: 11/05/2014 PCP: [name], NP   Brief narrative 74 year old female with history of non-small cell lung cancer stage IV (diagnosed 2 months back and started on chemotherapy, last chemotherapy on 10/25/2014), type 2 diabetes mellitus, history of stroke, GERD, chronic kidney stage III (baseline creatinine around 1.2), hypertension, hyperlipidemia and peripheral artery disease who presented with nausea vomiting and abdominal pain since 3-4 days. Patient had generalized weakness since she started chemotherapy. However for the past few days she has nonbloody vomiting generalized weakness with poor appetite. She also reports dysphagia but no fever or chills. Patient also had diffuse abdominal pain. As outpatient she had received Neupogen 2 weeks back after chemotherapy and 2 units PRBC 3 days prior to admission. In the ED patient was found to be superior to septic with fever, tachycardia, pancytopenia (WBC 0.2, hemoglobin of 10 and platelets of 14). She also was hypokalemic with acute on chronic kidney disease and elevated lactic acid. Chest x-ray shows increasing right pleural effusion and unchanged right perihilar opacity. CT of her abdomen and pelvis done showed marked mural thickening of the duodenum and proximal jejunum without obstruction suggestive of enteritis. Also showed enlargement of the left adrenal metastases. Patient admitted to stepdown for close monitoring.    Assessment/Plan: Principal problem Severe sepsis with neutropenic fever -Secondary to enteritis, Escherichia coli UTI and Klebsiella bacteremia. -Continue step down monitoring. Blood pressure stable and afebrile  but patient still very fatigued and tachypneic with minimal exertion. -Blood culture and urine culture mainly pansensitive. Will narrow down antibiotic further to IV Rocephin. -Continue IV fluconazole. -Pain control with when  necessary morphine. Supportive care with Tylenol and antiemetics.  Active problem Klebsiella bacteremia Possible source is enteritis/? PNA. Check 2-D echo to evaluate LV function and rule out any vegetations. Patient will need at least 2 weeks of antibiotics. Check repeat blood culture for clearing of bacteremia.  Pancytopenia with severe thrombocytopenia Secondary to recent chemotherapy. Appreciate oncology follow-up. Received 2 units platelets on 5/4 . Platelets again<10, ordered another 2 units. ANC <100. Received 3 days of granix until 5/5. Since [place] still low will resume Granix.   Acute on chronic kidney disease stage III Prerenal secondary to severe dehydration. Also received few doses of IV Lasix. CT abdomen and pelvis without signs of hydronephrosis. Off Fluids. Avoid nephrotoxins. Renal function worsening. Check renal ultrasound.  Abdominal distention with enteritis Check repeat abdominal x-ray. Continue Maalox and Protonix.  Non-small cell lung cancer stage IV Started on chemotherapy on 09/06/14 last chemotherapy on 10/25/2014. Follows with Dr. Julien Nordmann   Hypokalemia and hypomagnesemia Low potassium persists in being repleted aggressively. QTC normal. Repeat magnesium.  Type 2 diabetes mellitus with hypoglycemia Recent A1c of 7.3. Hypoglycemia due to poor by mouth intake. Will place on D5 NS   Dysphagia with mucositis Continue IV fluconazole and Magic mouthwash with lidocaine Seen by swallow evaluation and recommend Carb modified diet.  Essential hypertension Holding amlodipine. Continue clonidine.  Fluid overload Improved with few doses of IV Lasix. Follow-up x-ray shows resolution of pulmonary edema.. Continue Foley  Peripheral arterial disease Hold Pletal and aspirin given severe thrombocytopenia  Dyslipidemia Continue fenofibrate and statin   Iron deficiency anemia Started on iron supplements  Protein calorie malnutrition Nutrition  consulted.  Hypoglycemia   DVT prophylaxis: SCD  Diet :   Poor IV access: PICC line placed   Code Status:partial ( no INTUBATION) Family Communication: Spoke with family on 5/5 Disposition Plan: continue  stepdown monitoring. Condition still guarded   Consultants:  oncology  Procedures:  CT abd and pelvis  Antibiotics:  IV vancomycin and cefepime since 5/2-5/5  IV cefepime 5/5-5/6  IV Rocephin 5/6  HPI/Subjective: Still complains of some abdominal distention. Platelets low again. Feels a little better today but still tired. Objective: Filed Vitals:   11/09/14 1215  BP: 129/56  Pulse: 107  Temp: 98.3 F (36.8 C)  Resp: 27    Intake/Output Summary (Last 24 hours) at 11/09/14 1250 Last data filed at 11/09/14 1200  Gross per 24 hour  Intake    290 ml  Output   1575 ml  Net  -1285 ml   Filed Weights   11/07/14 0600 11/08/14 0400 11/09/14 0530  Weight: 54.1 kg (119 lb 4.3 oz) 53.8 kg (118 lb 9.7 oz) 54.4 kg (119 lb 14.9 oz)    Exam:   General:  Elderly female lying in bed continues to be fatigued and dyspneic on minimal exertion  HEENT: Pallor present, mucositis improved, supple neck,   Cardiovascular: S1 and S2, no murmurs rub or gallop  Respiratory: Improved aeration over bilateral lung bases, no wheeze, no crackles or rhonchi  Abdomen: Soft, epigastric distention , nontender bowel sounds present, Foley in place  Musculoskeletal: Warm, no edema, Port-A-Cath in place  CNS: Alert and oriented  Data Reviewed: Basic Metabolic Panel:  Recent Labs Lab 11/06/14 0305 11/06/14 0517 11/06/14 1447 11/07/14 0425 11/07/14 0500 11/08/14 0455 11/08/14 0502 11/09/14 0525  NA 133*  --  138 140  --  140  --  141  K 2.8*  --  3.4* 2.8*  --  2.5*  --  3.3*  CL 106  --  114* 114*  --  109  --  112*  CO2 17*  --  16* 18*  --  21*  --  20*  GLUCOSE 482*  --  240* 173*  --  62*  65*  --  103*  BUN 45*  --  39* 44*  --  52*  --  53*  CREATININE 2.16*   --  1.79* 2.04*  --  2.35*  --  2.49*  CALCIUM 7.4*  --  7.1* 7.6*  --  8.2*  --  7.9*  MG  --  1.3*  --   --  1.8  --  1.7  --    Liver Function Tests:  Recent Labs Lab 11/05/14 2010 11/06/14 0305 11/06/14 1447  AST 26 35 66*  ALT 26 24 44  ALKPHOS 116 111 149*  BILITOT 1.1 1.1 0.9  PROT 7.6 6.5 6.3*  ALBUMIN 3.0* 2.6* 2.4*    Recent Labs Lab 11/05/14 2010  LIPASE 26   No results for input(s): AMMONIA in the last 168 hours. CBC:  Recent Labs Lab 11/05/14 2010 11/06/14 0305 11/07/14 0425 11/08/14 0455 11/09/14 0525  WBC 0.2* 0.1* 0.1* 0.1* 0.2*  NEUTROABS 0.0*  --  0.0* 0.0* 0.0*  HGB 12.0 10.0* 8.3* 7.5* 7.6*  HCT 34.6* 29.1* 24.1* 21.7* 22.4*  MCV 83.2 84.6 83.1 82.8 83.9  PLT 29* 14* <5* 37* 8*   Cardiac Enzymes: No results for input(s): CKTOTAL, CKMB, CKMBINDEX, TROPONINI in the last 168 hours. BNP (last 3 results) No results for input(s): BNP in the last 8760 hours.  ProBNP (last 3 results) No results for input(s): PROBNP in the last 8760 hours.  CBG:  Recent Labs Lab 11/08/14 1643 11/08/14 1718 11/08/14 2104 11/09/14 0359 11/09/14 0747  GLUCAP 44* 164* 100* 96  79    Recent Results (from the past 240 hour(s))  Urine culture     Status: None   Collection Time: 11/05/14  8:03 PM  Result Value Ref Range Status   Specimen Description URINE, CLEAN CATCH  Final   Special Requests NONE  Final   Colony Count   Final    >=100,000 COLONIES/ML Performed at [facility]    Culture   Final    ESCHERICHIA COLI Performed at [facility]    Report Status 11/08/2014 FINAL  Final   Organism ID, Bacteria ESCHERICHIA COLI  Final      Susceptibility   Escherichia coli - MIC*    AMPICILLIN RESISTANT      CEFAZOLIN <=4 SENSITIVE Sensitive     CEFTRIAXONE <=1 SENSITIVE Sensitive     CIPROFLOXACIN <=0.25 SENSITIVE Sensitive     GENTAMICIN <=1 SENSITIVE Sensitive     LEVOFLOXACIN <=0.12 SENSITIVE Sensitive     NITROFURANTOIN <=16  SENSITIVE Sensitive     TOBRAMYCIN <=1 SENSITIVE Sensitive     TRIMETH/SULFA <=20 SENSITIVE Sensitive     PIP/TAZO <=4 SENSITIVE Sensitive     CEFEPIME <=1 SENSITIVE Sensitive     * ESCHERICHIA COLI  Blood culture (routine x 2)     Status: None   Collection Time: 11/05/14  9:46 PM  Result Value Ref Range Status   Specimen Description BLOOD LEFT ANTECUBITAL  Final   Special Requests BOTTLES DRAWN AEROBIC ONLY 3ML  Final   Culture   Final    KLEBSIELLA OXYTOCA Note: Gram Stain Report Called to,Read Back By and Verified With: JESSICA C 11/06/14 1535 BY SMITHERSJ Performed at [facility]    Report Status 11/08/2014 FINAL  Final   Organism ID, Bacteria KLEBSIELLA OXYTOCA  Final      Susceptibility   Klebsiella oxytoca - MIC*    AMPICILLIN RESISTANT      AMPICILLIN/SULBACTAM 4 SENSITIVE Sensitive     CEFAZOLIN 16 INTERMEDIATE Intermediate     CEFEPIME <=1 SENSITIVE Sensitive     CEFTAZIDIME <=1 SENSITIVE Sensitive     CEFTRIAXONE <=1 SENSITIVE Sensitive     CIPROFLOXACIN <=0.25 SENSITIVE Sensitive     GENTAMICIN <=1 SENSITIVE Sensitive     IMIPENEM <=0.25 SENSITIVE Sensitive     PIP/TAZO <=4 SENSITIVE Sensitive     TOBRAMYCIN <=1 SENSITIVE Sensitive     TRIMETH/SULFA <=20 SENSITIVE Sensitive     * KLEBSIELLA OXYTOCA  Blood culture (routine x 2)     Status: None (Preliminary result)   Collection Time: 11/06/14  3:05 AM  Result Value Ref Range Status   Specimen Description BLOOD RIGHT HAND  Final   Special Requests BOTTLES DRAWN AEROBIC ONLY 5CC  Final   Culture   Final           BLOOD CULTURE RECEIVED NO GROWTH TO DATE CULTURE WILL BE HELD FOR 5 DAYS BEFORE ISSUING A FINAL NEGATIVE REPORT Performed at [facility]    Report Status PENDING  Incomplete  MRSA PCR Screening     Status: None   Collection Time: 11/06/14  9:49 AM  Result Value Ref Range Status   MRSA by PCR NEGATIVE NEGATIVE Final    Comment:        The GeneXpert MRSA Assay (FDA approved for NASAL  specimens only), is one component of a comprehensive MRSA colonization surveillance program. It is not intended to diagnose MRSA infection nor to guide or monitor treatment for MRSA infections.  Studies: US Renal  11/09/2014   CLINICAL DATA:  Acute renal failure  EXAM: RENAL / URINARY TRACT ULTRASOUND COMPLETE  COMPARISON:  CT 11/06/2014  FINDINGS: Right Kidney:  Length: 9.3 cm. Increased cortical echogenicity. Small amount of perinephric fluid is stable. No hydronephrosis.  Left Kidney:  Length: 11.1 cm in length. No hydronephrosis or mass. Increased cortical echogenicity is noted.  Bladder:  Foley catheter decompresses the bladder.  IMPRESSION: There is increased cortical echogenicity compatible with medical renal parenchymal disease  Small amount of right perinephric fluid is stable.  Decompressed bladder.  No hydronephrosis.   Electronically Signed   By: Marybelle Killings M.D.   On: 11/09/2014 12:16   Dg Chest Port 1 View  11/08/2014   CLINICAL DATA:  Shortness of breath.  Fluid overload.  EXAM: PORTABLE CHEST - 1 VIEW  COMPARISON:  11/06/2014  FINDINGS: The central venous catheter is unchanged with tip near the cavoatrial junction/ high right atrium. Cardiomediastinal silhouette is unchanged, with persistent obscure aeration of the right heart border. Thoracic aortic calcification is noted. Moderate right pleural effusion does not appear significantly changed, with similar appearance of underlying right lung atelectasis/ consolidation in the setting of known right hilar and right lung tumor. Right hilar calcification is again seen. Interstitial pulmonary edema has largely resolved. No pneumothorax.  IMPRESSION: 1. Largely resolved interstitial edema. 2. Unchanged, moderate right pleural and right perihilar/right lower lung opacity.   Electronically Signed   By: Logan Bores   On: 11/08/2014 08:46   Dg Abd Portable 1v  11/09/2014   CLINICAL DATA:  74 year old female with a history of abdominal  distention.  EXAM: PORTABLE ABDOMEN - 1 VIEW  COMPARISON:  Chest x-ray 11/08/2014, 11/05/2014, CT abdomen 11/06/2014, PET-CT 08/06/2014  FINDINGS: Gas within stomach, small bowel, and colon. Enteric contrast present within the splenic flexure and rectum. Borderline dilated cecum measures 7.5 cm.  Surgical changes of the right upper quadrant compatible with cholecystectomy, left pelvic sidewall, and lumbar spine fixation.  Diffuse osteopenia.  No displaced fracture identified.  Extensive vascular calcifications.  IMPRESSION: Gas throughout GI system, with borderline dilated cecum. This could be seen in the setting of ileus. Recommend following with interval plain film until resolution. If there is concern for acute abnormality, CT may be indicated.  Signed,  Dulcy Fanny. Earleen Newport, DO  [facility]  [facility]   Electronically Signed   By: Corrie Mckusick D.O.   On: 11/09/2014 08:59    Scheduled Meds: . sodium chloride   Intravenous Once  . antiseptic oral rinse  7 mL Mouth Rinse q12n4p  . calcium-vitamin D  1 tablet Oral q morning - 10a  . cefTRIAXone (ROCEPHIN)  IV  1 g Intravenous Q24H  . chlorhexidine  15 mL Mouth Rinse BID  . cloNIDine  0.2 mg Oral BID  . fenofibrate  160 mg Oral Daily  . ferrous sulfate  325 mg Oral TID WC  . fluconazole (DIFLUCAN) IV  100 mg Intravenous Q24H  . folic acid  1 mg Oral Daily  . insulin aspart  0-9 Units Subcutaneous TID WC  . ipratropium-albuterol  3 mL Nebulization TID  . pantoprazole  40 mg Oral Daily  . potassium chloride  20 mEq Oral Once  . rosuvastatin  20 mg Oral Daily  . sodium chloride  10-40 mL Intracatheter Q12H  . sodium chloride  3 mL Intravenous Q12H  . sucralfate  1 g Oral TID WC & HS   Continuous Infusions:  Time spent: [place], [employer]  [facility] Pager [phone]. If 7PM-7AM, please contact night-coverage at [email or web address], password [facility] 11/09/2014, 12:50 PM  LOS: 4  days

## 2014-11-09 NOTE — Progress Notes (Addendum)
Nutrition Follow-up  DOCUMENTATION CODES:  Non-severe (moderate) malnutrition in context of chronic illness   Pt meets criteria for moderate MALNUTRITION in the context of chronic illness as evidenced by mild to moderate depletion of muscle mass and mild to moderate depletion of fat mass.  INTERVENTION: - Ensure Enlive po BID, each supplement provides 350 kcal and 20 grams of protein - Continue Magic cup TID with meals, each supplement provides 290 kcal and 9 grams of protein - RD will continue to monitor for po intake.   NUTRITION DIAGNOSIS:  Inadequate oral intake related to inability to eat as evidenced by NPO status.  ongoing  GOAL:  Patient will meet greater than or equal to 90% of their needs  Not met  MONITOR:  PO intake, Diet advancement, Labs, Weight trends  REASON FOR ASSESSMENT:  Malnutrition Screening Tool    ASSESSMENT: 74 y.o. female with past medical history of hypertension, hyperlipidemia, diabetes mellitus, history of stroke, GERD, chronic kidney disease-stage III, non-small cell lung cancer-stage IV (diagnosed on March 3rd, with first Chemo was 09/13/14 and last chemo on 10/25/14), PAD, who presents with nausea, vomiting and abdominal pain.  -Diet upgraded to regular diet.  -Pt complaining of a burning sensation in her chest/stomach. Said that it burns even with liquids.  -Per RN, pt has had very poor po. She ordered oatmeal this morning for breakfast, but was unable to eat any. She has only been able to drink sips of chocolate milk.  -Pt with several episodes of hypoglycemia. Most likely related to pt's poor po intake. Received D5.   Will continue Magic Cup supplements and add Ensure Enlive. RD will continue to monitor.   Height:  Ht Readings from Last 1 Encounters:  11/06/14 _0  (1.499 m)    Weight:  Wt Readings from Last 1 Encounters:  11/09/14 119 lb 14.9 oz (54.4 kg)    Ideal Body Weight:  43.2 kg  Wt Readings from Last 10 Encounters:   11/09/14 119 lb 14.9 oz (54.4 kg)  10/25/14 116 lb 4.8 oz (52.753 kg)  10/18/14 116 lb (52.617 kg)  10/04/14 127 lb (57.607 kg)  10/01/14 123 lb 6.4 oz (55.974 kg)  09/19/14 131 lb (59.421 kg)  09/13/14 133 lb (60.328 kg)  09/10/14 132 lb 12.8 oz (60.238 kg)  09/06/14 130 lb 6.4 oz (59.149 kg)  08/23/14 130 lb (58.968 kg)    BMI:  Body mass index is 24.21 kg/(m^2).  Estimated Nutritional Needs:  Kcal:  1300-1500  Protein:  70-80 g  Fluid:  1.5 L/day  Skin:  Reviewed, no issues  Diet Order:  Diet regular Room service appropriate?: Yes; Fluid consistency:: Thin  EDUCATION NEEDS:  Education needs addressed   Intake/Output Summary (Last 24 hours) at 11/09/14 1302 Last data filed at 11/09/14 1200  Gross per 24 hour  Intake    290 ml  Output   1575 ml  Net  -1285 ml    Last BM:  5/5  Laurette Schimke Miller, [place], [place]

## 2014-11-10 ENCOUNTER — Encounter (HOSPITAL_COMMUNITY): Payer: Self-pay | Admitting: Radiology

## 2014-11-10 ENCOUNTER — Inpatient Hospital Stay (HOSPITAL_COMMUNITY): Payer: Medicare Other

## 2014-11-10 DIAGNOSIS — D61810 Antineoplastic chemotherapy induced pancytopenia: Secondary | ICD-10-CM

## 2014-11-10 DIAGNOSIS — I429 Cardiomyopathy, unspecified: Secondary | ICD-10-CM

## 2014-11-10 LAB — PREPARE PLATELET PHERESIS
Unit division: 0
Unit division: 0

## 2014-11-10 LAB — CBC WITH DIFFERENTIAL/PLATELET
BASOS ABS: 0 10*3/uL (ref 0.0–0.1)
Basophils Relative: 0 % (ref 0–1)
EOS PCT: 0 % (ref 0–5)
Eosinophils Absolute: 0 10*3/uL (ref 0.0–0.7)
HCT: 19.1 % — ABNORMAL LOW (ref 36.0–46.0)
Hemoglobin: 6.5 g/dL — CL (ref 12.0–15.0)
LYMPHS PCT: 86 % — AB (ref 12–46)
Lymphs Abs: 0.1 10*3/uL — ABNORMAL LOW (ref 0.7–4.0)
MCH: 28.5 pg (ref 26.0–34.0)
MCHC: 34 g/dL (ref 30.0–36.0)
MCV: 83.8 fL (ref 78.0–100.0)
Monocytes Absolute: 0 10*3/uL — ABNORMAL LOW (ref 0.1–1.0)
Monocytes Relative: 0 % — ABNORMAL LOW (ref 3–12)
Neutro Abs: 0 10*3/uL — ABNORMAL LOW (ref 1.7–7.7)
Neutrophils Relative %: 14 % — ABNORMAL LOW (ref 43–77)
PLATELETS: 26 10*3/uL — AB (ref 150–400)
RBC: 2.28 MIL/uL — AB (ref 3.87–5.11)
RDW: 14.8 % (ref 11.5–15.5)
WBC: 0.1 10*3/uL — AB (ref 4.0–10.5)

## 2014-11-10 LAB — GLUCOSE, CAPILLARY
GLUCOSE-CAPILLARY: 154 mg/dL — AB (ref 70–99)
GLUCOSE-CAPILLARY: 131 mg/dL — AB (ref 70–99)
Glucose-Capillary: 181 mg/dL — ABNORMAL HIGH (ref 70–99)
Glucose-Capillary: 174 mg/dL — ABNORMAL HIGH (ref 70–99)

## 2014-11-10 LAB — TROPONIN I
Troponin I: 0.27 ng/mL — ABNORMAL HIGH (ref <0.031)
Troponin I: 0.25 ng/mL — ABNORMAL HIGH (ref <0.031)

## 2014-11-10 LAB — PREPARE RBC (CROSSMATCH)

## 2014-11-10 LAB — BASIC METABOLIC PANEL
Anion gap: 5 (ref 5–15)
BUN: 62 mg/dL — AB (ref 6–20)
CALCIUM: 7.6 mg/dL — AB (ref 8.9–10.3)
CO2: 19 mmol/L — ABNORMAL LOW (ref 22–32)
Chloride: 120 mmol/L — ABNORMAL HIGH (ref 101–111)
Creatinine, Ser: 2.71 mg/dL — ABNORMAL HIGH (ref 0.44–1.00)
GFR calc Af Amer: 19 mL/min — ABNORMAL LOW (ref >60)
GFR, EST NON AFRICAN AMERICAN: 16 mL/min — AB (ref >60)
Glucose, Bld: 196 mg/dL — ABNORMAL HIGH (ref 70–99)
POTASSIUM: 3.9 mmol/L (ref 3.5–5.1)
Sodium: 144 mmol/L (ref 135–145)

## 2014-11-10 LAB — BRAIN NATRIURETIC PEPTIDE: B NATRIURETIC PEPTIDE 5: 1614.4 pg/mL — AB (ref 0.0–100.0)

## 2014-11-10 MED ORDER — SODIUM CHLORIDE 0.9 % IV SOLN: Freq: Once | INTRAVENOUS | Status: DC

## 2014-11-10 MED ORDER — MORPHINE SULFATE 2 MG/ML IJ SOLN
1.0000 mg | INTRAMUSCULAR | Status: AC | PRN
  Administered 2014-11-10 – 2014-11-11 (×3): 1 mg via INTRAVENOUS
  Filled 2014-11-10 (×2): qty 1

## 2014-11-10 MED ORDER — DEXTROSE-NACL 5-0.9 % IV SOLN
INTRAVENOUS | Status: AC
  Administered 2014-11-11: 04:00:00 via INTRAVENOUS

## 2014-11-10 MED ORDER — METOPROLOL TARTRATE 25 MG PO TABS
25.0000 mg | ORAL_TABLET | Freq: Two times a day (BID) | ORAL | Status: DC
  Administered 2014-11-10 – 2014-11-13 (×7): 25 mg via ORAL
  Filled 2014-11-10 (×7): qty 1

## 2014-11-10 MED ORDER — IOHEXOL 300 MG/ML  SOLN
50.0000 mL | Freq: Once | INTRAMUSCULAR | Status: AC | PRN
  Administered 2014-11-10: 50 mL via ORAL

## 2014-11-10 MED ORDER — FUROSEMIDE 10 MG/ML IJ SOLN
40.0000 mg | Freq: Once | INTRAMUSCULAR | Status: AC
  Administered 2014-11-10: 40 mg via INTRAVENOUS
  Filled 2014-11-10: qty 4

## 2014-11-10 NOTE — Progress Notes (Signed)
[facility] PROGRESS NOTE  Michaela Shaw MWN:[id number] DOB: [birth year] DOA: 11/05/2014 PCP: [name], NP   Brief narrative 74 year old female with history of non-small cell lung cancer stage IV (diagnosed 2 months back and started on chemotherapy, last chemotherapy on 10/25/2014), type 2 diabetes mellitus, history of stroke, GERD, chronic kidney stage III (baseline creatinine around 1.2), hypertension, hyperlipidemia and peripheral artery disease who presented with nausea vomiting and abdominal pain since 3-4 days. Patient had generalized weakness since she started chemotherapy. However for the past few days she has nonbloody vomiting generalized weakness with poor appetite. She also reports dysphagia but no fever or chills. Patient also had diffuse abdominal pain. As outpatient she had received Neupogen 2 weeks back after chemotherapy and 2 units PRBC 3 days prior to admission. In the ED patient was found to be superior to septic with fever, tachycardia, pancytopenia (WBC 0.2, hemoglobin of 10 and platelets of 14). She also was hypokalemic with acute on chronic kidney disease and elevated lactic acid. Chest x-ray shows increasing right pleural effusion and unchanged right perihilar opacity. CT of her abdomen and pelvis done showed marked mural thickening of the duodenum and proximal jejunum without obstruction suggestive of enteritis. Also showed enlargement of the left adrenal metastases. Patient admitted to stepdown for close monitoring.    Assessment/Plan:  Principal problem Severe sepsis with neutropenic fever -Secondary to enteritis, Escherichia coli UTI and Klebsiella bacteremia. -condition is  guarded. Continue step down monitoring.  Patient extremely fatigued and tachypnic . BP and HR stable.  -Blood culture and urine culture mainly pansensitive. Narrowed  antibiotics to IV Rocephin. -Continue IV fluconazole. -Pain control with when necessary morphine. Supportive care  with Tylenol and antiemetics.  Active problem Klebsiella bacteremia Possible source is enteritis/? PNA.  2-D echo without  vegetations. Patient will need at least 2 weeks of antibiotics. Follow  repeat blood culture for clearing of bacteremia.  Pancytopenia with severe thrombocytopenia Secondary to recent chemotherapy. Appreciate oncology follow-up. Received 2 units platelets on 5/4 and on 5/6  ANC <100. Received 3 days of granix until 5/5. Since [place] still low will resume Granix. Hb dropped to 6.5 today. Ordered 2 U prbc. Will given 40 mg IV lasix post transfusion.  Acute  systolic CHF/?ischemic cardiomyopathy Echo shows EF of 20% with diffuse hypokinesis. EKG shows ST depressions seen in lateal leads. Placed on BB. Have held ASA due to low platelets. Cannot use ACEi due to poor renal function. Spoke with cardiology consult Dr Marlou Porch. Not much to offer from cardiac standpoint. She is not a candidate for stress test or cardiac cath at this time. Recommended to continue current management. Monitor I/O  Acute on chronic kidney disease stage III Prerenal secondary to severe dehydration vs ATN due to sepsis.  CT abdomen and pelvis without signs of hydronephrosis. Off Fluids. Avoid nephrotoxins. Renal function worsening. renal ultrasound shows medical renal ds.  Abdominal distention with enteritis Check repeat abdominal x-ray concerned for partial SBO. Still has upper abdominal distention with sluggish bowel sounds today. Will repeat CT abdomen.. Continue Maalox and Protonix.  Non-small cell lung cancer stage IV  - extensive hypermetabolic tumor involving rt lung, rt hilum and mediastinum with metastatic pulm nodule, metastasis to left adrenals and lumbar vertebrae, on PET scan. Started on chemotherapy on 09/06/14 last chemotherapy on 10/25/2014. Follows with Dr. Julien Nordmann   Hypokalemia and hypomagnesemia Follow labs this am and replenish  Type 2 diabetes mellitus with hypoglycemia Recent A1c of  7.3. Hypoglycemia due to poor by  mouth intake. Improved with D5 drip   Dysphagia with mucositis Continue IV fluconazole and Magic mouthwash with lidocaine Seen by swallow evaluation and recommend Carb modified diet.  Essential hypertension Holding amlodipine. Continue clonidine.    Peripheral arterial disease Hold Pletal and aspirin given severe thrombocytopenia  Dyslipidemia Continue fenofibrate and statin   Iron deficiency anemia Started on iron supplements  Protein calorie malnutrition Nutrition consulted.     DVT prophylaxis: SCD  Diet : NPO for CT abd  Poor IV access: PICC line placed   Code Status: DNR ( after discussion with pt  with dauguter on 5/7) Family Communication: Spoke with daughter [place] on the phone Disposition Plan: continue stepdown monitoring. Condition very  Guarded. If unimproved or worsen in next 24 hrs will get palliative care consult.   Consultants:  oncology  Procedures:  CT abd and pelvis  Antibiotics:  IV vancomycin and cefepime since 5/2-5/5  IV cefepime 5/5-5/6  IV Rocephin 5/6  HPI/Subjective: Still very weak and tired. Has very poor appetite. Still has some abd distention  Objective: Filed Vitals:   11/10/14 0400  BP: 129/60  Pulse: 94  Temp: 97.6 F (36.4 C)  Resp: 27    Intake/Output Summary (Last 24 hours) at 11/10/14 0818 Last data filed at 11/10/14 0700  Gross per 24 hour  Intake 1392.83 ml  Output   1225 ml  Net 167.83 ml   Filed Weights   11/08/14 0400 11/09/14 0530 11/10/14 0400  Weight: 53.8 kg (118 lb 9.7 oz) 54.4 kg (119 lb 14.9 oz) 54.2 kg (119 lb 7.8 oz)    Exam:   General:  Elderly female lying in bed continues to be fatigued and dyspneic on minimal exertion  HEENT: Pallor present,  dry mucosa, no thrush  supple neck,   Cardiovascular: S1 and S2, no murmurs rub or gallop  Respiratory: Improved aeration over bilateral lung bases, no wheeze, no crackles or rhonchi  Abdomen:  Soft, epigastric distention with minimal  tenderness, bowel sounds sluggish Foley in place  Musculoskeletal: Warm, no edema, Port-A-Cath in place, rt PICC  CNS: Alert and oriented  Data Reviewed: Basic Metabolic Panel:  Recent Labs Lab 11/06/14 0305 11/06/14 0517 11/06/14 1447 11/07/14 0425 11/07/14 0500 11/08/14 0455 11/08/14 0502 11/09/14 0525  NA 133*  --  138 140  --  140  --  141  K 2.8*  --  3.4* 2.8*  --  2.5*  --  3.3*  CL 106  --  114* 114*  --  109  --  112*  CO2 17*  --  16* 18*  --  21*  --  20*  GLUCOSE 482*  --  240* 173*  --  62*  65*  --  103*  BUN 45*  --  39* 44*  --  52*  --  53*  CREATININE 2.16*  --  1.79* 2.04*  --  2.35*  --  2.49*  CALCIUM 7.4*  --  7.1* 7.6*  --  8.2*  --  7.9*  MG  --  1.3*  --   --  1.8  --  1.7  --    Liver Function Tests:  Recent Labs Lab 11/05/14 2010 11/06/14 0305 11/06/14 1447  AST 26 35 66*  ALT 26 24 44  ALKPHOS 116 111 149*  BILITOT 1.1 1.1 0.9  PROT 7.6 6.5 6.3*  ALBUMIN 3.0* 2.6* 2.4*    Recent Labs Lab 11/05/14 2010  LIPASE 26   No results for input(s):  AMMONIA in the last 168 hours. CBC:  Recent Labs Lab 11/05/14 2010 11/06/14 0305 11/07/14 0425 11/08/14 0455 11/09/14 0525 11/10/14 0620  WBC 0.2* 0.1* 0.1* 0.1* 0.2* 0.1*  NEUTROABS 0.0*  --  0.0* 0.0* 0.0* 0.0*  HGB 12.0 10.0* 8.3* 7.5* 7.6* 6.5*  HCT 34.6* 29.1* 24.1* 21.7* 22.4* 19.1*  MCV 83.2 84.6 83.1 82.8 83.9 83.8  PLT 29* 14* <5* 37* 8* 26*   Cardiac Enzymes: No results for input(s): CKTOTAL, CKMB, CKMBINDEX, TROPONINI in the last 168 hours. BNP (last 3 results) No results for input(s): BNP in the last 8760 hours.  ProBNP (last 3 results) No results for input(s): PROBNP in the last 8760 hours.  CBG:  Recent Labs Lab 11/09/14 0359 11/09/14 0747 11/09/14 1314 11/09/14 1630 11/09/14 2208  GLUCAP 96 79 119* 123* 140*    Recent Results (from the past 240 hour(s))  Urine culture     Status: None   Collection Time:  11/05/14  8:03 PM  Result Value Ref Range Status   Specimen Description URINE, CLEAN CATCH  Final   Special Requests NONE  Final   Colony Count   Final    >=100,000 COLONIES/ML Performed at [facility]    Culture   Final    ESCHERICHIA COLI Performed at [facility]    Report Status 11/08/2014 FINAL  Final   Organism ID, Bacteria ESCHERICHIA COLI  Final      Susceptibility   Escherichia coli - MIC*    AMPICILLIN RESISTANT      CEFAZOLIN <=4 SENSITIVE Sensitive     CEFTRIAXONE <=1 SENSITIVE Sensitive     CIPROFLOXACIN <=0.25 SENSITIVE Sensitive     GENTAMICIN <=1 SENSITIVE Sensitive     LEVOFLOXACIN <=0.12 SENSITIVE Sensitive     NITROFURANTOIN <=16 SENSITIVE Sensitive     TOBRAMYCIN <=1 SENSITIVE Sensitive     TRIMETH/SULFA <=20 SENSITIVE Sensitive     PIP/TAZO <=4 SENSITIVE Sensitive     CEFEPIME <=1 SENSITIVE Sensitive     * ESCHERICHIA COLI  Blood culture (routine x 2)     Status: None   Collection Time: 11/05/14  9:46 PM  Result Value Ref Range Status   Specimen Description BLOOD LEFT ANTECUBITAL  Final   Special Requests BOTTLES DRAWN AEROBIC ONLY 3ML  Final   Culture   Final    KLEBSIELLA OXYTOCA Note: Gram Stain Report Called to,Read Back By and Verified With: JESSICA C 11/06/14 1535 BY SMITHERSJ Performed at [facility]    Report Status 11/08/2014 FINAL  Final   Organism ID, Bacteria KLEBSIELLA OXYTOCA  Final      Susceptibility   Klebsiella oxytoca - MIC*    AMPICILLIN RESISTANT      AMPICILLIN/SULBACTAM 4 SENSITIVE Sensitive     CEFAZOLIN 16 INTERMEDIATE Intermediate     CEFEPIME <=1 SENSITIVE Sensitive     CEFTAZIDIME <=1 SENSITIVE Sensitive     CEFTRIAXONE <=1 SENSITIVE Sensitive     CIPROFLOXACIN <=0.25 SENSITIVE Sensitive     GENTAMICIN <=1 SENSITIVE Sensitive     IMIPENEM <=0.25 SENSITIVE Sensitive     PIP/TAZO <=4 SENSITIVE Sensitive     TOBRAMYCIN <=1 SENSITIVE Sensitive     TRIMETH/SULFA <=20 SENSITIVE Sensitive     *  KLEBSIELLA OXYTOCA  Blood culture (routine x 2)     Status: None (Preliminary result)   Collection Time: 11/06/14  3:05 AM  Result Value Ref Range Status   Specimen Description BLOOD RIGHT HAND  Final   Special  Requests BOTTLES DRAWN AEROBIC ONLY 5CC  Final   Culture   Final           BLOOD CULTURE RECEIVED NO GROWTH TO DATE CULTURE WILL BE HELD FOR 5 DAYS BEFORE ISSUING A FINAL NEGATIVE REPORT Performed at [facility]    Report Status PENDING  Incomplete  MRSA PCR Screening     Status: None   Collection Time: 11/06/14  9:49 AM  Result Value Ref Range Status   MRSA by PCR NEGATIVE NEGATIVE Final    Comment:        The GeneXpert MRSA Assay (FDA approved for NASAL specimens only), is one component of a comprehensive MRSA colonization surveillance program. It is not intended to diagnose MRSA infection nor to guide or monitor treatment for MRSA infections.      Studies: US Renal  11/09/2014   CLINICAL DATA:  Acute renal failure  EXAM: RENAL / URINARY TRACT ULTRASOUND COMPLETE  COMPARISON:  CT 11/06/2014  FINDINGS: Right Kidney:  Length: 9.3 cm. Increased cortical echogenicity. Small amount of perinephric fluid is stable. No hydronephrosis.  Left Kidney:  Length: 11.1 cm in length. No hydronephrosis or mass. Increased cortical echogenicity is noted.  Bladder:  Foley catheter decompresses the bladder.  IMPRESSION: There is increased cortical echogenicity compatible with medical renal parenchymal disease  Small amount of right perinephric fluid is stable.  Decompressed bladder.  No hydronephrosis.   Electronically Signed   By: Marybelle Killings M.D.   On: 11/09/2014 12:16   Dg Chest Port 1 View  11/08/2014   CLINICAL DATA:  Shortness of breath.  Fluid overload.  EXAM: PORTABLE CHEST - 1 VIEW  COMPARISON:  11/06/2014  FINDINGS: The central venous catheter is unchanged with tip near the cavoatrial junction/ high right atrium. Cardiomediastinal silhouette is unchanged, with persistent  obscure aeration of the right heart border. Thoracic aortic calcification is noted. Moderate right pleural effusion does not appear significantly changed, with similar appearance of underlying right lung atelectasis/ consolidation in the setting of known right hilar and right lung tumor. Right hilar calcification is again seen. Interstitial pulmonary edema has largely resolved. No pneumothorax.  IMPRESSION: 1. Largely resolved interstitial edema. 2. Unchanged, moderate right pleural and right perihilar/right lower lung opacity.   Electronically Signed   By: Logan Bores   On: 11/08/2014 08:46   Dg Abd Portable 1v  11/09/2014   CLINICAL DATA:  74 year old female with a history of abdominal distention.  EXAM: PORTABLE ABDOMEN - 1 VIEW  COMPARISON:  Chest x-ray 11/08/2014, 11/05/2014, CT abdomen 11/06/2014, PET-CT 08/06/2014  FINDINGS: Gas within stomach, small bowel, and colon. Enteric contrast present within the splenic flexure and rectum. Borderline dilated cecum measures 7.5 cm.  Surgical changes of the right upper quadrant compatible with cholecystectomy, left pelvic sidewall, and lumbar spine fixation.  Diffuse osteopenia.  No displaced fracture identified.  Extensive vascular calcifications.  IMPRESSION: Gas throughout GI system, with borderline dilated cecum. This could be seen in the setting of ileus. Recommend following with interval plain film until resolution. If there is concern for acute abnormality, CT may be indicated.  Signed,  Dulcy Fanny. Earleen Newport, DO  [facility]  [facility]   Electronically Signed   By: Corrie Mckusick D.O.   On: 11/09/2014 08:59    Scheduled Meds: . sodium chloride   Intravenous Once  . sodium chloride   Intravenous Once  . antiseptic oral rinse  7 mL Mouth Rinse q12n4p  . calcium-vitamin D  1 tablet Oral q morning - 10a  . cefTRIAXone (ROCEPHIN)  IV  1 g Intravenous Q24H  . chlorhexidine  15 mL Mouth Rinse BID  . cloNIDine  0.2  mg Oral BID  . feeding supplement (ENSURE ENLIVE)  237 mL Oral BID BM  . fenofibrate  160 mg Oral Daily  . ferrous sulfate  325 mg Oral TID WC  . fluconazole (DIFLUCAN) IV  100 mg Intravenous Q24H  . folic acid  1 mg Oral Daily  . insulin aspart  0-9 Units Subcutaneous TID WC  . ipratropium-albuterol  3 mL Nebulization TID  . metoprolol tartrate  25 mg Oral BID  .  morphine injection  1 mg Intravenous Once  . pantoprazole  40 mg Oral Daily  . potassium chloride  20 mEq Oral Once  . ranitidine  75 mg Oral Once  . rosuvastatin  20 mg Oral Daily  . sodium chloride  10-40 mL Intracatheter Q12H  . sodium chloride  3 mL Intravenous Q12H  . Tbo-filgastrim (GRANIX) SQ  300 mcg Subcutaneous q1800   Continuous Infusions:      Time spent: [place], [place]  [facility] Pager [phone]. If 7PM-7AM, please contact night-coverage at [email or web address], password [facility] 11/10/2014, 8:18 AM  LOS: 5 days

## 2014-11-10 NOTE — Plan of Care (Signed)
Comments:

## 2014-11-10 NOTE — Plan of Care (Signed)
Problem: Phase I Progression Outcomes Goal: OOB as tolerated unless otherwise ordered Outcome: Not Progressing Pt. On bedrest today. Hgb 6.3, to receive 2 units of PRBC's today.  Pt increased WOB at rest.

## 2014-11-11 ENCOUNTER — Inpatient Hospital Stay (HOSPITAL_COMMUNITY): Payer: Medicare Other

## 2014-11-11 LAB — BASIC METABOLIC PANEL
Anion gap: 15 (ref 5–15)
BUN: 73 mg/dL — ABNORMAL HIGH (ref 6–20)
CALCIUM: 8.4 mg/dL — AB (ref 8.9–10.3)
CO2: 20 mmol/L — ABNORMAL LOW (ref 22–32)
Chloride: 116 mmol/L — ABNORMAL HIGH (ref 101–111)
Creatinine, Ser: 2.85 mg/dL — ABNORMAL HIGH (ref 0.44–1.00)
GFR calc non Af Amer: 15 mL/min — ABNORMAL LOW (ref >60)
GFR, EST AFRICAN AMERICAN: 18 mL/min — AB (ref >60)
Glucose, Bld: 139 mg/dL — ABNORMAL HIGH (ref 70–99)
POTASSIUM: 3.5 mmol/L (ref 3.5–5.1)
SODIUM: 151 mmol/L — AB (ref 135–145)

## 2014-11-11 LAB — GLUCOSE, CAPILLARY
GLUCOSE-CAPILLARY: 131 mg/dL — AB (ref 70–99)
GLUCOSE-CAPILLARY: 145 mg/dL — AB (ref 70–99)
Glucose-Capillary: 151 mg/dL — ABNORMAL HIGH (ref 70–99)
Glucose-Capillary: 120 mg/dL — ABNORMAL HIGH (ref 70–99)

## 2014-11-11 LAB — CBC WITH DIFFERENTIAL/PLATELET
BASOS PCT: 0 % (ref 0–1)
Basophils Absolute: 0 10*3/uL (ref 0.0–0.1)
EOS PCT: 0 % (ref 0–5)
Eosinophils Absolute: 0 10*3/uL (ref 0.0–0.7)
HEMATOCRIT: 29.8 % — AB (ref 36.0–46.0)
Hemoglobin: 10.2 g/dL — ABNORMAL LOW (ref 12.0–15.0)
LYMPHS ABS: 0.2 10*3/uL — AB (ref 0.7–4.0)
Lymphocytes Relative: 87 % — ABNORMAL HIGH (ref 12–46)
MCH: 28.5 pg (ref 26.0–34.0)
MCHC: 34.2 g/dL (ref 30.0–36.0)
MCV: 83.2 fL (ref 78.0–100.0)
MONO ABS: 0 10*3/uL — AB (ref 0.1–1.0)
Monocytes Relative: 13 % — ABNORMAL HIGH (ref 3–12)
Neutro Abs: 0 10*3/uL — ABNORMAL LOW (ref 1.7–7.7)
Neutrophils Relative %: 0 % — ABNORMAL LOW (ref 43–77)
PLATELETS: 13 10*3/uL — AB (ref 150–400)
RBC: 3.58 MIL/uL — ABNORMAL LOW (ref 3.87–5.11)
RDW: 15.1 % (ref 11.5–15.5)
WBC: 0.2 10*3/uL — CL (ref 4.0–10.5)

## 2014-11-11 LAB — TROPONIN I: Troponin I: 0.25 ng/mL — ABNORMAL HIGH (ref <0.031)

## 2014-11-11 MED ORDER — VITAMINS A & D EX OINT
TOPICAL_OINTMENT | CUTANEOUS | Status: AC
  Administered 2014-11-11: 5
  Filled 2014-11-11: qty 5

## 2014-11-11 MED ORDER — PANTOPRAZOLE SODIUM 40 MG PO TBEC
40.0000 mg | DELAYED_RELEASE_TABLET | Freq: Two times a day (BID) | ORAL | Status: DC
  Administered 2014-11-11 – 2014-11-13 (×5): 40 mg via ORAL
  Filled 2014-11-11 (×6): qty 1

## 2014-11-11 NOTE — Progress Notes (Signed)
iredness yesterday.[facility] PROGRESS NOTE  Michaela Shaw WLS:[id number] DOB: 02/14/1941 DOA: 11/05/2014 PCP: [name], NP   Brief narrative 74 year old female with history of non-small cell lung cancer stage IV (diagnosed 2 months back and started on chemotherapy, last chemotherapy on 10/25/2014), type 2 diabetes mellitus, history of stroke, GERD, chronic kidney stage III (baseline creatinine around 1.2), hypertension, hyperlipidemia and peripheral artery disease who presented with nausea vomiting and abdominal pain since 3-4 days. Patient had generalized weakness since she started chemotherapy. However for the past few days she has nonbloody vomiting generalized weakness with poor appetite. She also reports dysphagia but no fever or chills. Patient also had diffuse abdominal pain. As outpatient she had received Neupogen 2 weeks back after chemotherapy and 2 units PRBC 3 days prior to admission. In the ED patient was found to be superior to septic with fever, tachycardia, pancytopenia (WBC 0.2, hemoglobin of 10 and platelets of 14). She also was hypokalemic with acute on chronic kidney disease and elevated lactic acid. Chest x-ray shows increasing right pleural effusion and unchanged right perihilar opacity. CT of her abdomen and pelvis done showed marked mural thickening of the duodenum and proximal jejunum without obstruction suggestive of enteritis. Also showed enlargement of the left adrenal metastases. Patient admitted to stepdown for close monitoring.    Assessment/Plan:  Principal problem Severe sepsis with neutropenic fever -Secondary to enteritis, Escherichia coli UTI and Klebsiella bacteremia. -condition is  guarded. Continue step down monitoring.  Patient extremely fatigued with minimal effort. . BP and HR stable.  -Blood culture and urine culture mainly pansensitive. Narrowed  antibiotics to IV Rocephin. -Continue IV fluconazole. -Pain control with when necessary  morphine. Supportive care with Tylenol and antiemetics.  Active problem Klebsiella bacteremia Possible source is enteritis/? PNA.  2-D echo without  vegetations. Patient will need at least 2 weeks of antibiotics.  repeat blood culture negative.  Pancytopenia with severe thrombocytopenia Secondary to recent chemotherapy. Appreciate oncology follow-up. Received 2 units platelets on 5/4 and on 5/6  ANC still <100. Receiving Granix since 5/3. Hb improved after 2 units PRBC on 5/7.Marland Kitchen  Monitor closely.  Acute  systolic CHF/?ischemic cardiomyopathy Echo shows EF of 20% with diffuse hypokinesis. EKG shows ST depressions seen in lateal leads with mild elevated troponin.. Placed on BB. Have held ASA due to low platelets. Cannot use ACEi due to poor renal function. Spoke with cardiology consult Dr Marlou Porch on 5/7.Marland Kitchen Not much to offer from cardiac standpoint. She is not a candidate for stress test or cardiac cath at this time. Recommended to continue current management. Has good urine output and clinically feels better today.  Acute on chronic kidney disease stage III Prerenal secondary to severe dehydration vs ATN due to sepsis.  Also receiving periodic IV Lasix. CT abdomen and pelvis without signs of hydronephrosis. Off Fluids. Avoid nephrotoxins. Renal function worsening. renal ultrasound shows medical renal ds. -Renal function continues to worsen. Has good urine output. Continue to monitor.  Abdominal distention with enteritis Check repeat abdominal x-ray concerned for partial SBO. Still has upper abdominal distention with sluggish bowel sounds today. Will repeat CT abdomen.. Continue Maalox and Protonix.  Non-small cell lung cancer stage IV  - extensive hypermetabolic tumor involving rt lung, rt hilum and mediastinum with metastatic pulm nodule, metastasis to left adrenals and lumbar vertebrae, on PET scan. Started on chemotherapy on 09/06/14 last chemotherapy on 10/25/2014. Follows with Dr. Julien Nordmann    Hypokalemia and hypomagnesemia Improved. Replenish as needed.  Hypernatremia Will order free  water.   Type 2 diabetes mellitus with hypoglycemia Recent A1c of 7.3. Hypoglycemia due to poor by mouth intake. Improved with D5 drip.    Dysphagia with mucositis Continue IV fluconazole and Magic mouthwash with lidocaine Seen by swallow evaluation and recommend Carb modified diet. Encouraged by mouth intake.   GERD CT abdomen shows possible peptic ulcer disease. Will increase PPI to twice a day. Abdominal pain better today.  Essential hypertension Holding amlodipine. Continue clonidine.    Peripheral arterial disease Hold Pletal and aspirin given severe thrombocytopenia  Dyslipidemia Continue fenofibrate and statin   Iron deficiency anemia Started on iron supplements  Protein calorie malnutrition Nutrition consulted.  generalized weakness  PT eval pending    DVT prophylaxis: SCD  Diet : Regular.  Poor IV access: PICC line    Code Status: DNR ( after discussion with pt  with dauguter on 5/7) Family Communication: Spoke with daughter [place] on the phone Disposition Plan: continue stepdown monitoring. Condition very  Guarded although somewhat improved from yesterday. Will hold off on palliative care consults. Will discuss with Dr. Julien Nordmann tomorrow.  Consultants:  oncology  Procedures:  CT abd and pelvis  Antibiotics:  IV vancomycin and cefepime since 5/2-5/5  IV cefepime 5/5-5/6  IV Rocephin 5/6  HPI/Subjective: Reports feeling better today. Abdominal pain has improved and does not feel as much weak and tired as yesterday.  Objective: Filed Vitals:   11/11/14 0800  BP:   Pulse:   Temp: 98.5 F (36.9 C)  Resp:     Intake/Output Summary (Last 24 hours) at 11/11/14 1047 Last data filed at 11/11/14 0548  Gross per 24 hour  Intake 1558.75 ml  Output   1650 ml  Net -91.25 ml   Filed Weights   11/09/14 0530 11/10/14 0400 11/11/14 0545   Weight: 54.4 kg (119 lb 14.9 oz) 54.2 kg (119 lb 7.8 oz) 54.4 kg (119 lb 14.9 oz)    Exam:   General:  Elderly female lying in bed continues to be fatigued   HEENT: Pallor present,  dry mucosa, no thrush  supple neck,   Cardiovascular: S1 and S2, no murmurs rub or gallop  Respiratory: fine bibasilar crackles, no wheeze, no crackles or rhonchi  Abdomen: Soft, mild epigastric distention but nontender, bowel sounds normal, Foley in place  Musculoskeletal: Warm, no edema, Port-A-Cath in place, rt PICC  CNS: Alert and oriented  Data Reviewed: Basic Metabolic Panel:  Recent Labs Lab 11/06/14 0517  11/07/14 0425 11/07/14 0500 11/08/14 0455 11/08/14 0502 11/09/14 0525 11/10/14 0855 11/11/14 0518  NA  --   < > 140  --  140  --  141 144 151*  K  --   < > 2.8*  --  2.5*  --  3.3* 3.9 3.5  CL  --   < > 114*  --  109  --  112* 120* 116*  CO2  --   < > 18*  --  21*  --  20* 19* 20*  GLUCOSE  --   < > 173*  --  62*  65*  --  103* 196* 139*  BUN  --   < > 44*  --  52*  --  53* 62* 73*  CREATININE  --   < > 2.04*  --  2.35*  --  2.49* 2.71* 2.85*  CALCIUM  --   < > 7.6*  --  8.2*  --  7.9* 7.6* 8.4*  MG 1.3*  --   --  1.8  --  1.7  --   --   --   < > = values in this interval not displayed. Liver Function Tests:  Recent Labs Lab 11/05/14 2010 11/06/14 0305 11/06/14 1447  AST 26 35 66*  ALT 26 24 44  ALKPHOS 116 111 149*  BILITOT 1.1 1.1 0.9  PROT 7.6 6.5 6.3*  ALBUMIN 3.0* 2.6* 2.4*    Recent Labs Lab 11/05/14 2010  LIPASE 26   No results for input(s): AMMONIA in the last 168 hours. CBC:  Recent Labs Lab 11/07/14 0425 11/08/14 0455 11/09/14 0525 11/10/14 0620 11/11/14 0518  WBC 0.1* 0.1* 0.2* 0.1* 0.2*  NEUTROABS 0.0* 0.0* 0.0* 0.0* 0.0*  HGB 8.3* 7.5* 7.6* 6.5* 10.2*  HCT 24.1* 21.7* 22.4* 19.1* 29.8*  MCV 83.1 82.8 83.9 83.8 83.2  PLT <5* 37* 8* 26* 13*   Cardiac Enzymes:  Recent Labs Lab 11/10/14 0855 11/10/14 1350 11/10/14 1927  TROPONINI  0.27* 0.25* 0.25*   BNP (last 3 results)  Recent Labs  11/10/14 0855  BNP 1614.4*    ProBNP (last 3 results) No results for input(s): PROBNP in the last 8760 hours.  CBG:  Recent Labs Lab 11/10/14 0817 11/10/14 1210 11/10/14 1644 11/10/14 2122 11/11/14 0745  GLUCAP 181* 154* 174* 131* 131*    Recent Results (from the past 240 hour(s))  Urine culture     Status: None   Collection Time: 11/05/14  8:03 PM  Result Value Ref Range Status   Specimen Description URINE, CLEAN CATCH  Final   Special Requests NONE  Final   Colony Count   Final    >=100,000 COLONIES/ML Performed at [facility]    Culture   Final    ESCHERICHIA COLI Performed at [facility]    Report Status 11/08/2014 FINAL  Final   Organism ID, Bacteria ESCHERICHIA COLI  Final      Susceptibility   Escherichia coli - MIC*    AMPICILLIN RESISTANT      CEFAZOLIN <=4 SENSITIVE Sensitive     CEFTRIAXONE <=1 SENSITIVE Sensitive     CIPROFLOXACIN <=0.25 SENSITIVE Sensitive     GENTAMICIN <=1 SENSITIVE Sensitive     LEVOFLOXACIN <=0.12 SENSITIVE Sensitive     NITROFURANTOIN <=16 SENSITIVE Sensitive     TOBRAMYCIN <=1 SENSITIVE Sensitive     TRIMETH/SULFA <=20 SENSITIVE Sensitive     PIP/TAZO <=4 SENSITIVE Sensitive     CEFEPIME <=1 SENSITIVE Sensitive     * ESCHERICHIA COLI  Blood culture (routine x 2)     Status: None   Collection Time: 11/05/14  9:46 PM  Result Value Ref Range Status   Specimen Description BLOOD LEFT ANTECUBITAL  Final   Special Requests BOTTLES DRAWN AEROBIC ONLY 3ML  Final   Culture   Final    KLEBSIELLA OXYTOCA Note: Gram Stain Report Called to,Read Back By and Verified With: JESSICA C 11/06/14 1535 BY SMITHERSJ Performed at [facility]    Report Status 11/08/2014 FINAL  Final   Organism ID, Bacteria KLEBSIELLA OXYTOCA  Final      Susceptibility   Klebsiella oxytoca - MIC*    AMPICILLIN RESISTANT      AMPICILLIN/SULBACTAM 4 SENSITIVE Sensitive      CEFAZOLIN 16 INTERMEDIATE Intermediate     CEFEPIME <=1 SENSITIVE Sensitive     CEFTAZIDIME <=1 SENSITIVE Sensitive     CEFTRIAXONE <=1 SENSITIVE Sensitive     CIPROFLOXACIN <=0.25 SENSITIVE Sensitive     GENTAMICIN <=1 SENSITIVE Sensitive  IMIPENEM <=0.25 SENSITIVE Sensitive     PIP/TAZO <=4 SENSITIVE Sensitive     TOBRAMYCIN <=1 SENSITIVE Sensitive     TRIMETH/SULFA <=20 SENSITIVE Sensitive     * KLEBSIELLA OXYTOCA  Blood culture (routine x 2)     Status: None (Preliminary result)   Collection Time: 11/06/14  3:05 AM  Result Value Ref Range Status   Specimen Description BLOOD RIGHT HAND  Final   Special Requests BOTTLES DRAWN AEROBIC ONLY 5CC  Final   Culture   Final           BLOOD CULTURE RECEIVED NO GROWTH TO DATE CULTURE WILL BE HELD FOR 5 DAYS BEFORE ISSUING A FINAL NEGATIVE REPORT Performed at [facility]    Report Status PENDING  Incomplete  MRSA PCR Screening     Status: None   Collection Time: 11/06/14  9:49 AM  Result Value Ref Range Status   MRSA by PCR NEGATIVE NEGATIVE Final    Comment:        The GeneXpert MRSA Assay (FDA approved for NASAL specimens only), is one component of a comprehensive MRSA colonization surveillance program. It is not intended to diagnose MRSA infection nor to guide or monitor treatment for MRSA infections.   Culture, blood (routine x 2)     Status: None (Preliminary result)   Collection Time: 11/09/14  9:55 AM  Result Value Ref Range Status   Specimen Description BLOOD LEFT ARM  Final   Special Requests BOTTLES DRAWN AEROBIC AND ANAEROBIC 10CC  Final   Culture   Final           BLOOD CULTURE RECEIVED NO GROWTH TO DATE CULTURE WILL BE HELD FOR 5 DAYS BEFORE ISSUING A FINAL NEGATIVE REPORT Performed at [facility]    Report Status PENDING  Incomplete  Culture, blood (routine x 2)     Status: None (Preliminary result)   Collection Time: 11/09/14 10:02 AM  Result Value Ref Range Status   Specimen Description  BLOOD LEFT ARM  Final   Special Requests BOTTLES DRAWN AEROBIC ONLY 8CC  Final   Culture   Final           BLOOD CULTURE RECEIVED NO GROWTH TO DATE CULTURE WILL BE HELD FOR 5 DAYS BEFORE ISSUING A FINAL NEGATIVE REPORT Performed at [facility]    Report Status PENDING  Incomplete     Studies: Ct Abdomen Pelvis Wo Contrast  11/10/2014   CLINICAL DATA:  Abdominal distention. Diffuse abdominal pain with generalized weakness. Enteritis on CT scan 11/06/2014.  EXAM: CT ABDOMEN AND PELVIS WITHOUT CONTRAST  TECHNIQUE: Multidetector CT imaging of the abdomen and pelvis was performed following the standard protocol without IV contrast.  COMPARISON:  11/06/2014.  FINDINGS: Moderate RIGHT pleural effusion with collapse/ consolidation of the RIGHT middle and RIGHT lower lobe is similar to prior exam. Coronary artery atherosclerosis is present. If office based assessment of coronary risk factors has not been performed, it is now recommended. Central line is present terminating at the cavoatrial junction. Low attenuation of the intravascular compartment consistent with anemia. Small LEFT pleural effusion with associated atelectasis.  Liver and spleen show old granulomatous disease. Cholecystectomy. Pancreas and common bile duct are grossly normal. Renal hilar calculi or vascular calcifications are present. No hydronephrosis. Both ureters appear within normal limits.  Foley catheter in the urinary bladder. The bladder is distended. LEFT adrenal nodule is unchanged compared to recent prior. Anasarca is present.  Stomach is distended with oral contrast.  Unchanged nonspecific mild duodenal mural thickening in the third part of the duodenum. The jejunal thickening has resolves and may have been due to underdistention. Oral contrast is present in small bowel. No small bowel obstruction. Oral contrast has progressed through the colon. Contrast is present in the rectosigmoid.  Hysterectomy. Surgical clips in the LEFT  anatomic pelvis. Lumbar fixation hardware noted. T11 compression fracture is unchanged. Sclerosis of the RIGHT L5 transverse process secondary to metastatic disease.  IMPRESSION: 1. Resolution of jejunal loops thickening, probably due to underdistention on the prior exam. Inflammatory changes of the duodenum may be associated with enteric infection or peptic ulcer disease. Given the recent therapy for small cell carcinoma, treatment related enteritis is probably most likely. Metastatic disease to the duodenum is considered less likely in this patient with non-small cell lung carcinoma. 2. Moderate RIGHT pleural effusion with collapse/consolidation of the RIGHT middle and RIGHT lower lobe. 3. Atherosclerosis and coronary artery disease. Old granulomatous disease. 4. Unchanged LEFT adrenal nodule compatible with metastatic disease. Bony metastatic disease unchanged.   Electronically Signed   By: Dereck Ligas M.D.   On: 11/10/2014 15:59   US Renal  11/09/2014   CLINICAL DATA:  Acute renal failure  EXAM: RENAL / URINARY TRACT ULTRASOUND COMPLETE  COMPARISON:  CT 11/06/2014  FINDINGS: Right Kidney:  Length: 9.3 cm. Increased cortical echogenicity. Small amount of perinephric fluid is stable. No hydronephrosis.  Left Kidney:  Length: 11.1 cm in length. No hydronephrosis or mass. Increased cortical echogenicity is noted.  Bladder:  Foley catheter decompresses the bladder.  IMPRESSION: There is increased cortical echogenicity compatible with medical renal parenchymal disease  Small amount of right perinephric fluid is stable.  Decompressed bladder.  No hydronephrosis.   Electronically Signed   By: Marybelle Killings M.D.   On: 11/09/2014 12:16   Dg Chest Port 1 View  11/11/2014   CLINICAL DATA:  Dyspnea.  History of lung cancer and hypertension.  EXAM: PORTABLE CHEST - 1 VIEW  COMPARISON:  11/08/2014.  PET-CT dated 08/06/2014.  FINDINGS: Right PICC tip in the region of the superior cavoatrial junction. The right heart  borders remain obscured with no gross cardiac enlargement. No significant change in pleural fluid and airspace opacity on the right with previously demonstrated hypermetabolic malignancy in that area. Stable calcified right hilar nodes. Stable mildly prominent interstitial markings. The left lung remains clear. Thoracic spine and bilateral shoulder degenerative changes. Superior migration of the humeral heads, compatible with chronic rotator cuff tears, greater on the right.  IMPRESSION: 1. Stable right lung opacity and pleural fluid. 2. Stable chronic interstitial lung disease.   Electronically Signed   By: Claudie Revering M.D.   On: 11/11/2014 08:20    Scheduled Meds: . sodium chloride   Intravenous Once  . sodium chloride   Intravenous Once  . antiseptic oral rinse  7 mL Mouth Rinse q12n4p  . calcium-vitamin D  1 tablet Oral q morning - 10a  . cefTRIAXone (ROCEPHIN)  IV  1 g Intravenous Q24H  . chlorhexidine  15 mL Mouth Rinse BID  . cloNIDine  0.2 mg Oral BID  . feeding supplement (ENSURE ENLIVE)  237 mL Oral BID BM  . fenofibrate  160 mg Oral Daily  . ferrous sulfate  325 mg Oral TID WC  . fluconazole (DIFLUCAN) IV  100 mg Intravenous Q24H  . folic acid  1 mg Oral Daily  . insulin aspart  0-9 Units Subcutaneous TID WC  . ipratropium-albuterol  3 mL  Nebulization TID  . metoprolol tartrate  25 mg Oral BID  .  morphine injection  1 mg Intravenous Once  . pantoprazole  40 mg Oral Daily  . potassium chloride  20 mEq Oral Once  . ranitidine  75 mg Oral Once  . rosuvastatin  20 mg Oral Daily  . sodium chloride  10-40 mL Intracatheter Q12H  . sodium chloride  3 mL Intravenous Q12H  . Tbo-filgastrim (GRANIX) SQ  300 mcg Subcutaneous q1800   Continuous Infusions:      Time spent: [place], [place]  [facility] Pager [phone]. If 7PM-7AM, please contact night-coverage at [email or web address], password [facility] 11/11/2014, 10:47 AM  LOS: 6 days

## 2014-11-12 ENCOUNTER — Ambulatory Visit (HOSPITAL_COMMUNITY): Payer: Medicare Other

## 2014-11-12 ENCOUNTER — Inpatient Hospital Stay (HOSPITAL_COMMUNITY): Payer: Medicare Other

## 2014-11-12 ENCOUNTER — Ambulatory Visit (HOSPITAL_COMMUNITY): Admission: RE | Admit: 2014-11-12 | Payer: Medicare Other | Source: Ambulatory Visit

## 2014-11-12 LAB — CBC WITH DIFFERENTIAL/PLATELET
Basophils Absolute: 0 10*3/uL (ref 0.0–0.1)
Basophils Relative: 0 % (ref 0–1)
EOS PCT: 0 % (ref 0–5)
Eosinophils Absolute: 0 10*3/uL (ref 0.0–0.7)
HEMATOCRIT: 29.3 % — AB (ref 36.0–46.0)
Hemoglobin: 9.8 g/dL — ABNORMAL LOW (ref 12.0–15.0)
Lymphocytes Relative: 82 % — ABNORMAL HIGH (ref 12–46)
Lymphs Abs: 0.2 10*3/uL — ABNORMAL LOW (ref 0.7–4.0)
MCH: 27.7 pg (ref 26.0–34.0)
MCHC: 33.4 g/dL (ref 30.0–36.0)
MCV: 82.8 fL (ref 78.0–100.0)
MONO ABS: 0 10*3/uL — AB (ref 0.1–1.0)
MONOS PCT: 6 % (ref 3–12)
NEUTROS PCT: 12 % — AB (ref 43–77)
Neutro Abs: 0 10*3/uL — ABNORMAL LOW (ref 1.7–7.7)
Platelets: <5 10*3/uL — CL (ref 150–400)
RBC: 3.54 MIL/uL — ABNORMAL LOW (ref 3.87–5.11)
RDW: 15.6 % — ABNORMAL HIGH (ref 11.5–15.5)
WBC: 0.2 10*3/uL — CL (ref 4.0–10.5)

## 2014-11-12 LAB — CULTURE, BLOOD (ROUTINE X 2): Culture: NO GROWTH

## 2014-11-12 LAB — BASIC METABOLIC PANEL
ANION GAP: 11 (ref 5–15)
BUN: 80 mg/dL — AB (ref 6–20)
CALCIUM: 8.1 mg/dL — AB (ref 8.9–10.3)
CO2: 19 mmol/L — ABNORMAL LOW (ref 22–32)
CREATININE: 3.3 mg/dL — AB (ref 0.44–1.00)
Chloride: 117 mmol/L — ABNORMAL HIGH (ref 101–111)
GFR calc non Af Amer: 13 mL/min — ABNORMAL LOW (ref >60)
GFR, EST AFRICAN AMERICAN: 15 mL/min — AB (ref >60)
Glucose, Bld: 114 mg/dL — ABNORMAL HIGH (ref 70–99)
Potassium: 3.3 mmol/L — ABNORMAL LOW (ref 3.5–5.1)
Sodium: 147 mmol/L — ABNORMAL HIGH (ref 135–145)

## 2014-11-12 LAB — TYPE AND SCREEN
ABO/RH(D): B NEG
Antibody Screen: NEGATIVE
Unit division: 0
Unit division: 0

## 2014-11-12 LAB — GLUCOSE, CAPILLARY
GLUCOSE-CAPILLARY: 110 mg/dL — AB (ref 70–99)
Glucose-Capillary: 104 mg/dL — ABNORMAL HIGH (ref 70–99)
Glucose-Capillary: 132 mg/dL — ABNORMAL HIGH (ref 70–99)
Glucose-Capillary: 118 mg/dL — ABNORMAL HIGH (ref 70–99)

## 2014-11-12 LAB — URIC ACID: Uric Acid, Serum: 8.5 mg/dL — ABNORMAL HIGH (ref 2.3–6.6)

## 2014-11-12 MED ORDER — MORPHINE SULFATE 2 MG/ML IJ SOLN
1.0000 mg | INTRAMUSCULAR | Status: DC | PRN
  Administered 2014-11-12 – 2014-11-13 (×2): 2 mg via INTRAVENOUS
  Filled 2014-11-12 (×2): qty 1

## 2014-11-12 MED ORDER — POTASSIUM CHLORIDE CRYS ER 20 MEQ PO TBCR
40.0000 meq | EXTENDED_RELEASE_TABLET | Freq: Once | ORAL | Status: AC
  Administered 2014-11-12: 40 meq via ORAL
  Filled 2014-11-12: qty 2

## 2014-11-12 MED ORDER — SODIUM CHLORIDE 0.9 % IV SOLN: Freq: Once | INTRAVENOUS | Status: DC

## 2014-11-12 MED ORDER — ATORVASTATIN CALCIUM 40 MG PO TABS
40.0000 mg | ORAL_TABLET | Freq: Every day | ORAL | Status: DC
  Administered 2014-11-13: 40 mg via ORAL
  Filled 2014-11-12 (×2): qty 1

## 2014-11-12 MED ORDER — ALUM & MAG HYDROXIDE-SIMETH 200-200-20 MG/5ML PO SUSP
30.0000 mL | Freq: Four times a day (QID) | ORAL | Status: DC | PRN
  Administered 2014-11-12: 30 mL via ORAL
  Filled 2014-11-12: qty 30

## 2014-11-12 NOTE — Progress Notes (Signed)
OT Cancellation Note  Patient Details Name: Michaela Shaw MRN: [id number] DOB: [birth year]   Cancelled Treatment:    Reason Eval/Treat Not Completed: Medical issues which prohibited therapy Platelets <5; elevated troponin 5/7; K 3.3. Will re check tomorrow.  [place], OTR/L  [phone] 11/12/2014 11/12/2014, 12:10 PM

## 2014-11-12 NOTE — Progress Notes (Signed)
iredness yesterday.[facility] PROGRESS NOTE  Michaela Shaw IWP:[id number] DOB: [birth year] DOA: 11/05/2014 PCP: [name], NP   Brief narrative 74 year old female with history of non-small cell lung cancer stage IV (diagnosed 2 months back and started on chemotherapy, last chemotherapy on 10/25/2014), type 2 diabetes mellitus, history of stroke, GERD, chronic kidney stage III (baseline creatinine around 1.2), hypertension, hyperlipidemia and peripheral artery disease who presented with nausea vomiting and abdominal pain since 3-4 days. Patient had generalized weakness since she started chemotherapy. However for the past few days she has nonbloody vomiting generalized weakness with poor appetite. She also reports dysphagia but no fever or chills. Patient also had diffuse abdominal pain. As outpatient she had received Neupogen 2 weeks back after chemotherapy and 2 units PRBC 3 days prior to admission. In the ED patient was found to be superior to septic with fever, tachycardia, pancytopenia (WBC 0.2, hemoglobin of 10 and platelets of 14). She also was hypokalemic with acute on chronic kidney disease and elevated lactic acid. Chest x-ray shows increasing right pleural effusion and unchanged right perihilar opacity. CT of her abdomen and pelvis done showed marked mural thickening of the duodenum and proximal jejunum without obstruction suggestive of enteritis. Also showed enlargement of the left adrenal metastases. Patient admitted to stepdown for close monitoring.    Assessment/Plan:  Principal problem Pancytopenia with severe thrombocytopenia Secondary to recent chemotherapy. Appreciate oncology follow-up. Received 2 units platelets on 5/4 and on 5/6 . Platelet less than 10 again today and transfusing to more units. ANC still <100. Receiving Granix since 5/3. Hb improved after 2 units PRBC on 5/7..  -Spoke with Dr. Julien Nordmann today. Recommends that she likely has persistent pancytopenia  due to poor renal clearance of the chemotherapy. Monitor closely.  Active problem  Severe sepsis with neutropenic fever -Secondary to enteritis, Escherichia coli UTI and Klebsiella bacteremia. -Sepsis has  resolved but hospital course complicated due to persistent neutropenia and worsening renal function. -Antibiotic narrowed. -Continue IV fluconazole. -Pain control with when necessary morphine. Supportive care with Tylenol and antiemetics.  Active problem  Acute on chronic kidney disease stage III Prerenal secondary to severe dehydration vs ATN due to sepsis/ chemotherapy-induced nephrotoxicity -CT abdomen and pelvis without signs of hydronephrosis. Now Off Fluids. Avoid nephrotoxins.  Renal function progressively worsening. renal ultrasound shows medical renal ds. -Renal function progressively worsening despite good urine output.  Will call nephrology consult.  Klebsiella bacteremia Possible source is enteritis/? PNA.  2-D echo without  vegetations. Antibiotic narrowed to IV Rocephin with plan on total 2 weeks of antibiotics.  repeat blood culture negative.    Acute  systolic CHF/?ischemic cardiomyopathy Echo shows EF of 20% with diffuse hypokinesis. EKG shows ST depressions seen in lateal leads with mild elevated troponin.. Placed on BB.  held ASA due to low platelets. Cannot use ACEi due to poor renal function. Spoke with cardiology consult Dr Marlou Porch on 5/7.Marland Kitchen Not much to offer from cardiac standpoint. She is not a candidate for stress test or cardiac cath at this time and recommend to continue current management. Has good urine output and and clinically feeling better every day.    Abdominal distention with enteritis Repeat CT abdomen on 5/7 shows improved enteritis with no small bowel obstruction. Inflammation in the duodenum possibly due to enteritis versus peptic ulcer disease. Continue Protonix. Added Maalox.  Non-small cell lung cancer stage IV  - extensive hypermetabolic  tumor involving rt lung, rt hilum and mediastinum with metastatic pulm nodule, metastasis to left adrenals  and lumbar vertebrae, on PET scan. Started on chemotherapy (carboplatin and Altima) on 09/06/14 last chemotherapy on 10/25/2014. Follows with Dr. Julien Nordmann .   Hypokalemia and hypomagnesemia Replenish as needed.  Hypernatremia Ordered free water   Type 2 diabetes mellitus with hypoglycemia Recent A1c of 7.3. Hypoglycemia due to poor by mouth intake. Improved with D5 drip.    Dysphagia with mucositis Continue IV fluconazole and Magic mouthwash with lidocaine Seen by swallow evaluation and recommend Carb modified diet. Encouraged by mouth intake.   GERD CT abdomen shows possible peptic ulcer disease. Will increase PPI to twice a day. Abdominal pain better today.  Essential hypertension Holding amlodipine. Continue clonidine.    Peripheral arterial disease Holding  Pletal and aspirin given severe thrombocytopenia  Dyslipidemia Continue fenofibrate and statin   Iron deficiency anemia Started on iron supplements  Protein calorie malnutrition Nutrition consult appreciated   generalized weakness  PT eval pending    DVT prophylaxis: SCD  Diet : Regular.  Poor IV access: right PICC line    Code Status: DNR ( after discussion with pt  with dauguter on 5/7) Family Communication: none at bedside today. Will update family Disposition Plan: continue stepdown monitoring. Condition very  Guarded due to overall deconditioning , severe cardiomyopathy and worsening renal function,  Despite symptomatic  Improvement.  Consultants:  oncology  Procedures:  CT abd and pelvis  Antibiotics:  IV vancomycin and cefepime since 5/2-5/5  IV cefepime 5/5-5/6  IV Rocephin 5/6  HPI/Subjective: Reports feeling better every day and has improved appetite. Was abe tog et t the chair yesterday.   Objective: Filed Vitals:   11/12/14 1205  BP: 177/85  Pulse: 95  Temp: 98.6 F  (37 C)  Resp: 29    Intake/Output Summary (Last 24 hours) at 11/12/14 1212 Last data filed at 11/12/14 1200  Gross per 24 hour  Intake    620 ml  Output    850 ml  Net   -230 ml   Filed Weights   11/10/14 0400 11/11/14 0545 11/12/14 0600  Weight: 54.2 kg (119 lb 7.8 oz) 54.4 kg (119 lb 14.9 oz) 53.4 kg (117 lb 11.6 oz)    Exam:   General:  Fatigued, appears cheerful today  HEENT: no thrush  supple neck,   Cardiovascular: S1 and S2, no murmurs rub or gallop  Respiratory:clear b/l, no added sounds  Abdomen: Soft, mild epigastric distention but nontender, bowel sounds normal, Foley in place  Musculoskeletal: Warm, no edema, Port-A-Cath in place, rt PICC,   CNS: Alert and oriented  Data Reviewed: Basic Metabolic Panel:  Recent Labs Lab 11/06/14 0517  11/07/14 0500 11/08/14 0455 11/08/14 0502 11/09/14 0525 11/10/14 0855 11/11/14 0518 11/12/14 0600  NA  --   < >  --  140  --  141 144 151* 147*  K  --   < >  --  2.5*  --  3.3* 3.9 3.5 3.3*  CL  --   < >  --  109  --  112* 120* 116* 117*  CO2  --   < >  --  21*  --  20* 19* 20* 19*  GLUCOSE  --   < >  --  62*  65*  --  103* 196* 139* 114*  BUN  --   < >  --  52*  --  53* 62* 73* 80*  CREATININE  --   < >  --  2.35*  --  2.49* 2.71* 2.85* 3.30*  CALCIUM  --   < >  --  8.2*  --  7.9* 7.6* 8.4* 8.1*  MG 1.3*  --  1.8  --  1.7  --   --   --   --   < > = values in this interval not displayed. Liver Function Tests:  Recent Labs Lab 11/05/14 2010 11/06/14 0305 11/06/14 1447  AST 26 35 66*  ALT 26 24 44  ALKPHOS 116 111 149*  BILITOT 1.1 1.1 0.9  PROT 7.6 6.5 6.3*  ALBUMIN 3.0* 2.6* 2.4*    Recent Labs Lab 11/05/14 2010  LIPASE 26   No results for input(s): AMMONIA in the last 168 hours. CBC:  Recent Labs Lab 11/08/14 0455 11/09/14 0525 11/10/14 0620 11/11/14 0518 11/12/14 0600  WBC 0.1* 0.2* 0.1* 0.2* 0.2*  NEUTROABS 0.0* 0.0* 0.0* 0.0* 0.0*  HGB 7.5* 7.6* 6.5* 10.2* 9.8*  HCT 21.7* 22.4*  19.1* 29.8* 29.3*  MCV 82.8 83.9 83.8 83.2 82.8  PLT 37* 8* 26* 13* <5*   Cardiac Enzymes:  Recent Labs Lab 11/10/14 0855 11/10/14 1350 11/10/14 1927  TROPONINI 0.27* 0.25* 0.25*   BNP (last 3 results)  Recent Labs  11/10/14 0855  BNP 1614.4*    ProBNP (last 3 results) No results for input(s): PROBNP in the last 8760 hours.  CBG:  Recent Labs Lab 11/11/14 0745 11/11/14 1117 11/11/14 1619 11/11/14 2136 11/12/14 0805  GLUCAP 131* 145* 151* 120* 104*    Recent Results (from the past 240 hour(s))  Urine culture     Status: None   Collection Time: 11/05/14  8:03 PM  Result Value Ref Range Status   Specimen Description URINE, CLEAN CATCH  Final   Special Requests NONE  Final   Colony Count   Final    >=100,000 COLONIES/ML Performed at [facility]    Culture   Final    ESCHERICHIA COLI Performed at [facility]    Report Status 11/08/2014 FINAL  Final   Organism ID, Bacteria ESCHERICHIA COLI  Final      Susceptibility   Escherichia coli - MIC*    AMPICILLIN RESISTANT      CEFAZOLIN <=4 SENSITIVE Sensitive     CEFTRIAXONE <=1 SENSITIVE Sensitive     CIPROFLOXACIN <=0.25 SENSITIVE Sensitive     GENTAMICIN <=1 SENSITIVE Sensitive     LEVOFLOXACIN <=0.12 SENSITIVE Sensitive     NITROFURANTOIN <=16 SENSITIVE Sensitive     TOBRAMYCIN <=1 SENSITIVE Sensitive     TRIMETH/SULFA <=20 SENSITIVE Sensitive     PIP/TAZO <=4 SENSITIVE Sensitive     CEFEPIME <=1 SENSITIVE Sensitive     * ESCHERICHIA COLI  Blood culture (routine x 2)     Status: None   Collection Time: 11/05/14  9:46 PM  Result Value Ref Range Status   Specimen Description BLOOD LEFT ANTECUBITAL  Final   Special Requests BOTTLES DRAWN AEROBIC ONLY 3ML  Final   Culture   Final    KLEBSIELLA OXYTOCA Note: Gram Stain Report Called to,Read Back By and Verified With: JESSICA C 11/06/14 1535 BY SMITHERSJ Performed at [facility]    Report Status 11/08/2014 FINAL  Final   Organism  ID, Bacteria KLEBSIELLA OXYTOCA  Final      Susceptibility   Klebsiella oxytoca - MIC*    AMPICILLIN RESISTANT      AMPICILLIN/SULBACTAM 4 SENSITIVE Sensitive     CEFAZOLIN 16 INTERMEDIATE Intermediate     CEFEPIME <=1 SENSITIVE Sensitive     CEFTAZIDIME <=  1 SENSITIVE Sensitive     CEFTRIAXONE <=1 SENSITIVE Sensitive     CIPROFLOXACIN <=0.25 SENSITIVE Sensitive     GENTAMICIN <=1 SENSITIVE Sensitive     IMIPENEM <=0.25 SENSITIVE Sensitive     PIP/TAZO <=4 SENSITIVE Sensitive     TOBRAMYCIN <=1 SENSITIVE Sensitive     TRIMETH/SULFA <=20 SENSITIVE Sensitive     * KLEBSIELLA OXYTOCA  Blood culture (routine x 2)     Status: None   Collection Time: 11/06/14  3:05 AM  Result Value Ref Range Status   Specimen Description BLOOD RIGHT HAND  Final   Special Requests BOTTLES DRAWN AEROBIC ONLY 5CC  Final   Culture   Final    NO GROWTH 5 DAYS Performed at [facility]    Report Status 11/12/2014 FINAL  Final  MRSA PCR Screening     Status: None   Collection Time: 11/06/14  9:49 AM  Result Value Ref Range Status   MRSA by PCR NEGATIVE NEGATIVE Final    Comment:        The GeneXpert MRSA Assay (FDA approved for NASAL specimens only), is one component of a comprehensive MRSA colonization surveillance program. It is not intended to diagnose MRSA infection nor to guide or monitor treatment for MRSA infections.   Culture, blood (routine x 2)     Status: None (Preliminary result)   Collection Time: 11/09/14  9:55 AM  Result Value Ref Range Status   Specimen Description BLOOD LEFT ARM  Final   Special Requests BOTTLES DRAWN AEROBIC AND ANAEROBIC 10CC  Final   Culture   Final           BLOOD CULTURE RECEIVED NO GROWTH TO DATE CULTURE WILL BE HELD FOR 5 DAYS BEFORE ISSUING A FINAL NEGATIVE REPORT Performed at [facility]    Report Status PENDING  Incomplete  Culture, blood (routine x 2)     Status: None (Preliminary result)   Collection Time: 11/09/14 10:02 AM  Result  Value Ref Range Status   Specimen Description BLOOD LEFT ARM  Final   Special Requests BOTTLES DRAWN AEROBIC ONLY 8CC  Final   Culture   Final           BLOOD CULTURE RECEIVED NO GROWTH TO DATE CULTURE WILL BE HELD FOR 5 DAYS BEFORE ISSUING A FINAL NEGATIVE REPORT Performed at [facility]    Report Status PENDING  Incomplete     Studies: Ct Abdomen Pelvis Wo Contrast  11/10/2014   CLINICAL DATA:  Abdominal distention. Diffuse abdominal pain with generalized weakness. Enteritis on CT scan 11/06/2014.  EXAM: CT ABDOMEN AND PELVIS WITHOUT CONTRAST  TECHNIQUE: Multidetector CT imaging of the abdomen and pelvis was performed following the standard protocol without IV contrast.  COMPARISON:  11/06/2014.  FINDINGS: Moderate RIGHT pleural effusion with collapse/ consolidation of the RIGHT middle and RIGHT lower lobe is similar to prior exam. Coronary artery atherosclerosis is present. If office based assessment of coronary risk factors has not been performed, it is now recommended. Central line is present terminating at the cavoatrial junction. Low attenuation of the intravascular compartment consistent with anemia. Small LEFT pleural effusion with associated atelectasis.  Liver and spleen show old granulomatous disease. Cholecystectomy. Pancreas and common bile duct are grossly normal. Renal hilar calculi or vascular calcifications are present. No hydronephrosis. Both ureters appear within normal limits.  Foley catheter in the urinary bladder. The bladder is distended. LEFT adrenal nodule is unchanged compared to recent prior. Anasarca is present.  Stomach is distended with oral contrast. Unchanged nonspecific mild duodenal mural thickening in the third part of the duodenum. The jejunal thickening has resolves and may have been due to underdistention. Oral contrast is present in small bowel. No small bowel obstruction. Oral contrast has progressed through the colon. Contrast is present in the  rectosigmoid.  Hysterectomy. Surgical clips in the LEFT anatomic pelvis. Lumbar fixation hardware noted. T11 compression fracture is unchanged. Sclerosis of the RIGHT L5 transverse process secondary to metastatic disease.  IMPRESSION: 1. Resolution of jejunal loops thickening, probably due to underdistention on the prior exam. Inflammatory changes of the duodenum may be associated with enteric infection or peptic ulcer disease. Given the recent therapy for small cell carcinoma, treatment related enteritis is probably most likely. Metastatic disease to the duodenum is considered less likely in this patient with non-small cell lung carcinoma. 2. Moderate RIGHT pleural effusion with collapse/consolidation of the RIGHT middle and RIGHT lower lobe. 3. Atherosclerosis and coronary artery disease. Old granulomatous disease. 4. Unchanged LEFT adrenal nodule compatible with metastatic disease. Bony metastatic disease unchanged.   Electronically Signed   By: Dereck Ligas M.D.   On: 11/10/2014 15:59   Dg Chest Port 1 View  11/11/2014   CLINICAL DATA:  Dyspnea.  History of lung cancer and hypertension.  EXAM: PORTABLE CHEST - 1 VIEW  COMPARISON:  11/08/2014.  PET-CT dated 08/06/2014.  FINDINGS: Right PICC tip in the region of the superior cavoatrial junction. The right heart borders remain obscured with no gross cardiac enlargement. No significant change in pleural fluid and airspace opacity on the right with previously demonstrated hypermetabolic malignancy in that area. Stable calcified right hilar nodes. Stable mildly prominent interstitial markings. The left lung remains clear. Thoracic spine and bilateral shoulder degenerative changes. Superior migration of the humeral heads, compatible with chronic rotator cuff tears, greater on the right.  IMPRESSION: 1. Stable right lung opacity and pleural fluid. 2. Stable chronic interstitial lung disease.   Electronically Signed   By: Claudie Revering M.D.   On: 11/11/2014 08:20     Scheduled Meds: . sodium chloride   Intravenous Once  . sodium chloride   Intravenous Once  . sodium chloride   Intravenous Once  . antiseptic oral rinse  7 mL Mouth Rinse q12n4p  . calcium-vitamin D  1 tablet Oral q morning - 10a  . cefTRIAXone (ROCEPHIN)  IV  1 g Intravenous Q24H  . chlorhexidine  15 mL Mouth Rinse BID  . cloNIDine  0.2 mg Oral BID  . feeding supplement (ENSURE ENLIVE)  237 mL Oral BID BM  . fenofibrate  160 mg Oral Daily  . ferrous sulfate  325 mg Oral TID WC  . fluconazole (DIFLUCAN) IV  100 mg Intravenous Q24H  . folic acid  1 mg Oral Daily  . insulin aspart  0-9 Units Subcutaneous TID WC  . ipratropium-albuterol  3 mL Nebulization TID  . metoprolol tartrate  25 mg Oral BID  .  morphine injection  1 mg Intravenous Once  . pantoprazole  40 mg Oral BID WC  . potassium chloride  20 mEq Oral Once  . ranitidine  75 mg Oral Once  . rosuvastatin  20 mg Oral Daily  . sodium chloride  10-40 mL Intracatheter Q12H  . Tbo-filgastrim (GRANIX) SQ  300 mcg Subcutaneous q1800   Continuous Infusions:      Time spent: [place], [place]  [facility] Pager [phone]. If 7PM-7AM, please contact night-coverage at  [email or web address], password [facility] 11/12/2014, 12:12 PM  LOS: 7 days

## 2014-11-12 NOTE — Progress Notes (Signed)
PT Cancellation Note  Patient Details Name: Michaela Shaw MRN: [id number] DOB: 1941-04-18   Cancelled Treatment:    Reason Eval/Treat Not Completed: Medical issues which prohibited therapy (patient declined to sit at edge of bed, reports  feels very tired. Noted Platelets <5.)   [name] 11/12/2014, 3:17 PM [name] PT [phone]

## 2014-11-12 NOTE — Progress Notes (Signed)
Spoke with renal consult and family at bedside. Has worsening renal fn with possible cardiorenal syndrome. No intervention possible at this time.  family wish patient to be comfortable and take her home for hospice if condition deteriorates progressively. Will d/w Dr Earlie Server in am.  palliative care consulted. tx to telemetry.

## 2014-11-12 NOTE — Consult Note (Signed)
Renal Service Consult Note [place] 11/12/2014 [place] Requesting Physician:  Dr Clementeen Graham  Reason for Consult:  Renal failure HPI: The patient is a 74 y.o. year-old DM x 10 years, HTN 8 years, HL, anemia, back surgery , seizures, mild CVA. Last summer in July '15 pt had a fall. CXR showed RLL changes. F/U CT in Jan '16 showed hilar mass and she was dx'd with NSCCa of the lung. She started chemorx around 4/23 and got one round at least.  Baseline creatinine was 1.2- 1.8.  She was then admitted on 5/2 w severe leukopenia and pancytopenia. She was septic and grew Klebsiella in the blood. Sepsis has improved, however renal function has deteriorated over the last several days and creat today is up to 3.30. Creat on admission was 2.16.  Recent CT abd showed large R effusions. ECHO showed severe LV dysfunction with inferior wall akinesis. CXR 's have all showed large R effusion and CHF. Platelets 5k,  Pt denies SOB or cough, she is breathing a bit hard at rest in the bed.  Past Medical History  Past Medical History  Diagnosis Date  . History of chicken pox   . Diabetes mellitus without complication   . Hyperlipidemia   . Hypertension   . 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 Seizures 2005  . Stroke 2011    "mild" per pt.   Marland Kitchen GERD (gastroesophageal reflux disease)   . Leg swelling     Left leg; cause is unknown; however pt stated it goes away at rest  . Impaired circulation of right leg     Takes Pletal  . CKD (chronic kidney disease)   . Lung cancer    Past Surgical History  Past Surgical History  Procedure Laterality Date  . Bladder suspension  1980  . Abdominal hysterectomy  1980    partial  . Cholecystectomy  2007  . Appendectomy  2007  . Spine surgery  2012    Has rods and pins  . Cataract extraction, bilateral    . Colonoscopy    . Upper gi endoscopy    . Video bronchoscopy with endobronchial  ultrasound N/A 08/23/2014    Procedure: VIDEO BRONCHOSCOPY WITH ENDOBRONCHIAL ULTRASOUND with Biopsy;  Surgeon: Rigoberto Noel, MD;  Location: [facility] OR;  Service: Thoracic;  Laterality: N/A;   Family History  Family History  Problem Relation Age of Onset  . Heart disease Mother   . Diabetes Mother   . Alzheimer's disease Father   . Cancer Sister     colon  . Diabetes Sister   . Diabetes Daughter   . Diabetes Sister    Social History  reports that she has never smoked. She has never used smokeless tobacco. She reports that she does not drink alcohol or use illicit drugs. Allergies  Allergies  Allergen Reactions  . Metformin And Related Other (See Comments)    Kidney function   Home medications Prior to Admission medications   Medication Sig Start Date End Date Taking? Authorizing Provider  alendronate (FOSAMAX) 70 MG tablet Take 1 tablet (70 mg total) by mouth every 7 (seven) days. Take with a full glass of water on an empty stomach. 07/13/14  Yes Debbrah Alar, NP  Alum & Mag Hydroxide-Simeth (MAGIC MOUTHWASH W/LIDOCAINE) SOLN Take 5 mLs by mouth 4 (four) times daily as needed for mouth pain. 10/18/14  Yes Susanne Borders, NP  amLODipine (NORVASC) 10 MG tablet  Take 10 mg by mouth daily.  07/02/14  Yes Historical Provider, MD  aspirin 81 MG tablet Take 81 mg by mouth daily.   Yes Historical Provider, MD  Blood Glucose Monitoring Suppl (ONETOUCH VERIO) W/DEVICE KIT 1 each by Does not apply route daily. Please use to test blood sugar 4 times as instructed. Dx code: E11.40 04/26/14  Yes Philemon Kingdom, MD  Calcium Carbonate-Vitamin D (CALCIUM 600 + D PO) Take 1 tablet by mouth every morning.   Yes Historical Provider, MD  cilostazol (PLETAL) 100 MG tablet Take 1 tablet (100 mg total) by mouth 2 (two) times daily before a meal. 02/21/14  Yes Serafina Mitchell, MD  cloNIDine (CATAPRES) 0.2 MG tablet TAKE ONE TABLET BY MOUTH TWICE DAILY 07/02/14  Yes Debbrah Alar, NP  co-enzyme Q-10 30 MG  capsule Take 100 mg by mouth daily.   Yes Historical Provider, MD  dexamethasone (DECADRON) 4 MG tablet 4 mg by mouth twice a day the day before, day of and day after the chemotherapy every 3 weeks Patient taking differently: 4 mg by mouth twice a day the day before, day of and day after the chemotherapy every 3 weeks only if needed for nausea, due to raising blood sugar level 09/08/14  Yes Curt Bears, MD  fenofibrate (TRICOR) 145 MG tablet TAKE ONE TABLET BY MOUTH ONCE DAILY 08/01/14  Yes Debbrah Alar, NP  ferrous sulfate 325 (65 FE) MG tablet Take 325 mg by mouth 3 (three) times daily with meals.    Yes Historical Provider, MD  folic acid (FOLVITE) 1 MG tablet Take 1 tablet (1 mg total) by mouth daily. 09/08/14  Yes Curt Bears, MD  glucose blood (ONETOUCH VERIO) test strip Use to test blood sugar 4 times daily as instructed. Dx code: E11.40 04/26/14  Yes Philemon Kingdom, MD  HYDROcodone-homatropine [facility]) 5-1.5 MG/5ML syrup Take 5 mLs by mouth every 6 (six) hours as needed for cough. 10/10/14  Yes Adrena E Johnson, PA-C  insulin lispro (HUMALOG) 100 UNIT/ML injection To use in VGo 20 (up to 60 units daily) Patient taking differently: Inject 10-12 units in the morning, 10-12 units at lunch and 10-12 units at supper 04/26/14  Yes Philemon Kingdom, MD  insulin NPH Human (HUMULIN N,NOVOLIN N) 100 UNIT/ML injection Inject 15 units into the skin before breakfast, mixed with the Regular insulin and 10 units into the skin before dinner, mixed with the Regular insulin. Patient taking differently: Inject 20 units into the skin before breakfast,  10 units at supper 07/19/14  Yes Philemon Kingdom, MD  Insulin Syringe-Needle U-100 (RELION INSULIN SYR 0.3CC/30G) 30G X 5/16" 0.3 ML MISC Inject 1 each into the skin 2 (two) times daily.   Yes Historical Provider, MD  Insulin Syringe-Needle U-100 31G X 5/16" 0.5 ML MISC Use to inject insulin 3 times daily. 07/19/14  Yes Philemon Kingdom, MD  omeprazole  (PRILOSEC) 20 MG capsule TAKE ONE CAPSULE BY MOUTH IN THE MORNING 06/06/14  Yes Debbrah Alar, NP  ONETOUCH DELICA LANCETS 16X MISC Use to test blood sugar 4 times daily as instructed. 04/26/14  Yes Philemon Kingdom, MD  potassium chloride SA (K-DUR,KLOR-CON) 20 MEQ tablet Take 1 tablet (20 mEq total) by mouth daily. 10/26/14  Yes Curt Bears, MD  prochlorperazine (COMPAZINE) 10 MG tablet Take 1 tablet (10 mg total) by mouth every 6 (six) hours as needed for nausea or vomiting. 09/08/14  Yes Curt Bears, MD  rosuvastatin (CRESTOR) 20 MG tablet Take 1 tablet (20 mg total) by  mouth daily.   Yes Debbrah Alar, NP  sucralfate (CARAFATE) 1 GM/10ML suspension Take 10 mLs (1 g total) by mouth 4 (four) times daily -  with meals and at bedtime. 10/18/14  Yes Susanne Borders, NP  nystatin (MYCOSTATIN) 100000 UNIT/ML suspension Use as directed 5 mLs in the mouth or throat.  10/18/14   Historical Provider, MD   Liver Function Tests  Recent Labs Lab 11/05/14 2010 11/06/14 0305 11/06/14 1447  AST 26 35 66*  ALT 26 24 44  ALKPHOS 116 111 149*  BILITOT 1.1 1.1 0.9  PROT 7.6 6.5 6.3*  ALBUMIN 3.0* 2.6* 2.4*    Recent Labs Lab 11/05/14 2010  LIPASE 26   CBC  Recent Labs Lab 11/10/14 0620 11/11/14 0518 11/12/14 0600  WBC 0.1* 0.2* 0.2*  NEUTROABS 0.0* 0.0* 0.0*  HGB 6.5* 10.2* 9.8*  HCT 19.1* 29.8* 29.3*  MCV 83.8 83.2 82.8  PLT 26* 13* <5*   Basic Metabolic Panel  Recent Labs Lab 11/06/14 1447 11/07/14 0425 11/08/14 0455 11/09/14 0525 11/10/14 0855 11/11/14 0518 11/12/14 0600  NA 138 140 140 141 144 151* 147*  K 3.4* 2.8* 2.5* 3.3* 3.9 3.5 3.3*  CL 114* 114* 109 112* 120* 116* 117*  CO2 16* 18* 21* 20* 19* 20* 19*  GLUCOSE 240* 173* 62*  65* 103* 196* 139* 114*  BUN 39* 44* 52* 53* 62* 73* 80*  CREATININE 1.79* 2.04* 2.35* 2.49* 2.71* 2.85* 3.30*  CALCIUM 7.1* 7.6* 8.2* 7.9* 7.6* 8.4* 8.1*    Filed Vitals:   11/12/14 1354 11/12/14 1400 11/12/14 1426 11/12/14  1600  BP:  169/74 173/68 173/72  Pulse:  89 97 102  Temp:   98.5 F (36.9 C)   TempSrc:   Oral   Resp:  30 28 29   Height:      Weight:      SpO2: 95% 100% 95% 94%   Exam Pleasant elderly WF , not in distress, a bit tachypneic at baseline No rash, cyanosis or gangrene Sclera anicteric, throat clear +JVD Chest shows bibasilar rales and dec'd BS on the R Cor is irreg, tachy with loud S3 gallop Abd soft, mod distended w air, +BS Trace LE edema bilat Neuro is ox 3, nonfocal  UA 0-2 rbc, 3-6 wbc, prot 30 CXR as above R effusion ECHO EF 20-25%, inf wall akinetic  Assessment: 1. Acute on CKD III - in patient recently started on chemoRx for stage IV lung cancer. AKI presently i think is due to severe heart failure. There is a summation gallop/ rales/ tachypnea and CXR c/w CHF.  There are not many good options.  Can't get thoracentesis due to low plts. IV inotropes could be considered but with her other comorbidities would likely just prolong her suffering.  Have d/w primary MD and pt's family. Family feels that patient's wishes right now would be to go home with comfort care. Primary MD will start to make arrangements for home hospice care.  No further suggestions, will sign off 2. Lung cancer, stage IV 3. Severe systolic HF 4. Pancytopenia 5. S/P sepsis/ neutropenic w Klebsiella bacteremia   Plan- as above  Kelly Splinter MD (pgr) [phone]    [phone] 11/12/2014, 4:27 PM

## 2014-11-12 NOTE — Progress Notes (Signed)
Date:  Nov 12, 2014 U.R. performed for needs and level of care. Will continue to follow for Case Management needs.  Niraj Kudrna, RN, BSN, CCM   [phone] 

## 2014-11-12 NOTE — Plan of Care (Signed)
Problem: Phase I Progression Outcomes Goal: OOB as tolerated unless otherwise ordered Outcome: Completed/Met Date Met:  11/12/14 Pt stated that she did not feel up to getting OOB today despite encouragement.  Placed in chair position several times during the day. Goal: Hemodynamically stable Outcome: Completed/Met Date Met:  11/12/14 Goal is for the patient to be [place] home with Hospice to provide comfort care.  Palliative consult ordered for tomorrow.

## 2014-11-13 ENCOUNTER — Telehealth: Payer: Self-pay | Admitting: *Deleted

## 2014-11-13 DIAGNOSIS — I429 Cardiomyopathy, unspecified: Secondary | ICD-10-CM

## 2014-11-13 DIAGNOSIS — I1310 Hypertensive heart and chronic kidney disease without heart failure, with stage 1 through stage 4 chronic kidney disease, or unspecified chronic kidney disease: Secondary | ICD-10-CM | POA: Diagnosis not present

## 2014-11-13 DIAGNOSIS — R778 Other specified abnormalities of plasma proteins: Secondary | ICD-10-CM

## 2014-11-13 DIAGNOSIS — A414 Sepsis due to anaerobes: Secondary | ICD-10-CM | POA: Diagnosis present

## 2014-11-13 DIAGNOSIS — B9620 Unspecified Escherichia coli [E. coli] as the cause of diseases classified elsewhere: Secondary | ICD-10-CM | POA: Diagnosis present

## 2014-11-13 DIAGNOSIS — N390 Urinary tract infection, site not specified: Secondary | ICD-10-CM

## 2014-11-13 DIAGNOSIS — R6520 Severe sepsis without septic shock: Secondary | ICD-10-CM

## 2014-11-13 DIAGNOSIS — A4159 Other Gram-negative sepsis: Secondary | ICD-10-CM | POA: Diagnosis present

## 2014-11-13 DIAGNOSIS — R7989 Other specified abnormal findings of blood chemistry: Secondary | ICD-10-CM

## 2014-11-13 LAB — BASIC METABOLIC PANEL
Anion gap: 13 (ref 5–15)
BUN: 93 mg/dL — AB (ref 6–20)
CALCIUM: 8.3 mg/dL — AB (ref 8.9–10.3)
CHLORIDE: 119 mmol/L — AB (ref 101–111)
CO2: 20 mmol/L — ABNORMAL LOW (ref 22–32)
Creatinine, Ser: 3.49 mg/dL — ABNORMAL HIGH (ref 0.44–1.00)
GFR calc Af Amer: 14 mL/min — ABNORMAL LOW (ref >60)
GFR, EST NON AFRICAN AMERICAN: 12 mL/min — AB (ref >60)
Glucose, Bld: 89 mg/dL (ref 70–99)
Potassium: 3.8 mmol/L (ref 3.5–5.1)
SODIUM: 152 mmol/L — AB (ref 135–145)

## 2014-11-13 LAB — CBC WITH DIFFERENTIAL/PLATELET
BASOS ABS: 0 10*3/uL (ref 0.0–0.1)
Basophils Relative: 4 % — ABNORMAL HIGH (ref 0–1)
Eosinophils Absolute: 0 10*3/uL (ref 0.0–0.7)
Eosinophils Relative: 0 % (ref 0–5)
HCT: 27 % — ABNORMAL LOW (ref 36.0–46.0)
HEMOGLOBIN: 9.1 g/dL — AB (ref 12.0–15.0)
LYMPHS ABS: 0.3 10*3/uL — AB (ref 0.7–4.0)
Lymphocytes Relative: 84 % — ABNORMAL HIGH (ref 12–46)
MCH: 27.7 pg (ref 26.0–34.0)
MCHC: 33.7 g/dL (ref 30.0–36.0)
MCV: 82.1 fL (ref 78.0–100.0)
Monocytes Absolute: 0 10*3/uL — ABNORMAL LOW (ref 0.1–1.0)
Monocytes Relative: 8 % (ref 3–12)
NEUTROS ABS: 0 10*3/uL — AB (ref 1.7–7.7)
Neutrophils Relative %: 4 % — ABNORMAL LOW (ref 43–77)
Platelets: <5 10*3/uL — CL (ref 150–400)
RBC: 3.29 MIL/uL — ABNORMAL LOW (ref 3.87–5.11)
RDW: 15.8 % — AB (ref 11.5–15.5)
WBC: 0.3 10*3/uL — CL (ref 4.0–10.5)

## 2014-11-13 LAB — PREPARE PLATELET PHERESIS
UNIT DIVISION: 0
Unit division: 0

## 2014-11-13 LAB — GLUCOSE, CAPILLARY
GLUCOSE-CAPILLARY: 70 mg/dL (ref 70–99)
Glucose-Capillary: 91 mg/dL (ref 70–99)
Glucose-Capillary: 104 mg/dL — ABNORMAL HIGH (ref 70–99)

## 2014-11-13 MED ORDER — MORPHINE SULFATE 20 MG/5ML PO SOLN: 5.0000 mg | ORAL | Status: AC | PRN

## 2014-11-13 MED ORDER — METOPROLOL TARTRATE 25 MG PO TABS: 25.0000 mg | ORAL_TABLET | Freq: Two times a day (BID) | ORAL | Status: AC

## 2014-11-13 MED ORDER — SCOPOLAMINE 1 MG/3DAYS TD PT72: 1.0000 | MEDICATED_PATCH | TRANSDERMAL | Status: AC

## 2014-11-13 MED ORDER — LEVOFLOXACIN 500 MG PO TABS: 500.0000 mg | ORAL_TABLET | ORAL | Status: AC

## 2014-11-13 MED ORDER — ONDANSETRON HCL 4 MG/5ML PO SOLN: 4.0000 mg | Freq: Three times a day (TID) | ORAL | Status: AC | PRN

## 2014-11-13 MED ORDER — NYSTATIN 100000 UNIT/ML MT SUSP: 5.0000 mL | Freq: Four times a day (QID) | OROMUCOSAL | Status: AC

## 2014-11-13 MED ORDER — SENNA 8.6 MG PO TABS: 1.0000 | ORAL_TABLET | Freq: Every day | ORAL | Status: AC

## 2014-11-13 MED ORDER — MAGIC MOUTHWASH W/LIDOCAINE: 5.0000 mL | Freq: Four times a day (QID) | ORAL | Status: AC | PRN

## 2014-11-13 MED ORDER — ENSURE ENLIVE PO LIQD: 237.0000 mL | Freq: Two times a day (BID) | ORAL | Status: AC

## 2014-11-13 MED ORDER — LORAZEPAM 2 MG/ML PO CONC: 1.0000 mg | Freq: Four times a day (QID) | ORAL | Status: AC | PRN

## 2014-11-13 NOTE — Discharge Summary (Signed)
Physician Discharge Summary  Michaela Shaw KDT:[id number] DOB: 1940/12/20 DOA: 11/05/2014  PCP: Michaela Shaw., NP  Admit date: 11/05/2014 Discharge date: 11/13/2014  Time spent: 35 minutes  Recommendations for Outpatient Follow-up:  1. D/c home on home hospice. Patient will be followed by hospice of [facility].   Discharge Diagnoses:  Principal Problem:   Severe Sepsis  Active Problems:  Cardiorenal syndrome with acute renal failure Acute cardiomyopathy Severe pancytopenia Neutropenia with fever Klebsiella bacteremia Escherichia coli UTI   Non-small cell carcinoma of lung, stage 4   Chemotherapy induced neutropenia   Type 2 diabetes mellitus with neurological manifestations, uncontrolled   HTN (hypertension)   HLD (hyperlipidemia)   PVD (peripheral vascular disease)   Anemia, iron deficiency   Anorexia   Acute renal failure superimposed on stage 3 chronic kidney disease   Hypokalemia   Malnutrition of moderate degree   Elevated troponin I level   Discharge Condition: Guarded  Diet recommendation: Comfort feeds  Filed Weights   11/10/14 0400 11/11/14 0545 11/12/14 0600  Weight: 54.2 kg (119 lb 7.8 oz) 54.4 kg (119 lb 14.9 oz) 53.4 kg (117 lb 11.6 oz)    History of present illness:  Please put a admission H&P for details, in brief,  74 year old female with history of non-small cell lung cancer stage IV (diagnosed 2 months back and started on chemotherapy, last chemotherapy on 10/25/2014), type 2 diabetes mellitus, history of stroke, GERD, chronic kidney stage III (baseline creatinine around 1.2), hypertension, hyperlipidemia and peripheral artery disease who presented with nausea vomiting and abdominal pain since 3-4 days. Patient had generalized weakness since she started chemotherapy. However since few days prior to admission she started having nonbloody vomiting generalized weakness with poor appetite. She also reports dysphagia but no fever or chills. Patient  also had diffuse abdominal pain. As outpatient she had received Neupogen 2 weeks back after chemotherapy and 2 units PRBC 3 days prior to admission. In the ED patient was found to be severely septic with fever, tachycardia, pancytopenia (WBC 0.2, hemoglobin of 10 and platelets of 14). She also was hypokalemic with acute on chronic kidney disease and elevated lactic acid. Chest x-ray shows increasing right pleural effusion and unchanged right perihilar opacity. CT of her abdomen and pelvis done showed marked mural thickening of the duodenum and proximal jejunum without obstruction suggestive of enteritis. Also showed enlargement of the left adrenal metastases. Patient admitted to stepdown. Hospital course prolonged due to persistent and worsening symptoms with development of volume overload with cardiomyopathy and cardiorenal syndrome. Given poor prognosis patient made full comfort with plan on discharging home with home hospice. Patient and family of the with this.   Hospital Course:  Principal problem Pancytopenia with severe thrombocytopenia Secondary to recent chemotherapy.  [place] persistently low. Required several units of platelets and PRBC transfusion. Continued on gramix for persistent neutropenia without improvement.   Active problem  Severe sepsis with neutropenic fever -Secondary to enteritis, Escherichia coli UTI and Klebsiella bacteremia. -Sepsis has resolved but hospital course complicated due to persistent neutropenia and worsening renal function with severe deconditioning Antibiotic narrowed to Rocephin and patient will be discharged on oral Levaquin to complete a 2 weeks course of antibiotic. Treated thrush with fluconazole. Given progressive decline in symptoms patient now made comfort care.  Active problem  Acute on chronic kidney disease stage IIIWith cardiorenal syndrome.  Secondary to ATN due to sepsis/ chemotherapy-induced nephrotoxicity/ cardiorenal syndrome Renal  function progressively worsening. Patient not candidate for renal replacement therapy. Also cannot  perform therapeutic right paracentesis given such low platelets. After discussion with family patient made full comfort. Appreciate renal recommendations.  Klebsiella bacteremia Possible source is enteritis/? PNA. 2-D echo without vegetations. Antibiotic narrowed to IV Rocephinand plan on discharging home with oral Levaquin to complete a total 2 weeks of antibiotics.   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 EKG shows ST depressions seen in lateal leads with mild elevated troponin.. Placed on BB. held ASA due to low platelets. Cannot use ACEi due to poor renal function.No further recommendations per cardiology. Has extremely poor prognosis.     Abdominal distention with enteritis Repeat CT abdomen on 5/7 shows improved enteritis with no small bowel obstruction. Inflammation in the duodenum possibly due to enteritis versus peptic ulcer disease. Continue PPI and Maalox. Abdominal distention has worsened today.  Non-small cell lung cancer stage IV  - extensive hypermetabolic tumor involving rt lung, rt hilum and mediastinum with metastatic pulm nodule, metastasis to left adrenals and lumbar vertebrae, on PET scan. Started on chemotherapy (carboplatin and Altima) on 09/06/14 last chemotherapy on 10/25/2014. Follows with Dr. Julien Nordmann . Spoke with Dr. Julien Nordmann regarding worsening symptoms. He agrees with palliative care discussions at home hospice.   Hypokalemia and hypomagnesemia Replenished   Hypernatremia Ordered free water   Type 2 diabetes mellitus with hypoglycemia    Dysphagia with mucositis Given Magic mouthwash and Diflucan.    Essential hypertension . Continue clonidine.    Peripheral arterial disease Holding Pletal and aspirin given severe thrombocytopenia     Protein calorie malnutrition Will prescribe nutrition  supplements.    Given progressive decline in symptoms, worsening renal function and underlying cardiomyopathy with persistent pancytopenia, goals of care discussed with patient , her daughter [place] and son at bedside. Family wishes to take patient home hospice and make her really comfortable. They agree with minimizing medications and avoid any further blood works or tests. [facility] has met with patient and her family and will be following her at home as outpatient.  Discontinue PICC line upon discharge      Family Communication:Son and daughter at bedside  Consultants:  Oncology  Renal  Procedures:  CT abd and pelvis  Antibiotics:  IV vancomycin and cefepime since 5/2-5/5  IV cefepime 5/5-5/6  IV Rocephin 5/6-5/10   Levaquin 5/10-5/16  Discharge Exam: Filed Vitals:   11/13/14 1408  BP: 149/70  Pulse: 82  Temp: 97.8 F (36.6 C)  Resp: 21    General: Early female lying in bed appears lethargic and sleepy, tachypneic HEENT: Pallor present, dry oral mucosa, supple neck Chest: Diminished breath sounds bilaterally, tachypneic CVS: S1 and S2 tachycardic, no murmurs rub or gallop GI: Distended with some tenderness, Musculoskeletal: Warm, no edema CNS: Sleepy but arousable and oriented   Discharge Instructions    Current Discharge Medication List    START taking these medications   Details  !! Alum & Mag Hydroxide-Simeth (MAGIC MOUTHWASH W/LIDOCAINE) SOLN Take 5 mLs by mouth 4 (four) times daily as needed for mouth pain. Qty: 300 mL, Refills: 2    feeding supplement, ENSURE ENLIVE, (ENSURE ENLIVE) LIQD Take 237 mLs by mouth 2 (two) times daily between meals. Qty: 60 Bottle, Refills: 3    levofloxacin (LEVAQUIN) 500 MG tablet Take 1 tablet (500 mg total) by mouth every other day. Qty: 4 tablet, Refills: 0    LORazepam (ATIVAN) 2 MG/ML concentrated solution Take 0.5 mLs (1 mg total) by mouth every 6 (six) hours as  needed for  anxiety. Qty: 30 mL, Refills: 0    metoprolol tartrate (LOPRESSOR) 25 MG tablet Take 1 tablet (25 mg total) by mouth 2 (two) times daily. Qty: 60 tablet, Refills: 0    morphine 20 MG/5ML solution Take 1.3 mLs (5.2 mg total) by mouth every 4 (four) hours as needed for pain. Qty: 50 mL, Refills: 0    ondansetron (ZOFRAN) 4 MG/5ML solution Take 5 mLs (4 mg total) by mouth every 8 (eight) hours as needed for nausea or vomiting. Qty: 50 mL, Refills: 0    scopolamine (TRANSDERM-SCOP) 1 MG/3DAYS Place 1 patch (1.5 mg total) onto the skin every 3 (three) days. Qty: 10 patch, Refills: 0    senna (SENOKOT) 8.6 MG TABS tablet Take 1 tablet (8.6 mg total) by mouth daily. Qty: 30 each, Refills: 0     !! - Potential duplicate medications found. Please discuss with provider.    CONTINUE these medications which have CHANGED   Details  nystatin (MYCOSTATIN) 100000 UNIT/ML suspension Use as directed 5 mLs (500,000 Units total) in the mouth or throat 4 (four) times daily. Qty: 180 mL, Refills: 0   Associated Diagnoses: Non-small cell carcinoma of lung, stage 4, right; Tachycardia; Dehydration      CONTINUE these medications which have NOT CHANGED   Details  !! Alum & Mag Hydroxide-Simeth (MAGIC MOUTHWASH W/LIDOCAINE) SOLN Take 5 mLs by mouth 4 (four) times daily as needed for mouth pain. Qty: 120 mL, Refills: 1    Blood Glucose Monitoring Suppl (ONETOUCH VERIO) W/DEVICE KIT 1 each by Does not apply route daily. Please use to test blood sugar 4 times as instructed. Dx code: E11.40 Qty: 1 kit, Refills: 0    Calcium Carbonate-Vitamin D (CALCIUM 600 + D PO) Take 1 tablet by mouth every morning.    cloNIDine (CATAPRES) 0.2 MG tablet TAKE ONE TABLET BY MOUTH TWICE DAILY Qty: 60 tablet, Refills: 5    ferrous sulfate 325 (65 FE) MG tablet Take 325 mg by mouth 3 (three) times daily with meals.     folic acid (FOLVITE) 1 MG tablet Take 1 tablet (1 mg total) by mouth daily. Qty: 30 tablet, Refills: 3     omeprazole (PRILOSEC) 20 MG capsule TAKE ONE CAPSULE BY MOUTH IN THE MORNING Qty: 30 capsule, Refills: 3    ONETOUCH DELICA LANCETS 22G MISC Use to test blood sugar 4 times daily as instructed. Qty: 200 each, Refills: 5    potassium chloride SA (K-DUR,KLOR-CON) 20 MEQ tablet Take 1 tablet (20 mEq total) by mouth daily. Qty: 7 tablet, Refills: 0    prochlorperazine (COMPAZINE) 10 MG tablet Take 1 tablet (10 mg total) by mouth every 6 (six) hours as needed for nausea or vomiting. Qty: 60 tablet, Refills: 0    rosuvastatin (CRESTOR) 20 MG tablet Take 1 tablet (20 mg total) by mouth daily. Qty: 90 tablet, Refills: 3    sucralfate (CARAFATE) 1 GM/10ML suspension Take 10 mLs (1 g total) by mouth 4 (four) times daily -  with meals and at bedtime. Qty: 420 mL, Refills: 1     !! - Potential duplicate medications found. Please discuss with provider.    STOP taking these medications     alendronate (FOSAMAX) 70 MG tablet      amLODipine (NORVASC) 10 MG tablet      aspirin 81 MG tablet      cilostazol (PLETAL) 100 MG tablet      co-enzyme Q-10 30 MG capsule  dexamethasone (DECADRON) 4 MG tablet      fenofibrate (TRICOR) 145 MG tablet      glucose blood (ONETOUCH VERIO) test strip      HYDROcodone-homatropine (HYCODAN) 5-1.5 MG/5ML syrup      insulin lispro (HUMALOG) 100 UNIT/ML injection      insulin NPH Human (HUMULIN N,NOVOLIN N) 100 UNIT/ML injection      Insulin Syringe-Needle U-100 (RELION INSULIN SYR 0.3CC/30G) 30G X 5/16" 0.3 ML MISC      Insulin Syringe-Needle U-100 31G X 5/16" 0.5 ML MISC        Allergies  Allergen Reactions  . Metformin And Related Other (See Comments)    Kidney function   Follow-up Information    Please follow up.   Why:  home hospice       The results of significant diagnostics from this hospitalization (including imaging, microbiology, ancillary and laboratory) are listed below for reference.    Significant Diagnostic  Studies: Ct Abdomen Pelvis Wo Contrast  11/10/2014   CLINICAL DATA:  Abdominal distention. Diffuse abdominal pain with generalized weakness. Enteritis on CT scan 11/06/2014.  EXAM: CT ABDOMEN AND PELVIS WITHOUT CONTRAST  TECHNIQUE: Multidetector CT imaging of the abdomen and pelvis was performed following the standard protocol without IV contrast.  COMPARISON:  11/06/2014.  FINDINGS: Moderate RIGHT pleural effusion with collapse/ consolidation of the RIGHT middle and RIGHT lower lobe is similar to prior exam. Coronary artery atherosclerosis is present. If office based assessment of coronary risk factors has not been performed, it is now recommended. Central line is present terminating at the cavoatrial junction. Low attenuation of the intravascular compartment consistent with anemia. Small LEFT pleural effusion with associated atelectasis.  Liver and spleen show old granulomatous disease. Cholecystectomy. Pancreas and common bile duct are grossly normal. Renal hilar calculi or vascular calcifications are present. No hydronephrosis. Both ureters appear within normal limits.  Foley catheter in the urinary bladder. The bladder is distended. LEFT adrenal nodule is unchanged compared to recent prior. Anasarca is present.  Stomach is distended with oral contrast. Unchanged nonspecific mild duodenal mural thickening in the third part of the duodenum. The jejunal thickening has resolves and may have been due to underdistention. Oral contrast is present in small bowel. No small bowel obstruction. Oral contrast has progressed through the colon. Contrast is present in the rectosigmoid.  Hysterectomy. Surgical clips in the LEFT anatomic pelvis. Lumbar fixation hardware noted. T11 compression fracture is unchanged. Sclerosis of the RIGHT L5 transverse process secondary to metastatic disease.  IMPRESSION: 1. Resolution of jejunal loops thickening, probably due to underdistention on the prior exam. Inflammatory changes of the  duodenum may be associated with enteric infection or peptic ulcer disease. Given the recent therapy for small cell carcinoma, treatment related enteritis is probably most likely. Metastatic disease to the duodenum is considered less likely in this patient with non-small cell lung carcinoma. 2. Moderate RIGHT pleural effusion with collapse/consolidation of the RIGHT middle and RIGHT lower lobe. 3. Atherosclerosis and coronary artery disease. Old granulomatous disease. 4. Unchanged LEFT adrenal nodule compatible with metastatic disease. Bony metastatic disease unchanged.   Electronically Signed   By: Dereck Ligas M.D.   On: 11/10/2014 15:59   Ct Abdomen Pelvis Wo Contrast  11/06/2014   CLINICAL DATA:  Gradual persistent myalgias, nausea and vomiting.  EXAM: CT ABDOMEN AND PELVIS WITHOUT CONTRAST  TECHNIQUE: Multidetector CT imaging of the abdomen and pelvis was performed following the standard protocol without IV contrast.  COMPARISON:  CT 08/06/2014, CT  07/24/2014  FINDINGS: There is a large right pleural effusion, substantially increased from the prior imaging studies.  The left adrenal nodule has enlarged, measuring 10 x 16 mm and previously measuring 7 x 12 mm.  There is abnormal mural thickening of the duodenum and proximal jejunum. This is nonobstructive. Oral contrast has passed through to the terminal ileum. This may represent enteritis. There is no ascites. There is no extraluminal air.  There are unremarkable unenhanced appearances of the liver, spleen, pancreas, right adrenal and kidneys with the exception of a few nonobstructing collecting system calculi.  There is no adenopathy in the abdomen or pelvis. The abdominal aorta is normal in caliber.  There is prior cholecystectomy, appendectomy and hysterectomy.  The known skeletal lesions are not significantly changed. There is no interval change in the benign appearing moderate compression of T11.  IMPRESSION: *Marked mural thickening of the duodenum  and proximal jejunum without obstruction. This may represent enteritis. *Enlargement of the left adrenal metastasis *Large right pleural effusion, significantly increased from 08/06/2014   Electronically Signed   By: Andreas Newport M.D.   On: 11/06/2014 00:55   US Renal  11/09/2014   CLINICAL DATA:  Acute renal failure  EXAM: RENAL / URINARY TRACT ULTRASOUND COMPLETE  COMPARISON:  CT 11/06/2014  FINDINGS: Right Kidney:  Length: 9.3 cm. Increased cortical echogenicity. Small amount of perinephric fluid is stable. No hydronephrosis.  Left Kidney:  Length: 11.1 cm in length. No hydronephrosis or mass. Increased cortical echogenicity is noted.  Bladder:  Foley catheter decompresses the bladder.  IMPRESSION: There is increased cortical echogenicity compatible with medical renal parenchymal disease  Small amount of right perinephric fluid is stable.  Decompressed bladder.  No hydronephrosis.   Electronically Signed   By: Marybelle Killings M.D.   On: 11/09/2014 12:16   Dg Chest Port 1 View  11/11/2014   CLINICAL DATA:  Dyspnea.  History of lung cancer and hypertension.  EXAM: PORTABLE CHEST - 1 VIEW  COMPARISON:  11/08/2014.  PET-CT dated 08/06/2014.  FINDINGS: Right PICC tip in the region of the superior cavoatrial junction. The right heart borders remain obscured with no gross cardiac enlargement. No significant change in pleural fluid and airspace opacity on the right with previously demonstrated hypermetabolic malignancy in that area. Stable calcified right hilar nodes. Stable mildly prominent interstitial markings. The left lung remains clear. Thoracic spine and bilateral shoulder degenerative changes. Superior migration of the humeral heads, compatible with chronic rotator cuff tears, greater on the right.  IMPRESSION: 1. Stable right lung opacity and pleural fluid. 2. Stable chronic interstitial lung disease.   Electronically Signed   By: Claudie Revering M.D.   On: 11/11/2014 08:20   Dg Chest Port 1  View  11/08/2014   CLINICAL DATA:  Shortness of breath.  Fluid overload.  EXAM: PORTABLE CHEST - 1 VIEW  COMPARISON:  11/06/2014  FINDINGS: The central venous catheter is unchanged with tip near the cavoatrial junction/ high right atrium. Cardiomediastinal silhouette is unchanged, with persistent obscure aeration of the right heart border. Thoracic aortic calcification is noted. Moderate right pleural effusion does not appear significantly changed, with similar appearance of underlying right lung atelectasis/ consolidation in the setting of known right hilar and right lung tumor. Right hilar calcification is again seen. Interstitial pulmonary edema has largely resolved. No pneumothorax.  IMPRESSION: 1. Largely resolved interstitial edema. 2. Unchanged, moderate right pleural and right perihilar/right lower lung opacity.   Electronically Signed   By: Logan Bores  On: 11/08/2014 08:46   Dg Chest Port 1 View  11/06/2014   CLINICAL DATA:  Fever and shortness of breath for 2 days. History of lung carcinoma  EXAM: PORTABLE CHEST - 1 VIEW  COMPARISON:  Nov 05, 2014 chest radiograph ; PET-CT August 06, 2014  FINDINGS: There is extensive airspace consolidation throughout the right mid lower lung zones. Elsewhere there is diffuse interstitial edema, increased from 1 day prior. Heart size and pulmonary vascularity within normal limits. There is now a central catheter with tip in the superior cava. No pneumothorax. There is atherosclerotic change in aorta.  IMPRESSION: Moderate interstitial edema, likely due to congestive heart failure given change from 1 day prior. Widespread consolidation throughout the right mid and lower lung zones is again noted. There is underlying tumor in this area which is difficult to discern from surrounding pneumonitis. Heart size within normal limits.   Electronically Signed   By: Lowella Grip III M.D.   On: 11/06/2014 18:32   Dg Chest Port 1 View  11/05/2014   CLINICAL DATA:  Fever and  vomiting 48 hr  EXAM: PORTABLE CHEST - 1 VIEW  COMPARISON:  PET-CT scan 08/06/2014, radiograph 08/23/2014.  FINDINGS: Normal cardiac silhouette. There is a moderate right pleural effusions increased in volume compared to prior. This effusion slightly partially loculated. There is central venous congestion. A perihilar opacity similar to comparison PET-CT scan.  IMPRESSION: Increasing right pleural effusion. Right perihilar opacity similar to prior PET-CT   Electronically Signed   By: Suzy Bouchard M.D.   On: 11/05/2014 22:12   Dg Abd Portable 1v  11/09/2014   CLINICAL DATA:  74 year old female with a history of abdominal distention.  EXAM: PORTABLE ABDOMEN - 1 VIEW  COMPARISON:  Chest x-ray 11/08/2014, 11/05/2014, CT abdomen 11/06/2014, PET-CT 08/06/2014  FINDINGS: Gas within stomach, small bowel, and colon. Enteric contrast present within the splenic flexure and rectum. Borderline dilated cecum measures 7.5 cm.  Surgical changes of the right upper quadrant compatible with cholecystectomy, left pelvic sidewall, and lumbar spine fixation.  Diffuse osteopenia.  No displaced fracture identified.  Extensive vascular calcifications.  IMPRESSION: Gas throughout GI system, with borderline dilated cecum. This could be seen in the setting of ileus. Recommend following with interval plain film until resolution. If there is concern for acute abnormality, CT may be indicated.  Signed,  Dulcy Fanny. Earleen Newport, DO  [facility]  [facility]   Electronically Signed   By: Corrie Mckusick D.O.   On: 11/09/2014 08:59    Microbiology: Recent Results (from the past 240 hour(s))  Urine culture     Status: None   Collection Time: 11/05/14  8:03 PM  Result Value Ref Range Status   Specimen Description URINE, CLEAN CATCH  Final   Special Requests NONE  Final   Colony Count   Final    >=100,000 COLONIES/ML Performed at [facility]    Culture   Final    ESCHERICHIA  COLI Performed at [facility]    Report Status 11/08/2014 FINAL  Final   Organism ID, Bacteria ESCHERICHIA COLI  Final      Susceptibility   Escherichia coli - MIC*    AMPICILLIN RESISTANT      CEFAZOLIN <=4 SENSITIVE Sensitive     CEFTRIAXONE <=1 SENSITIVE Sensitive     CIPROFLOXACIN <=0.25 SENSITIVE Sensitive     GENTAMICIN <=1 SENSITIVE Sensitive     LEVOFLOXACIN <=0.12 SENSITIVE Sensitive     NITROFURANTOIN <=16  SENSITIVE Sensitive     TOBRAMYCIN <=1 SENSITIVE Sensitive     TRIMETH/SULFA <=20 SENSITIVE Sensitive     PIP/TAZO <=4 SENSITIVE Sensitive     CEFEPIME <=1 SENSITIVE Sensitive     * ESCHERICHIA COLI  Blood culture (routine x 2)     Status: None   Collection Time: 11/05/14  9:46 PM  Result Value Ref Range Status   Specimen Description BLOOD LEFT ANTECUBITAL  Final   Special Requests BOTTLES DRAWN AEROBIC ONLY 3ML  Final   Culture   Final    KLEBSIELLA OXYTOCA Note: Gram Stain Report Called to,Read Back By and Verified With: JESSICA C 11/06/14 1535 BY SMITHERSJ Performed at [facility]    Report Status 11/08/2014 FINAL  Final   Organism ID, Bacteria KLEBSIELLA OXYTOCA  Final      Susceptibility   Klebsiella oxytoca - MIC*    AMPICILLIN RESISTANT      AMPICILLIN/SULBACTAM 4 SENSITIVE Sensitive     CEFAZOLIN 16 INTERMEDIATE Intermediate     CEFEPIME <=1 SENSITIVE Sensitive     CEFTAZIDIME <=1 SENSITIVE Sensitive     CEFTRIAXONE <=1 SENSITIVE Sensitive     CIPROFLOXACIN <=0.25 SENSITIVE Sensitive     GENTAMICIN <=1 SENSITIVE Sensitive     IMIPENEM <=0.25 SENSITIVE Sensitive     PIP/TAZO <=4 SENSITIVE Sensitive     TOBRAMYCIN <=1 SENSITIVE Sensitive     TRIMETH/SULFA <=20 SENSITIVE Sensitive     * KLEBSIELLA OXYTOCA  Blood culture (routine x 2)     Status: None   Collection Time: 11/06/14  3:05 AM  Result Value Ref Range Status   Specimen Description BLOOD RIGHT HAND  Final   Special Requests BOTTLES DRAWN AEROBIC ONLY 5CC  Final   Culture    Final    NO GROWTH 5 DAYS Performed at [facility]    Report Status 11/12/2014 FINAL  Final  MRSA PCR Screening     Status: None   Collection Time: 11/06/14  9:49 AM  Result Value Ref Range Status   MRSA by PCR NEGATIVE NEGATIVE Final    Comment:        The GeneXpert MRSA Assay (FDA approved for NASAL specimens only), is one component of a comprehensive MRSA colonization surveillance program. It is not intended to diagnose MRSA infection nor to guide or monitor treatment for MRSA infections.   Culture, blood (routine x 2)     Status: None (Preliminary result)   Collection Time: 11/09/14  9:55 AM  Result Value Ref Range Status   Specimen Description BLOOD LEFT ARM  Final   Special Requests BOTTLES DRAWN AEROBIC AND ANAEROBIC 10CC  Final   Culture   Final           BLOOD CULTURE RECEIVED NO GROWTH TO DATE CULTURE WILL BE HELD FOR 5 DAYS BEFORE ISSUING A FINAL NEGATIVE REPORT Performed at [facility]    Report Status PENDING  Incomplete  Culture, blood (routine x 2)     Status: None (Preliminary result)   Collection Time: 11/09/14 10:02 AM  Result Value Ref Range Status   Specimen Description BLOOD LEFT ARM  Final   Special Requests BOTTLES DRAWN AEROBIC ONLY 8CC  Final   Culture   Final           BLOOD CULTURE RECEIVED NO GROWTH TO DATE CULTURE WILL BE HELD FOR 5 DAYS BEFORE ISSUING A FINAL NEGATIVE REPORT Performed at [facility]    Report Status PENDING  Incomplete  Labs: Basic Metabolic Panel:  Recent Labs Lab 11/07/14 0500  11/08/14 0502 11/09/14 0525 11/10/14 0855 11/11/14 0518 11/12/14 0600 11/13/14 0453  NA  --   < >  --  141 144 151* 147* 152*  K  --   < >  --  3.3* 3.9 3.5 3.3* 3.8  CL  --   < >  --  112* 120* 116* 117* 119*  CO2  --   < >  --  20* 19* 20* 19* 20*  GLUCOSE  --   < >  --  103* 196* 139* 114* 89  BUN  --   < >  --  53* 62* 73* 80* 93*  CREATININE  --   < >  --  2.49* 2.71* 2.85* 3.30* 3.49*  CALCIUM  --    < >  --  7.9* 7.6* 8.4* 8.1* 8.3*  MG 1.8  --  1.7  --   --   --   --   --   < > = values in this interval not displayed. Liver Function Tests: No results for input(s): AST, ALT, ALKPHOS, BILITOT, PROT, ALBUMIN in the last 168 hours. No results for input(s): LIPASE, AMYLASE in the last 168 hours. No results for input(s): AMMONIA in the last 168 hours. CBC:  Recent Labs Lab 11/09/14 0525 11/10/14 0620 11/11/14 0518 11/12/14 0600 11/13/14 0453  WBC 0.2* 0.1* 0.2* 0.2* 0.3*  NEUTROABS 0.0* 0.0* 0.0* 0.0* 0.0*  HGB 7.6* 6.5* 10.2* 9.8* 9.1*  HCT 22.4* 19.1* 29.8* 29.3* 27.0*  MCV 83.9 83.8 83.2 82.8 82.1  PLT 8* 26* 13* <5* <5*   Cardiac Enzymes:  Recent Labs Lab 11/10/14 0855 11/10/14 1350 11/10/14 1927  TROPONINI 0.27* 0.25* 0.25*   BNP: BNP (last 3 results)  Recent Labs  11/10/14 0855  BNP 1614.4*    ProBNP (last 3 results) No results for input(s): PROBNP in the last 8760 hours.  CBG:  Recent Labs Lab 11/12/14 1202 11/12/14 1649 11/12/14 2201 11/13/14 0742 11/13/14 1128  GLUCAP 132* 118* 110* 70 91       Signed:  Reita Shindler  [facility] 11/13/2014, 3:36 PM

## 2014-11-13 NOTE — Care Management Note (Signed)
Case Management Note  Patient Details  Name: Michaela Shaw MRN: [id number] Date of Birth: 09/03/1940  Subjective/Objective:                    Action/Plan:   Expected Discharge Date:  11/09/14               Expected Discharge Plan:  Home w Hospice Care  In-House Referral:  NA  Discharge planning Services  CM Consult  Post Acute Care Choice:  Hospice Choice offered to:  Patient, Adult Children  DME Arranged:    DME Agency:     HH Arranged:  RN [place] Agency:  [facility]  Status of Service:  In process, will continue to follow  Medicare Important Message Given:    Date Medicare IM Given:    Medicare IM give by:    Date Additional Medicare IM Given:    Additional Medicare Important Message give by:     If discussed at [place] of Stay Meetings, dates discussed:    Additional CommentsPurcell Mouton, RN 11/13/2014, 2:57 PM

## 2014-11-13 NOTE — Consult Note (Signed)
Palliative care consult at bedside for home medication/symptommanagement recommendations.  Patient is awake and alert with family at bedside.  She is drowsy but able to answer questions.  Denies pain at this time, c/o occasional abdominal pain when she drinks her favorite root beer.  Suggested that she take small sips at a moderate temperature.  She denies nausea.  No noted respiratory distress although she does have audible respiratory rails a bedside, cough does not clear them.  Symptom management for home hospice care would be as follows.  Acetaminophen '650mg'$  PO/PR every 6 hrs as needed for mild pain  Roxanol ('20mg'$ /ML) give '5mg'$ / S/L every 4hrs as needed for shortness of breath or increase in work of breathing and/or moderate pain  Ativan '1mg'$  S/L every 6hrs as needed  for anxiety or agitation  Zofran OTD '4mg'$  S/L every 6 hours as needed for N/V  Senna S one tab PO daily  Scopolamine patch one patch to be placed behind ear IF NEEDED for increased respiratory secretions, change q 72hrs   Would recommend O2 be available at the home for use if needed.  Other DME per family request.  [place] to order.  Ms. Greb is very happy at the thought of returning to her home, in home hospice will provide the  Necessary support for her to enjoy her time with her supportive family.  Great -grandchildren arrived for a visit bringing home made gifts, this was "the best medicine" according to Ms. Schmelzer.  Please call with any questions.  Kizzie Fantasia, MSN, RN, [place]

## 2014-11-13 NOTE — Progress Notes (Signed)
PT Cancellation Note  Patient Details Name: Michaela Shaw MRN: [id number] DOB: [birth year]   Cancelled Treatment:    Reason Eval/Treat Not Completed: Other (comment)Chart reviewed. Note that Palliative Consult pending with goals for home with Hospice. Will follow up later today for  [place] PT needs.    [name] 11/13/2014, 10:00 AM [name] PT [phone]

## 2014-11-13 NOTE — Progress Notes (Signed)
OT Cancellation Note  Patient Details Name: Michaela Shaw MRN: [id number] DOB: 03-08-1941   Cancelled Treatment:    Reason Eval/Treat Not Completed: Other (comment) note plan for palliative meeting today and critical lab values including platelets < 5. Will check back later time.  [place], [place] 11/13/2014, 12:31 PM

## 2014-11-13 NOTE — Telephone Encounter (Signed)
TC to [place] left for [employer], Therapist, sports. Dr. Julien Nordmann will be patient's attending MD with hospice

## 2014-11-13 NOTE — Telephone Encounter (Signed)
TC from [facility] [place]. Pt. Currently at [facility] and scheduled for discharge tomorrow (11/14/14) with hospice. They want to confirm thatDr. Julien Nordmann will be the attending MD, certify that she has less than 6 month prognosis, and that their Hospice physician will do symptom management.  Please advise

## 2014-11-13 NOTE — Progress Notes (Signed)
CSW consulted for transportation needs. Patient will need non-emergency ambulance transport home. CSW confirmed home address with patient's daughter at bedside. RN to call for transport when ready, packet is at the nurses station - RN, [place] aware.   No other CSW needs identified - CSW signing off.   Michaela Shaw, [facility] Clinical Social Worker cell #: [phone]

## 2014-11-13 NOTE — Progress Notes (Signed)
R PICC flushed 10cc NS each port per MD order for discharge

## 2014-11-13 NOTE — Telephone Encounter (Signed)
Ok with me to be the attending for her hospice service.

## 2014-11-14 ENCOUNTER — Ambulatory Visit: Payer: Medicare Other | Admitting: Internal Medicine

## 2014-11-15 ENCOUNTER — Other Ambulatory Visit: Payer: Medicare Other

## 2014-11-15 ENCOUNTER — Ambulatory Visit: Payer: Medicare Other | Admitting: Physician Assistant

## 2014-11-15 ENCOUNTER — Ambulatory Visit: Payer: Medicare Other

## 2014-11-15 LAB — CULTURE, BLOOD (ROUTINE X 2)
Culture: NO GROWTH
Culture: NO GROWTH

## 2014-11-26 ENCOUNTER — Telehealth: Payer: Self-pay | Admitting: Internal Medicine

## 2014-11-26 NOTE — Telephone Encounter (Signed)
Mailed pt death certificate to [facility] in [place] 11/26/14

## 2014-12-05 DEATH — deceased

## 2014-12-18 ENCOUNTER — Ambulatory Visit: Payer: Medicare Other | Admitting: Family

## 2014-12-26 ENCOUNTER — Ambulatory Visit: Payer: Medicare Other | Admitting: Family

## 2015-03-04 ENCOUNTER — Other Ambulatory Visit (HOSPITAL_COMMUNITY): Payer: Medicare Other

## 2015-03-04 ENCOUNTER — Ambulatory Visit: Payer: Medicare Other | Admitting: Surgery

## 2015-03-04 ENCOUNTER — Encounter (HOSPITAL_COMMUNITY): Payer: Medicare Other

## 2015-12-24 ENCOUNTER — Other Ambulatory Visit: Payer: Self-pay | Admitting: Nurse Practitioner

## 2016-05-08 IMAGING — CR DG CHEST 1V PORT
1 series · 1 of 1 positions shown · non-contrast
Comparison: PET-CT scan 08/06/2014, radiograph 08/23/2014.

CLINICAL DATA: Fever and vomiting 48 hr

EXAM:
PORTABLE CHEST - 1 VIEW

[AP]
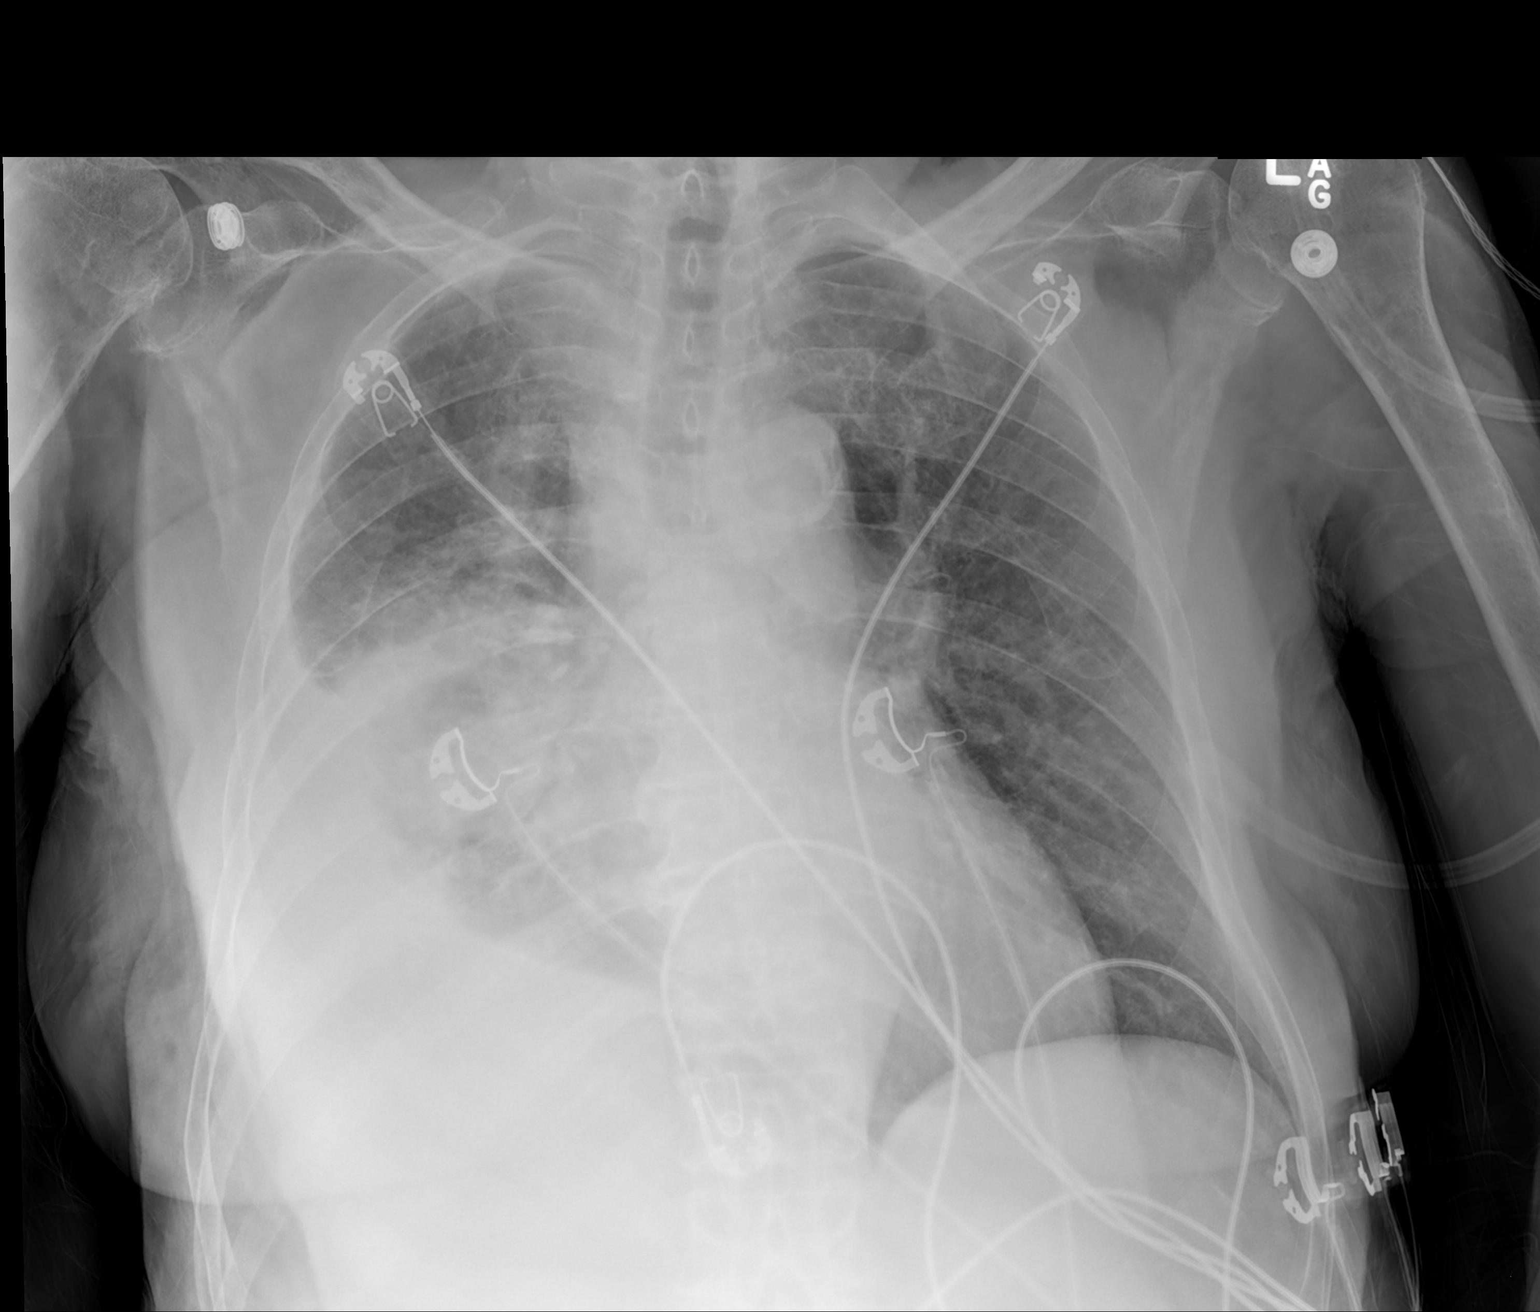

[1 of 1 positions shown; findings below may reference images not displayed]

FINDINGS: Normal cardiac silhouette. There is a moderate right pleural
effusions increased in volume compared to prior. This effusion
slightly partially loculated. There is central venous congestion. A
perihilar opacity similar to comparison PET-CT scan.
IMPRESSION: Increasing right pleural effusion. Right perihilar opacity similar
to prior PET-CT

## 2016-05-09 IMAGING — DX DG CHEST 1V PORT
1 series · 1 of 1 positions shown · non-contrast
Comparison: November 05, 2014 chest radiograph ; PET-CT August 06, 2014

CLINICAL DATA: Fever and shortness of breath for 2 days. History of
lung carcinoma

EXAM:
PORTABLE CHEST - 1 VIEW

[chest ap]
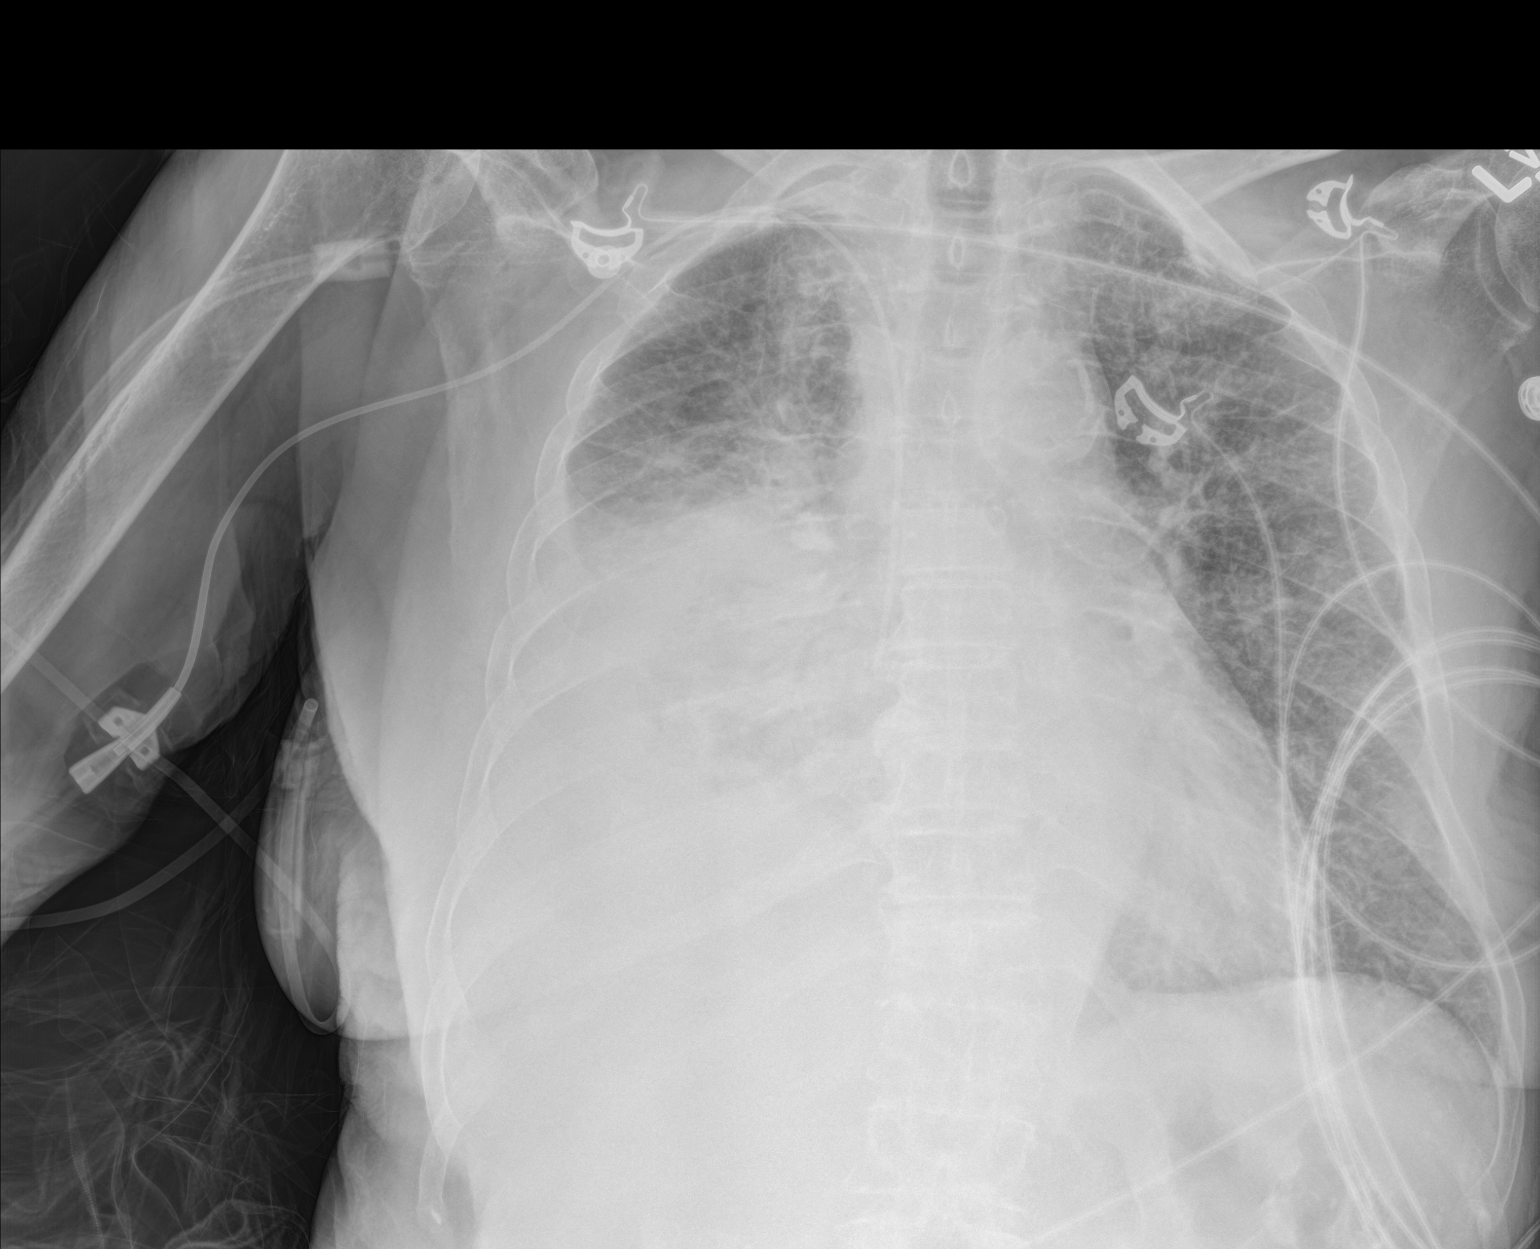

[1 of 1 positions shown; findings below may reference images not displayed]

FINDINGS: There is extensive airspace consolidation throughout the right mid
lower lung zones. Elsewhere there is diffuse interstitial edema,
increased from 1 day prior. Heart size and pulmonary vascularity
within normal limits. There is now a central catheter with tip in
the superior cava. No pneumothorax. There is atherosclerotic change
in aorta.
IMPRESSION: Moderate interstitial edema, likely due to congestive heart failure
given change from 1 day prior. Widespread consolidation throughout
the right mid and lower lung zones is again noted. There is
underlying tumor in this area which is difficult to discern from
surrounding pneumonitis. Heart size within normal limits.

## 2016-05-11 IMAGING — CR DG CHEST 1V PORT
1 series · 1 of 1 positions shown · non-contrast
Comparison: 11/06/2014

CLINICAL DATA: Shortness of breath.  Fluid overload.

EXAM:
PORTABLE CHEST - 1 VIEW

[AP]
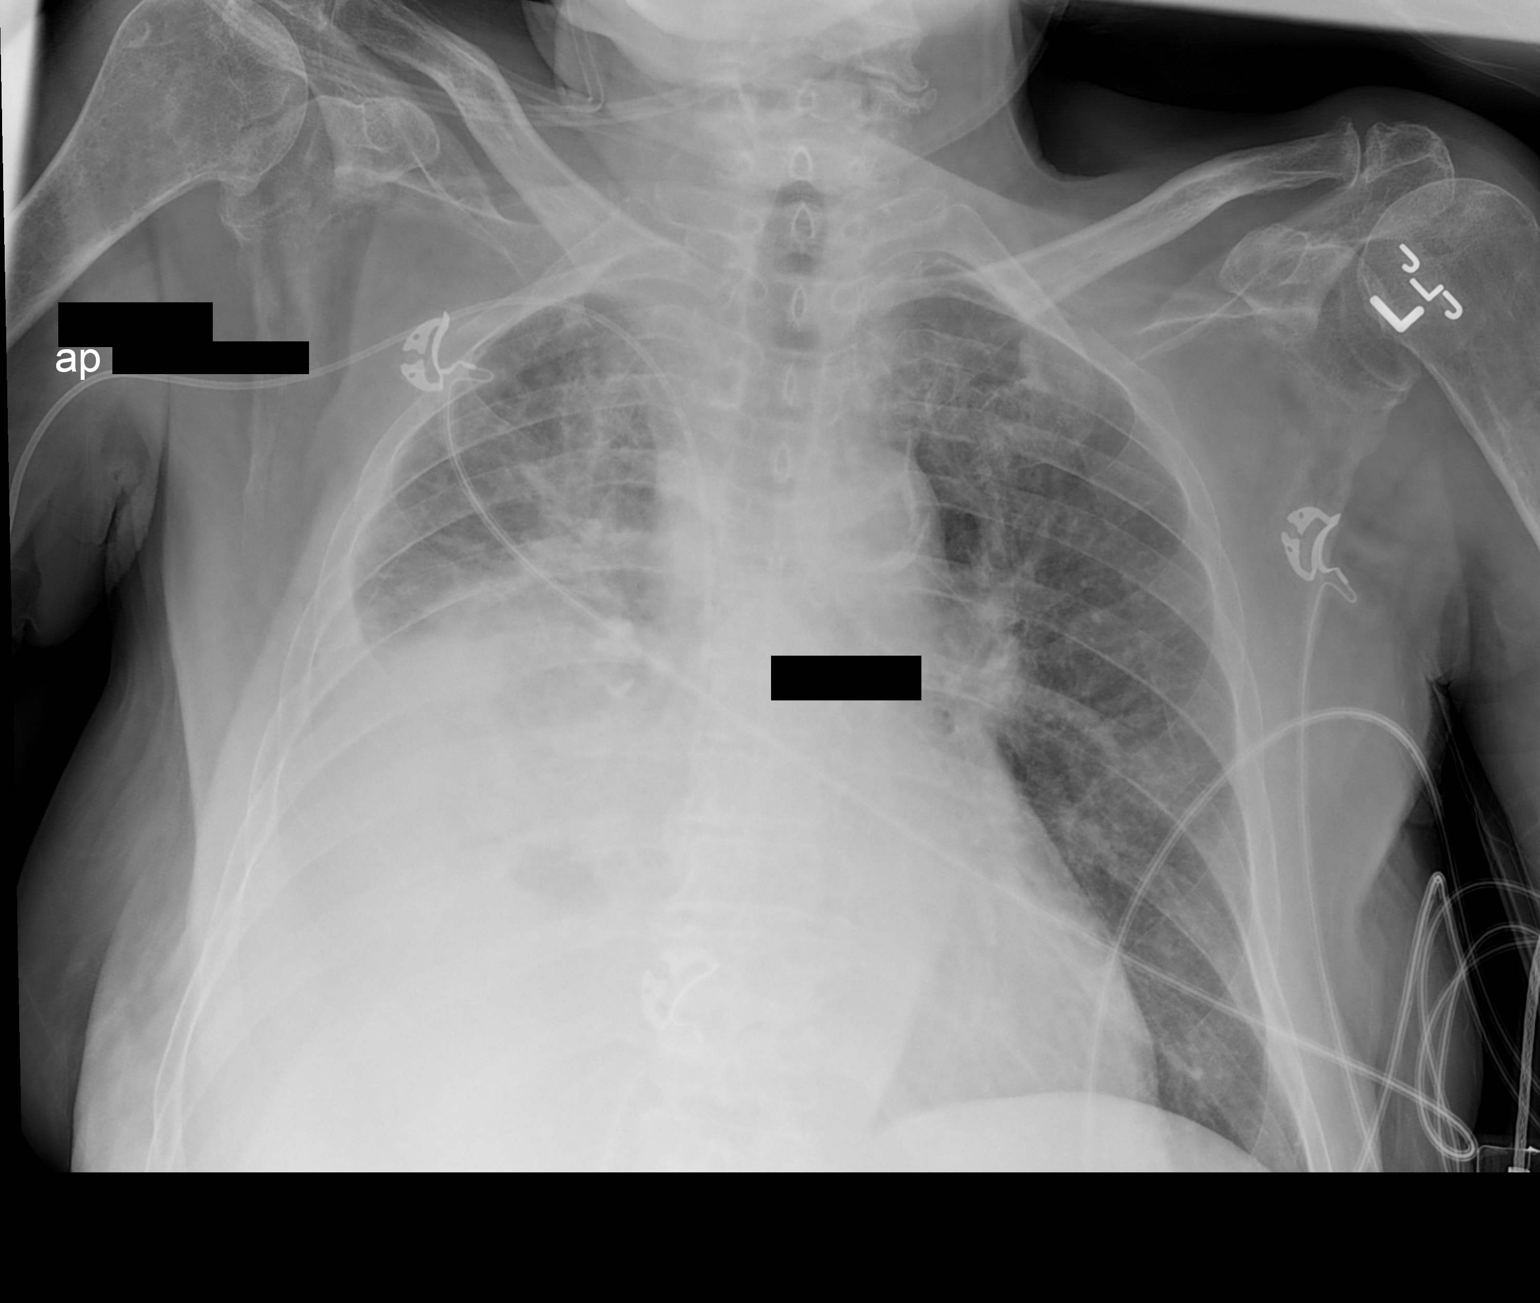

[1 of 1 positions shown; findings below may reference images not displayed]

FINDINGS: The central venous catheter is unchanged with tip near the
cavoatrial junction/ high right atrium. Cardiomediastinal silhouette
is unchanged, with persistent obscure aeration of the right heart
border. Thoracic aortic calcification is noted. Moderate right
pleural effusion does not appear significantly changed, with similar
appearance of underlying right lung atelectasis/ consolidation in
the setting of known right hilar and right lung tumor. Right hilar
calcification is again seen. Interstitial pulmonary edema has
largely resolved. No pneumothorax.
IMPRESSION: 1. Largely resolved interstitial edema.
2. Unchanged, moderate right pleural and right perihilar/right lower
lung opacity.

## 2016-05-12 IMAGING — DX DG ABD PORTABLE 1V
1 series · 1 of 1 positions shown · non-contrast
Comparison: Chest x-ray 11/08/2014, 11/05/2014, CT abdomen
11/06/2014, PET-CT 08/06/2014

CLINICAL DATA: 73-year-old female with a history of abdominal
distention.

EXAM:
PORTABLE ABDOMEN - 1 VIEW

[abdomen kub]
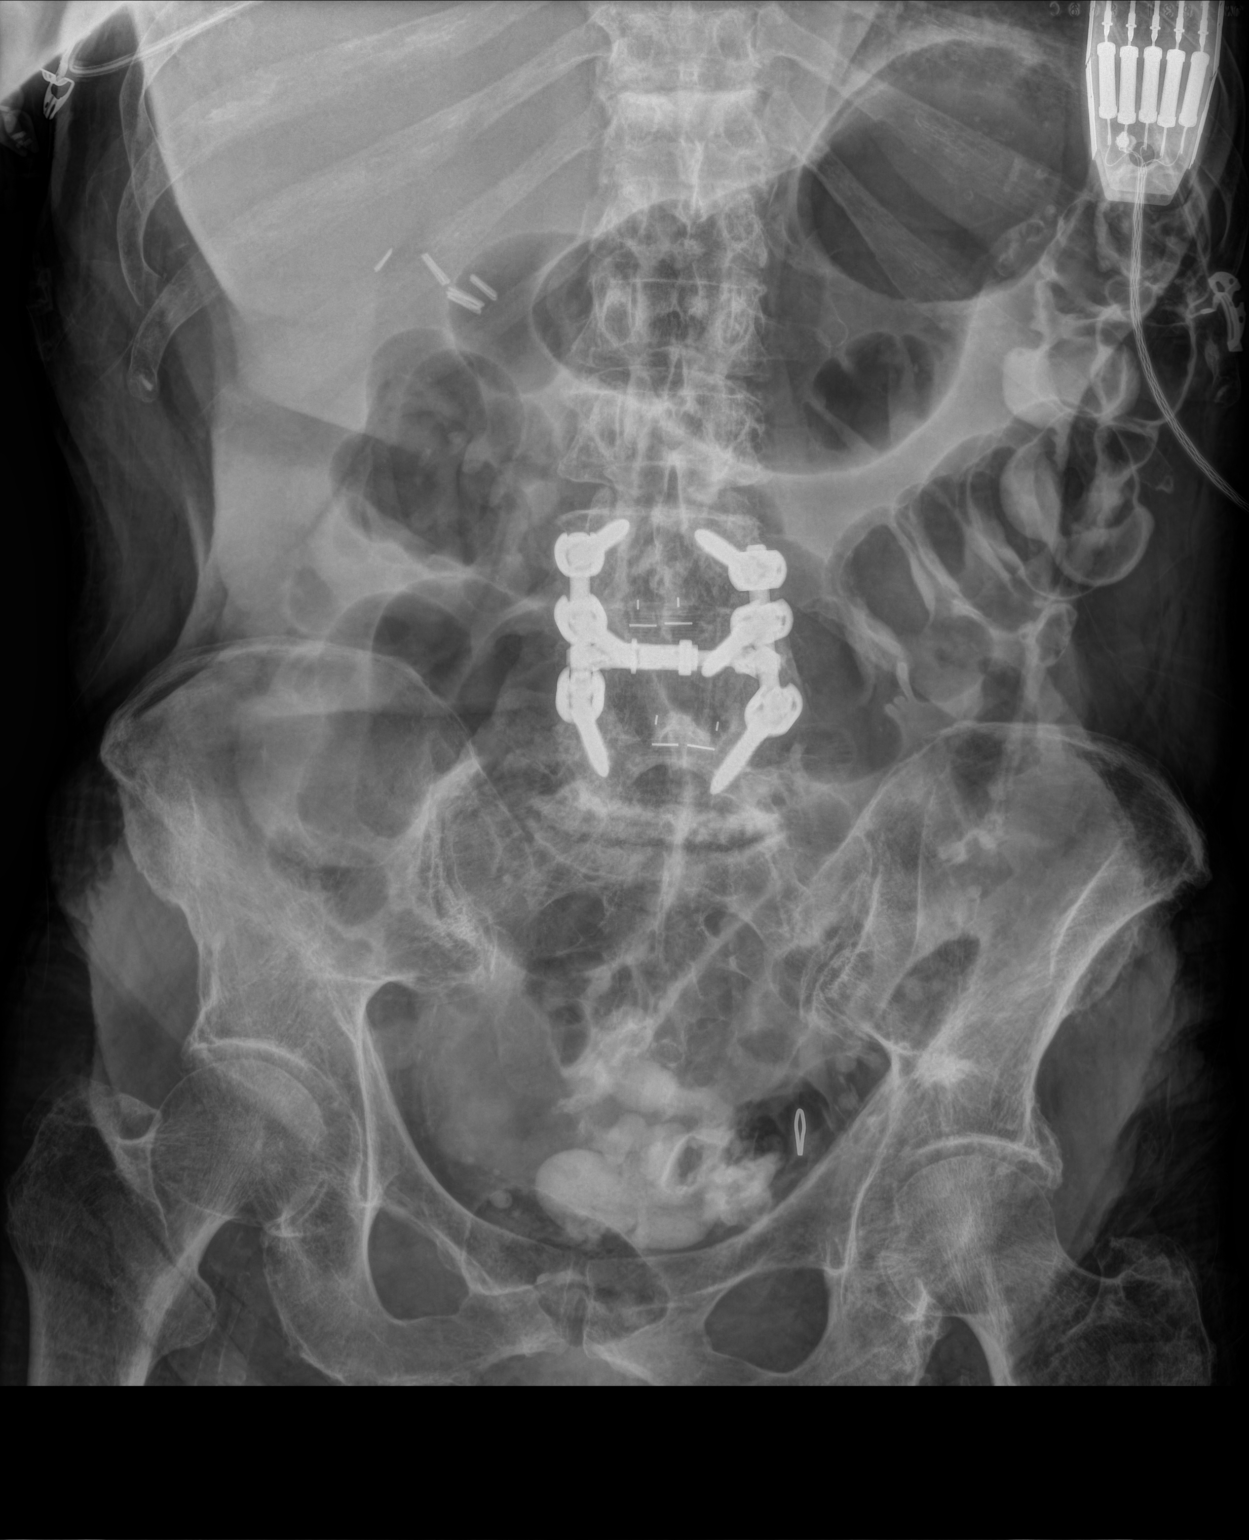

[1 of 1 positions shown; findings below may reference images not displayed]

FINDINGS: Gas within stomach, small bowel, and colon. Enteric contrast present
within the splenic flexure and rectum. Borderline dilated cecum
measures 7.5 cm.

Surgical changes of the right upper quadrant compatible with
cholecystectomy, left pelvic sidewall, and lumbar spine fixation.

Diffuse osteopenia.  No displaced fracture identified.

Extensive vascular calcifications.
IMPRESSION: Gas throughout GI system, with borderline dilated cecum. This could
be seen in the setting of ileus. Recommend following with interval
plain film until resolution. If there is concern for acute
abnormality, CT may be indicated.

## 2016-05-13 IMAGING — CT CT ABD-PELV W/O CM
2 of 5 series · 16 of 46 positions shown, 18 images · non-contrast
Comparison: 11/06/2014.

CLINICAL DATA: Abdominal distention. Diffuse abdominal pain with
generalized weakness. Enteritis on CT scan 11/06/2014.

EXAM:
CT ABDOMEN AND PELVIS WITHOUT CONTRAST
TECHNIQUE: Multidetector CT imaging of the abdomen and pelvis was performed
following the standard protocol without IV contrast.

[Series 2: rtn a/p w/o · axial · non-contrast · 0.71mm/px · z∈[-491,-131]mm · 13 of 82 slices shown, 15 images]
[im 5/82  soft-tissue]
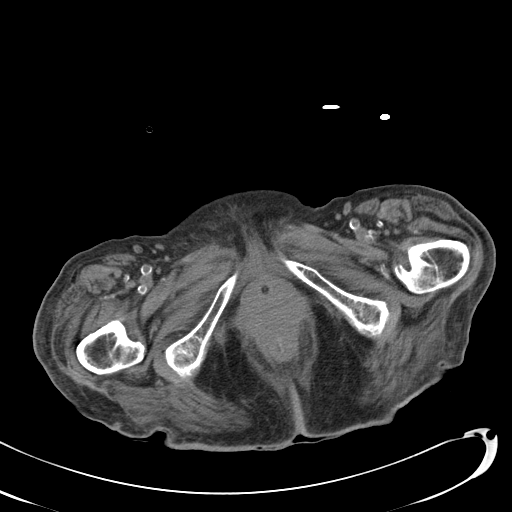
[im 5/82  bone]
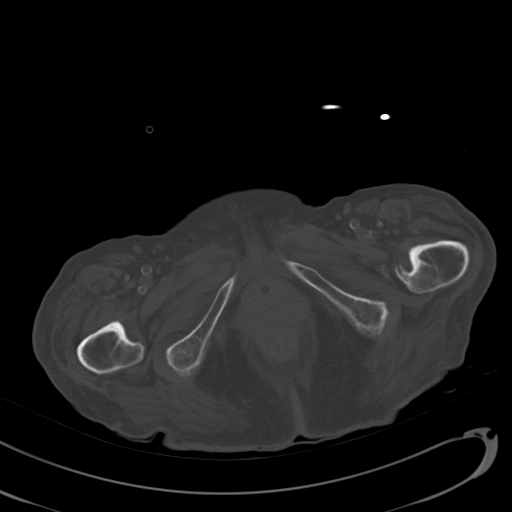
[im 13/82  soft-tissue]
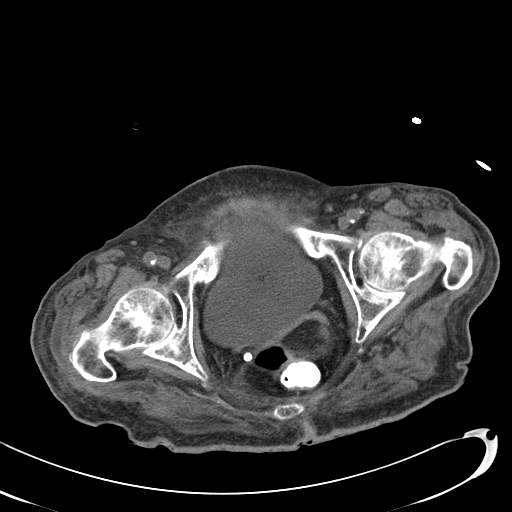
[im 18/82  soft-tissue]
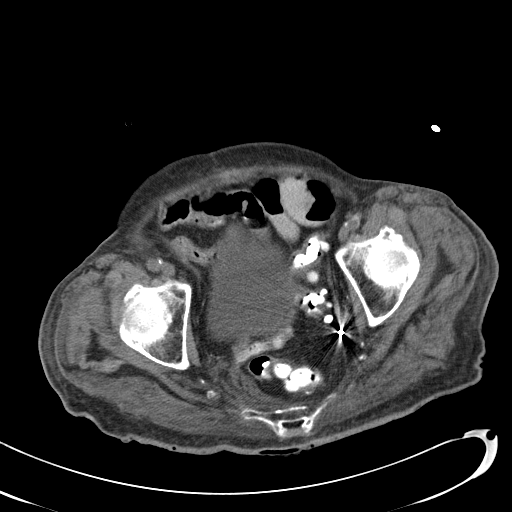
[im 22/82  soft-tissue]
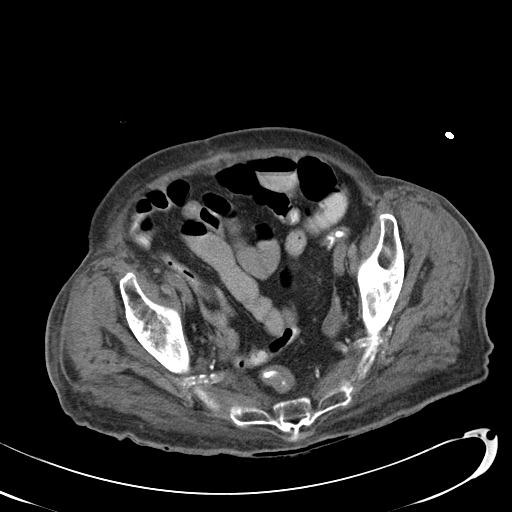
[im 30/82  soft-tissue]
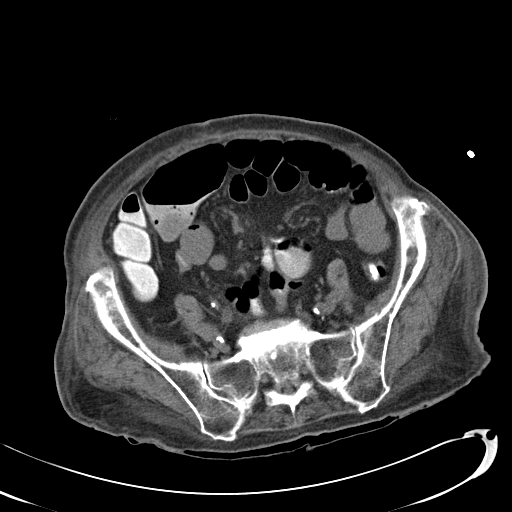
[im 35/82  soft-tissue]
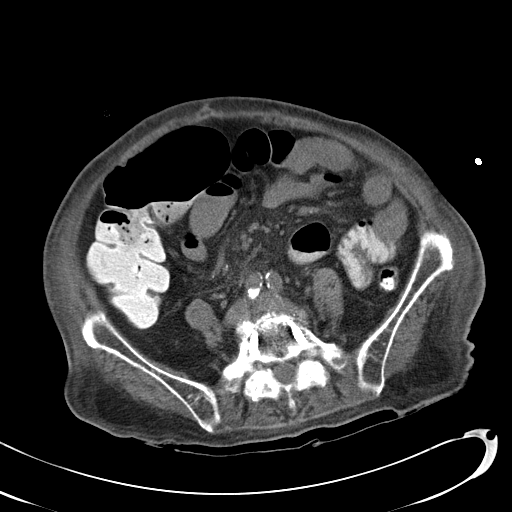
[im 43/82  soft-tissue]
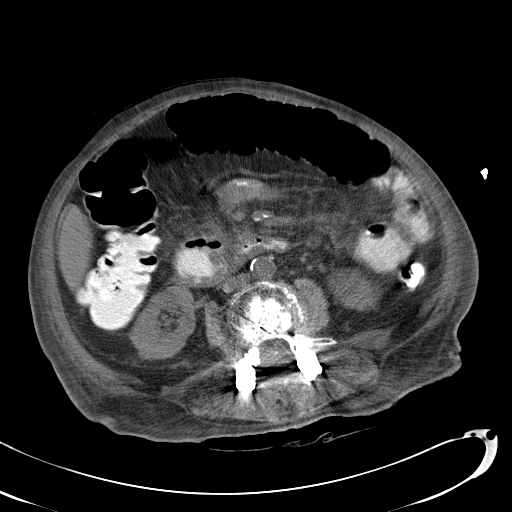
[im 47/82  soft-tissue]
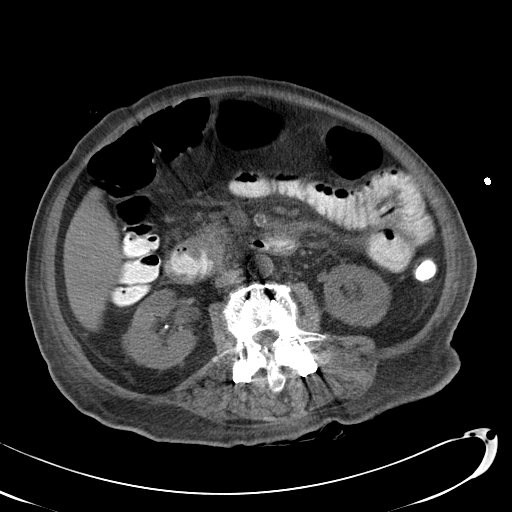
[im 52/82  soft-tissue]
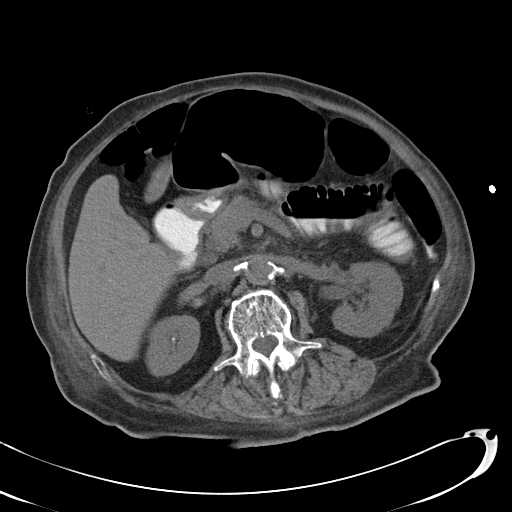
[im 52/82  bone]
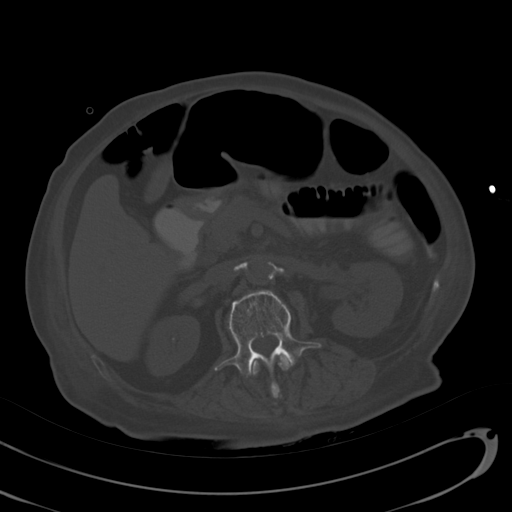
[im 60/82  soft-tissue]
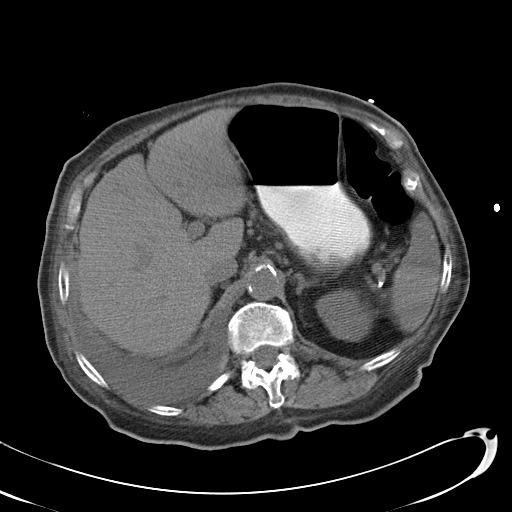
[im 64/82  soft-tissue]
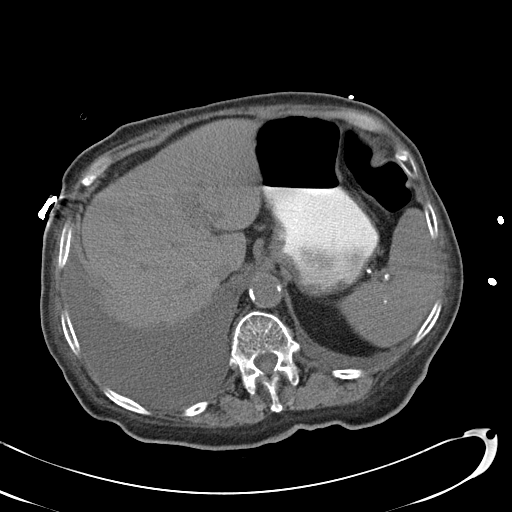
[im 69/82  soft-tissue]
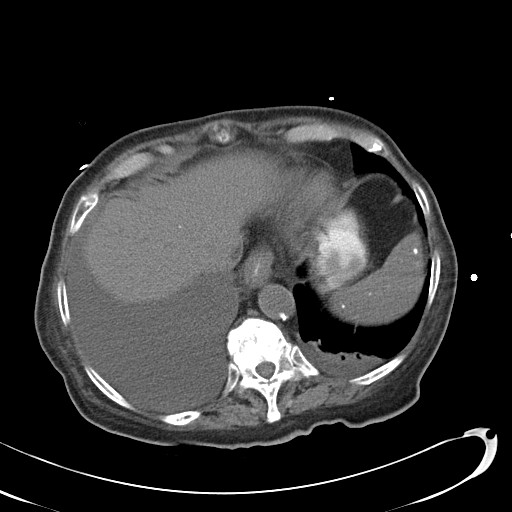
[im 77/82  soft-tissue]
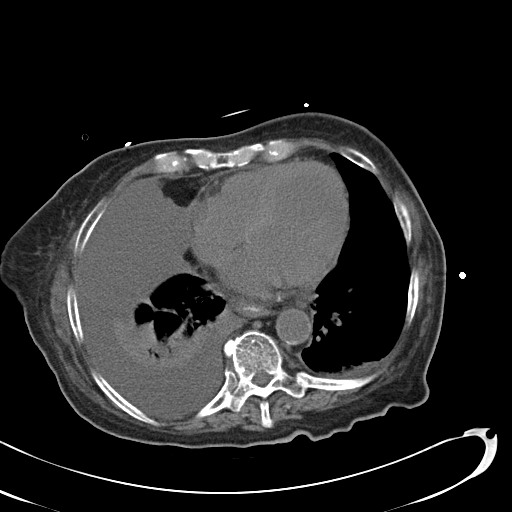

[Series 602: <mpr thick range> · coronal · 0.80mm/px · 3 of 155 slices shown]
[im 52/155  soft-tissue]
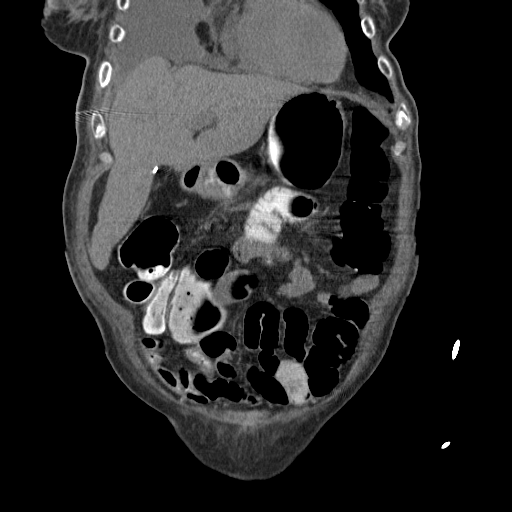
[im 69/155  soft-tissue]
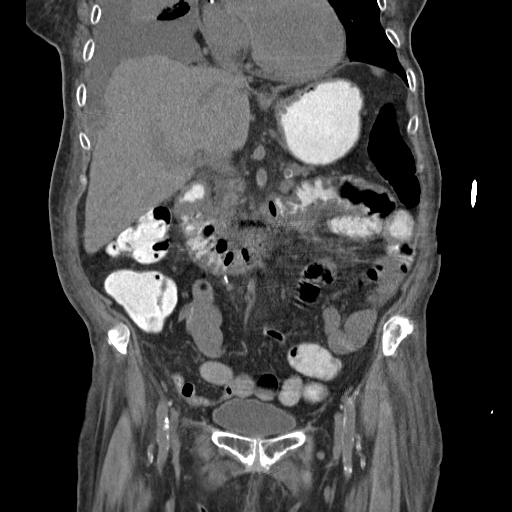
[im 86/155  soft-tissue]
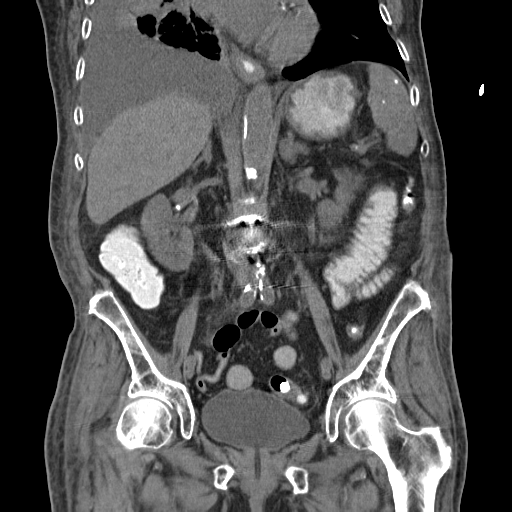

[16 of 46 positions shown; findings below may reference images not displayed]

FINDINGS: Moderate RIGHT pleural effusion with collapse/ consolidation of the
RIGHT middle and RIGHT lower lobe is similar to prior exam. Coronary
artery atherosclerosis is present. If office based assessment of
coronary risk factors has not been performed, it is now recommended.
Central line is present terminating at the cavoatrial junction. Low
attenuation of the intravascular compartment consistent with anemia.
Small LEFT pleural effusion with associated atelectasis.

Liver and spleen show old granulomatous disease. Cholecystectomy.
Pancreas and common bile duct are grossly normal. Renal hilar
calculi or vascular calcifications are present. No hydronephrosis.
Both ureters appear within normal limits.

Foley catheter in the urinary bladder. The bladder is distended.
LEFT adrenal nodule is unchanged compared to recent prior. Anasarca
is present.

Stomach is distended with oral contrast. Unchanged nonspecific mild
duodenal mural thickening in the third part of the duodenum. The
jejunal thickening has resolves and may have been due to
underdistention. Oral contrast is present in small bowel. No small
bowel obstruction. Oral contrast has progressed through the colon.
Contrast is present in the rectosigmoid.

Hysterectomy. Surgical clips in the LEFT anatomic pelvis. Lumbar
fixation hardware noted. T11 compression fracture is unchanged.
Sclerosis of the RIGHT L5 transverse process secondary to metastatic
disease.
IMPRESSION: 1. Resolution of jejunal loops thickening, probably due to
underdistention on the prior exam. Inflammatory changes of the
duodenum may be associated with enteric infection or peptic ulcer
disease. Given the recent therapy for small cell carcinoma,
treatment related enteritis is probably most likely. Metastatic
disease to the duodenum is considered less likely in this patient
with non-small cell lung carcinoma.
2. Moderate RIGHT pleural effusion with collapse/consolidation of
the RIGHT middle and RIGHT lower lobe.
3. Atherosclerosis and coronary artery disease. Old granulomatous
disease.
4. Unchanged LEFT adrenal nodule compatible with metastatic disease.
Bony metastatic disease unchanged.
